# Patient Record
Sex: Female | Born: 1975 | Race: Black or African American | Hispanic: No | Marital: Married | Smoking: Never smoker
Health system: Southern US, Community
[De-identification: ages and names within clinical notes are randomized; demographics above are authoritative.]

## PROBLEM LIST (undated history)

## (undated) DIAGNOSIS — R809 Proteinuria, unspecified: Secondary | ICD-10-CM

## (undated) DIAGNOSIS — I10 Essential (primary) hypertension: Secondary | ICD-10-CM

## (undated) DIAGNOSIS — N939 Abnormal uterine and vaginal bleeding, unspecified: Secondary | ICD-10-CM

## (undated) DIAGNOSIS — D259 Leiomyoma of uterus, unspecified: Secondary | ICD-10-CM

## (undated) DIAGNOSIS — E7800 Pure hypercholesterolemia, unspecified: Secondary | ICD-10-CM

## (undated) DIAGNOSIS — I161 Hypertensive emergency: Secondary | ICD-10-CM

## (undated) DIAGNOSIS — E70319 Ocular albinism, unspecified: Secondary | ICD-10-CM

## (undated) DIAGNOSIS — Z8249 Family history of ischemic heart disease and other diseases of the circulatory system: Secondary | ICD-10-CM

## (undated) DIAGNOSIS — R519 Headache, unspecified: Secondary | ICD-10-CM

## (undated) DIAGNOSIS — J302 Other seasonal allergic rhinitis: Secondary | ICD-10-CM

## (undated) DIAGNOSIS — G4730 Sleep apnea, unspecified: Secondary | ICD-10-CM

## (undated) DIAGNOSIS — R51 Headache: Secondary | ICD-10-CM

## (undated) DIAGNOSIS — J339 Nasal polyp, unspecified: Secondary | ICD-10-CM

## (undated) DIAGNOSIS — Z973 Presence of spectacles and contact lenses: Secondary | ICD-10-CM

## (undated) DIAGNOSIS — E785 Hyperlipidemia, unspecified: Secondary | ICD-10-CM

## (undated) DIAGNOSIS — Z9109 Other allergy status, other than to drugs and biological substances: Secondary | ICD-10-CM

## (undated) DIAGNOSIS — G4733 Obstructive sleep apnea (adult) (pediatric): Secondary | ICD-10-CM

## (undated) DIAGNOSIS — R7303 Prediabetes: Secondary | ICD-10-CM

## (undated) DIAGNOSIS — J320 Chronic maxillary sinusitis: Secondary | ICD-10-CM

## (undated) DIAGNOSIS — J4520 Mild intermittent asthma, uncomplicated: Secondary | ICD-10-CM

## (undated) DIAGNOSIS — J3089 Other allergic rhinitis: Secondary | ICD-10-CM

## (undated) HISTORY — PX: LIVER BIOPSY: SHX301

## (undated) HISTORY — DX: Hypertensive emergency: I16.1

## (undated) HISTORY — DX: Pure hypercholesterolemia, unspecified: E78.00

---

## 1998-05-15 ENCOUNTER — Encounter: Admission: RE | Admit: 1998-05-15 | Discharge: 1998-05-15 | Payer: Self-pay | Admitting: Sports Medicine

## 1998-05-26 ENCOUNTER — Encounter: Admission: RE | Admit: 1998-05-26 | Discharge: 1998-05-26 | Payer: Self-pay | Admitting: Family Medicine

## 1998-06-03 ENCOUNTER — Encounter: Admission: RE | Admit: 1998-06-03 | Discharge: 1998-06-03 | Payer: Self-pay | Admitting: Family Medicine

## 1998-08-07 ENCOUNTER — Inpatient Hospital Stay (HOSPITAL_COMMUNITY): Admission: AD | Admit: 1998-08-07 | Discharge: 1998-08-07 | Payer: Self-pay | Admitting: Obstetrics & Gynecology

## 1998-08-08 ENCOUNTER — Inpatient Hospital Stay (HOSPITAL_COMMUNITY): Admission: AD | Admit: 1998-08-08 | Discharge: 1998-08-08 | Payer: Self-pay | Admitting: Obstetrics

## 1998-08-12 ENCOUNTER — Ambulatory Visit (HOSPITAL_COMMUNITY): Admission: RE | Admit: 1998-08-12 | Discharge: 1998-08-12 | Payer: Self-pay | Admitting: Obstetrics & Gynecology

## 1998-08-12 ENCOUNTER — Encounter: Admission: RE | Admit: 1998-08-12 | Discharge: 1998-08-12 | Payer: Self-pay | Admitting: Obstetrics & Gynecology

## 1998-08-18 ENCOUNTER — Encounter: Admission: RE | Admit: 1998-08-18 | Discharge: 1998-08-18 | Payer: Self-pay | Admitting: Sports Medicine

## 1998-08-19 ENCOUNTER — Encounter: Admission: RE | Admit: 1998-08-19 | Discharge: 1998-08-19 | Payer: Self-pay | Admitting: Obstetrics & Gynecology

## 1998-08-26 ENCOUNTER — Encounter: Admission: RE | Admit: 1998-08-26 | Discharge: 1998-08-26 | Payer: Self-pay | Admitting: Family Medicine

## 1998-09-01 ENCOUNTER — Ambulatory Visit (HOSPITAL_COMMUNITY): Admission: RE | Admit: 1998-09-01 | Discharge: 1998-09-01 | Payer: Self-pay | Admitting: Obstetrics and Gynecology

## 1998-09-02 ENCOUNTER — Encounter: Admission: RE | Admit: 1998-09-02 | Discharge: 1998-09-02 | Payer: Self-pay | Admitting: Family Medicine

## 1998-09-08 ENCOUNTER — Encounter: Admission: RE | Admit: 1998-09-08 | Discharge: 1998-09-08 | Payer: Self-pay | Admitting: Sports Medicine

## 1998-09-11 ENCOUNTER — Observation Stay (HOSPITAL_COMMUNITY): Admission: AD | Admit: 1998-09-11 | Discharge: 1998-09-12 | Payer: Self-pay | Admitting: Obstetrics

## 1998-09-15 ENCOUNTER — Encounter: Admission: RE | Admit: 1998-09-15 | Discharge: 1998-09-15 | Payer: Self-pay | Admitting: Sports Medicine

## 1998-09-28 ENCOUNTER — Ambulatory Visit (HOSPITAL_COMMUNITY): Admission: RE | Admit: 1998-09-28 | Discharge: 1998-09-28 | Payer: Self-pay | Admitting: Obstetrics & Gynecology

## 1998-09-28 ENCOUNTER — Encounter: Admission: RE | Admit: 1998-09-28 | Discharge: 1998-09-28 | Payer: Self-pay | Admitting: Sports Medicine

## 1998-10-01 ENCOUNTER — Encounter: Admission: RE | Admit: 1998-10-01 | Discharge: 1998-10-01 | Payer: Self-pay | Admitting: Family Medicine

## 1998-10-06 ENCOUNTER — Encounter (HOSPITAL_COMMUNITY): Admission: RE | Admit: 1998-10-06 | Discharge: 1998-11-02 | Payer: Self-pay | Admitting: *Deleted

## 1998-10-09 ENCOUNTER — Encounter: Admission: RE | Admit: 1998-10-09 | Discharge: 1998-10-09 | Payer: Self-pay | Admitting: Family Medicine

## 1998-10-20 ENCOUNTER — Encounter: Admission: RE | Admit: 1998-10-20 | Discharge: 1998-10-20 | Payer: Self-pay | Admitting: Family Medicine

## 1998-10-20 ENCOUNTER — Encounter: Payer: Self-pay | Admitting: *Deleted

## 1998-10-20 ENCOUNTER — Inpatient Hospital Stay (HOSPITAL_COMMUNITY): Admission: AD | Admit: 1998-10-20 | Discharge: 1998-10-22 | Payer: Self-pay | Admitting: *Deleted

## 1998-10-24 HISTORY — PX: TUBAL LIGATION: SHX77

## 1998-10-28 ENCOUNTER — Inpatient Hospital Stay (HOSPITAL_COMMUNITY): Admission: AD | Admit: 1998-10-28 | Discharge: 1998-11-02 | Payer: Self-pay | Admitting: Obstetrics

## 1998-12-15 ENCOUNTER — Encounter: Admission: RE | Admit: 1998-12-15 | Discharge: 1998-12-15 | Payer: Self-pay | Admitting: Sports Medicine

## 1999-01-07 ENCOUNTER — Other Ambulatory Visit: Admission: RE | Admit: 1999-01-07 | Discharge: 1999-01-07 | Payer: Self-pay | Admitting: *Deleted

## 1999-01-07 ENCOUNTER — Encounter: Admission: RE | Admit: 1999-01-07 | Discharge: 1999-01-07 | Payer: Self-pay | Admitting: Family Medicine

## 1999-04-13 ENCOUNTER — Encounter: Admission: RE | Admit: 1999-04-13 | Discharge: 1999-04-13 | Payer: Self-pay | Admitting: Family Medicine

## 1999-06-17 ENCOUNTER — Encounter: Admission: RE | Admit: 1999-06-17 | Discharge: 1999-06-17 | Payer: Self-pay | Admitting: Family Medicine

## 1999-08-21 ENCOUNTER — Encounter: Payer: Self-pay | Admitting: Emergency Medicine

## 1999-08-21 ENCOUNTER — Emergency Department (HOSPITAL_COMMUNITY): Admission: EM | Admit: 1999-08-21 | Discharge: 1999-08-21 | Payer: Self-pay | Admitting: Emergency Medicine

## 1999-08-23 ENCOUNTER — Encounter: Admission: RE | Admit: 1999-08-23 | Discharge: 1999-08-23 | Payer: Self-pay | Admitting: Family Medicine

## 1999-08-30 ENCOUNTER — Encounter: Admission: RE | Admit: 1999-08-30 | Discharge: 1999-08-30 | Payer: Self-pay | Admitting: Family Medicine

## 1999-12-02 ENCOUNTER — Emergency Department (HOSPITAL_COMMUNITY): Admission: EM | Admit: 1999-12-02 | Discharge: 1999-12-02 | Payer: Self-pay | Admitting: Emergency Medicine

## 2000-11-14 ENCOUNTER — Emergency Department (HOSPITAL_COMMUNITY): Admission: EM | Admit: 2000-11-14 | Discharge: 2000-11-14 | Payer: Self-pay | Admitting: Emergency Medicine

## 2001-01-09 ENCOUNTER — Encounter: Admission: RE | Admit: 2001-01-09 | Discharge: 2001-01-09 | Payer: Self-pay | Admitting: Family Medicine

## 2001-02-09 ENCOUNTER — Emergency Department (HOSPITAL_COMMUNITY): Admission: EM | Admit: 2001-02-09 | Discharge: 2001-02-09 | Payer: Self-pay | Admitting: Emergency Medicine

## 2001-02-27 ENCOUNTER — Encounter: Admission: RE | Admit: 2001-02-27 | Discharge: 2001-02-27 | Payer: Self-pay | Admitting: Family Medicine

## 2001-02-27 ENCOUNTER — Other Ambulatory Visit: Admission: RE | Admit: 2001-02-27 | Discharge: 2001-02-27 | Payer: Self-pay | Admitting: Obstetrics & Gynecology

## 2001-02-27 ENCOUNTER — Ambulatory Visit (HOSPITAL_COMMUNITY): Admission: RE | Admit: 2001-02-27 | Discharge: 2001-02-27 | Payer: Self-pay | Admitting: Family Medicine

## 2001-05-18 ENCOUNTER — Encounter: Admission: RE | Admit: 2001-05-18 | Discharge: 2001-05-18 | Payer: Self-pay | Admitting: Family Medicine

## 2001-06-15 ENCOUNTER — Encounter: Admission: RE | Admit: 2001-06-15 | Discharge: 2001-06-15 | Payer: Self-pay | Admitting: Family Medicine

## 2001-08-27 ENCOUNTER — Encounter: Admission: RE | Admit: 2001-08-27 | Discharge: 2001-08-27 | Payer: Self-pay | Admitting: Family Medicine

## 2001-08-27 ENCOUNTER — Other Ambulatory Visit: Admission: RE | Admit: 2001-08-27 | Discharge: 2001-08-27 | Payer: Self-pay | Admitting: Family Medicine

## 2001-08-27 ENCOUNTER — Encounter (INDEPENDENT_AMBULATORY_CARE_PROVIDER_SITE_OTHER): Payer: Self-pay

## 2002-02-07 ENCOUNTER — Encounter: Admission: RE | Admit: 2002-02-07 | Discharge: 2002-02-07 | Payer: Self-pay | Admitting: Family Medicine

## 2002-03-04 ENCOUNTER — Encounter: Admission: RE | Admit: 2002-03-04 | Discharge: 2002-03-04 | Payer: Self-pay | Admitting: Family Medicine

## 2002-03-05 ENCOUNTER — Encounter: Admission: RE | Admit: 2002-03-05 | Discharge: 2002-03-05 | Payer: Self-pay | Admitting: Family Medicine

## 2002-03-20 ENCOUNTER — Encounter: Admission: RE | Admit: 2002-03-20 | Discharge: 2002-03-20 | Payer: Self-pay | Admitting: Family Medicine

## 2002-05-09 ENCOUNTER — Encounter: Admission: RE | Admit: 2002-05-09 | Discharge: 2002-05-09 | Payer: Self-pay | Admitting: Family Medicine

## 2002-06-20 ENCOUNTER — Encounter: Admission: RE | Admit: 2002-06-20 | Discharge: 2002-06-20 | Payer: Self-pay | Admitting: Family Medicine

## 2003-01-16 ENCOUNTER — Encounter: Admission: RE | Admit: 2003-01-16 | Discharge: 2003-01-16 | Payer: Self-pay | Admitting: Family Medicine

## 2003-01-27 ENCOUNTER — Emergency Department (HOSPITAL_COMMUNITY): Admission: EM | Admit: 2003-01-27 | Discharge: 2003-01-27 | Payer: Self-pay | Admitting: Emergency Medicine

## 2003-02-14 ENCOUNTER — Encounter: Admission: RE | Admit: 2003-02-14 | Discharge: 2003-02-14 | Payer: Self-pay | Admitting: Family Medicine

## 2003-04-21 ENCOUNTER — Encounter: Admission: RE | Admit: 2003-04-21 | Discharge: 2003-04-21 | Payer: Self-pay | Admitting: Sports Medicine

## 2003-04-21 ENCOUNTER — Other Ambulatory Visit: Admission: RE | Admit: 2003-04-21 | Discharge: 2003-04-21 | Payer: Self-pay | Admitting: Family Medicine

## 2003-04-21 ENCOUNTER — Encounter (INDEPENDENT_AMBULATORY_CARE_PROVIDER_SITE_OTHER): Payer: Self-pay | Admitting: *Deleted

## 2003-05-08 ENCOUNTER — Encounter: Payer: Self-pay | Admitting: Sports Medicine

## 2003-05-08 ENCOUNTER — Encounter: Admission: RE | Admit: 2003-05-08 | Discharge: 2003-05-08 | Payer: Self-pay | Admitting: Sports Medicine

## 2003-05-13 ENCOUNTER — Encounter: Admission: RE | Admit: 2003-05-13 | Discharge: 2003-05-13 | Payer: Self-pay | Admitting: Family Medicine

## 2003-05-19 ENCOUNTER — Ambulatory Visit (HOSPITAL_COMMUNITY): Admission: RE | Admit: 2003-05-19 | Discharge: 2003-05-19 | Payer: Self-pay | Admitting: *Deleted

## 2003-07-29 ENCOUNTER — Encounter (INDEPENDENT_AMBULATORY_CARE_PROVIDER_SITE_OTHER): Payer: Self-pay | Admitting: *Deleted

## 2003-07-29 ENCOUNTER — Other Ambulatory Visit: Admission: RE | Admit: 2003-07-29 | Discharge: 2003-07-29 | Payer: Self-pay | Admitting: Family Medicine

## 2003-07-29 ENCOUNTER — Encounter: Admission: RE | Admit: 2003-07-29 | Discharge: 2003-07-29 | Payer: Self-pay | Admitting: Family Medicine

## 2003-07-29 ENCOUNTER — Encounter (INDEPENDENT_AMBULATORY_CARE_PROVIDER_SITE_OTHER): Payer: Self-pay | Admitting: Specialist

## 2003-10-21 ENCOUNTER — Encounter: Admission: RE | Admit: 2003-10-21 | Discharge: 2003-10-21 | Payer: Self-pay | Admitting: Family Medicine

## 2003-12-17 ENCOUNTER — Encounter: Admission: RE | Admit: 2003-12-17 | Discharge: 2003-12-17 | Payer: Self-pay | Admitting: Family Medicine

## 2004-03-24 ENCOUNTER — Encounter: Admission: RE | Admit: 2004-03-24 | Discharge: 2004-03-24 | Payer: Self-pay | Admitting: Family Medicine

## 2004-03-25 ENCOUNTER — Encounter: Admission: RE | Admit: 2004-03-25 | Discharge: 2004-03-25 | Payer: Self-pay | Admitting: Sports Medicine

## 2004-08-12 ENCOUNTER — Ambulatory Visit: Payer: Self-pay | Admitting: Family Medicine

## 2004-10-13 ENCOUNTER — Ambulatory Visit: Payer: Self-pay | Admitting: Sports Medicine

## 2005-04-11 ENCOUNTER — Emergency Department (HOSPITAL_COMMUNITY): Admission: EM | Admit: 2005-04-11 | Discharge: 2005-04-11 | Payer: Self-pay | Admitting: Emergency Medicine

## 2005-06-02 ENCOUNTER — Emergency Department (HOSPITAL_COMMUNITY): Admission: EM | Admit: 2005-06-02 | Discharge: 2005-06-02 | Payer: Self-pay | Admitting: Family Medicine

## 2005-06-10 ENCOUNTER — Ambulatory Visit: Payer: Self-pay | Admitting: Family Medicine

## 2005-07-01 ENCOUNTER — Ambulatory Visit: Payer: Self-pay | Admitting: Family Medicine

## 2005-07-07 ENCOUNTER — Ambulatory Visit (HOSPITAL_COMMUNITY): Admission: RE | Admit: 2005-07-07 | Discharge: 2005-07-07 | Payer: Self-pay | Admitting: Sports Medicine

## 2005-11-04 ENCOUNTER — Ambulatory Visit: Payer: Self-pay | Admitting: Sports Medicine

## 2005-11-25 ENCOUNTER — Ambulatory Visit: Payer: Self-pay | Admitting: Family Medicine

## 2005-11-30 ENCOUNTER — Encounter (INDEPENDENT_AMBULATORY_CARE_PROVIDER_SITE_OTHER): Payer: Self-pay | Admitting: *Deleted

## 2005-11-30 LAB — CONVERTED CEMR LAB

## 2005-12-09 ENCOUNTER — Ambulatory Visit: Payer: Self-pay | Admitting: Sports Medicine

## 2006-03-12 ENCOUNTER — Emergency Department (HOSPITAL_COMMUNITY): Admission: EM | Admit: 2006-03-12 | Discharge: 2006-03-12 | Payer: Self-pay | Admitting: Family Medicine

## 2006-03-15 ENCOUNTER — Emergency Department (HOSPITAL_COMMUNITY): Admission: EM | Admit: 2006-03-15 | Discharge: 2006-03-15 | Payer: Self-pay | Admitting: Emergency Medicine

## 2006-05-09 ENCOUNTER — Ambulatory Visit: Payer: Self-pay | Admitting: Sports Medicine

## 2006-06-11 ENCOUNTER — Emergency Department (HOSPITAL_COMMUNITY): Admission: AD | Admit: 2006-06-11 | Discharge: 2006-06-11 | Payer: Self-pay | Admitting: Family Medicine

## 2006-07-17 ENCOUNTER — Ambulatory Visit: Payer: Self-pay | Admitting: Family Medicine

## 2006-07-19 ENCOUNTER — Ambulatory Visit: Payer: Self-pay | Admitting: Family Medicine

## 2006-09-28 ENCOUNTER — Ambulatory Visit: Payer: Self-pay | Admitting: Sports Medicine

## 2006-10-20 ENCOUNTER — Ambulatory Visit: Payer: Self-pay | Admitting: Family Medicine

## 2006-12-21 DIAGNOSIS — I10 Essential (primary) hypertension: Secondary | ICD-10-CM | POA: Insufficient documentation

## 2006-12-22 ENCOUNTER — Encounter (INDEPENDENT_AMBULATORY_CARE_PROVIDER_SITE_OTHER): Payer: Self-pay | Admitting: *Deleted

## 2007-03-22 ENCOUNTER — Telehealth: Payer: Self-pay | Admitting: *Deleted

## 2007-03-22 ENCOUNTER — Ambulatory Visit: Payer: Self-pay | Admitting: Family Medicine

## 2007-03-24 ENCOUNTER — Emergency Department (HOSPITAL_COMMUNITY): Admission: EM | Admit: 2007-03-24 | Discharge: 2007-03-24 | Payer: Self-pay | Admitting: Family Medicine

## 2007-03-25 ENCOUNTER — Emergency Department (HOSPITAL_COMMUNITY): Admission: EM | Admit: 2007-03-25 | Discharge: 2007-03-25 | Payer: Self-pay | Admitting: Emergency Medicine

## 2007-03-26 ENCOUNTER — Telehealth (INDEPENDENT_AMBULATORY_CARE_PROVIDER_SITE_OTHER): Payer: Self-pay | Admitting: Family Medicine

## 2007-04-17 ENCOUNTER — Ambulatory Visit: Payer: Self-pay | Admitting: Sports Medicine

## 2007-12-13 ENCOUNTER — Encounter (INDEPENDENT_AMBULATORY_CARE_PROVIDER_SITE_OTHER): Payer: Self-pay | Admitting: Family Medicine

## 2007-12-13 ENCOUNTER — Ambulatory Visit: Payer: Self-pay | Admitting: Family Medicine

## 2007-12-13 LAB — CONVERTED CEMR LAB
Chlamydia, DNA Probe: NEGATIVE
GC Probe Amp, Genital: NEGATIVE
Whiff Test: POSITIVE

## 2007-12-14 ENCOUNTER — Telehealth (INDEPENDENT_AMBULATORY_CARE_PROVIDER_SITE_OTHER): Payer: Self-pay | Admitting: Family Medicine

## 2007-12-19 ENCOUNTER — Encounter (INDEPENDENT_AMBULATORY_CARE_PROVIDER_SITE_OTHER): Payer: Self-pay | Admitting: Family Medicine

## 2008-01-03 ENCOUNTER — Telehealth (INDEPENDENT_AMBULATORY_CARE_PROVIDER_SITE_OTHER): Payer: Self-pay | Admitting: Family Medicine

## 2008-02-04 ENCOUNTER — Telehealth (INDEPENDENT_AMBULATORY_CARE_PROVIDER_SITE_OTHER): Payer: Self-pay | Admitting: Family Medicine

## 2008-05-08 ENCOUNTER — Telehealth (INDEPENDENT_AMBULATORY_CARE_PROVIDER_SITE_OTHER): Payer: Self-pay | Admitting: Family Medicine

## 2008-09-09 ENCOUNTER — Observation Stay (HOSPITAL_COMMUNITY): Admission: EM | Admit: 2008-09-09 | Discharge: 2008-09-10 | Payer: Self-pay | Admitting: Emergency Medicine

## 2008-09-24 ENCOUNTER — Ambulatory Visit: Payer: Self-pay | Admitting: Family Medicine

## 2008-09-24 DIAGNOSIS — Z9189 Other specified personal risk factors, not elsewhere classified: Secondary | ICD-10-CM | POA: Insufficient documentation

## 2008-11-11 ENCOUNTER — Encounter (INDEPENDENT_AMBULATORY_CARE_PROVIDER_SITE_OTHER): Payer: Self-pay | Admitting: Family Medicine

## 2008-11-11 ENCOUNTER — Ambulatory Visit: Payer: Self-pay | Admitting: Family Medicine

## 2008-11-12 LAB — CONVERTED CEMR LAB
AST: 13 units/L (ref 0–37)
Albumin: 4.2 g/dL (ref 3.5–5.2)
CO2: 20 meq/L (ref 19–32)
Calcium: 8.8 mg/dL (ref 8.4–10.5)
Total Bilirubin: 0.2 mg/dL — ABNORMAL LOW (ref 0.3–1.2)
Total Protein: 7 g/dL (ref 6.0–8.3)

## 2008-12-21 ENCOUNTER — Emergency Department (HOSPITAL_COMMUNITY): Admission: EM | Admit: 2008-12-21 | Discharge: 2008-12-21 | Payer: Self-pay | Admitting: Emergency Medicine

## 2009-02-06 ENCOUNTER — Ambulatory Visit: Payer: Self-pay | Admitting: Family Medicine

## 2009-02-06 DIAGNOSIS — M25569 Pain in unspecified knee: Secondary | ICD-10-CM | POA: Insufficient documentation

## 2009-02-06 DIAGNOSIS — S7010XA Contusion of unspecified thigh, initial encounter: Secondary | ICD-10-CM | POA: Insufficient documentation

## 2009-05-04 ENCOUNTER — Telehealth: Payer: Self-pay | Admitting: Family Medicine

## 2009-05-21 ENCOUNTER — Ambulatory Visit: Payer: Self-pay | Admitting: Family Medicine

## 2009-05-21 DIAGNOSIS — E663 Overweight: Secondary | ICD-10-CM | POA: Insufficient documentation

## 2009-05-21 DIAGNOSIS — D4959 Neoplasm of unspecified behavior of other genitourinary organ: Secondary | ICD-10-CM | POA: Insufficient documentation

## 2009-05-22 ENCOUNTER — Encounter: Payer: Self-pay | Admitting: *Deleted

## 2009-06-01 ENCOUNTER — Ambulatory Visit: Payer: Self-pay | Admitting: Family Medicine

## 2009-07-27 ENCOUNTER — Encounter: Payer: Self-pay | Admitting: Family Medicine

## 2009-07-27 ENCOUNTER — Telehealth: Payer: Self-pay | Admitting: Family Medicine

## 2009-07-27 ENCOUNTER — Ambulatory Visit: Payer: Self-pay | Admitting: Family Medicine

## 2009-07-27 DIAGNOSIS — N751 Abscess of Bartholin's gland: Secondary | ICD-10-CM | POA: Insufficient documentation

## 2009-07-28 ENCOUNTER — Ambulatory Visit: Payer: Self-pay | Admitting: Family Medicine

## 2009-07-29 ENCOUNTER — Encounter: Payer: Self-pay | Admitting: *Deleted

## 2009-08-21 ENCOUNTER — Ambulatory Visit: Payer: Self-pay | Admitting: Family Medicine

## 2010-03-12 ENCOUNTER — Telehealth: Payer: Self-pay | Admitting: Family Medicine

## 2010-03-16 ENCOUNTER — Ambulatory Visit: Payer: Self-pay | Admitting: Family Medicine

## 2010-03-16 ENCOUNTER — Telehealth: Payer: Self-pay | Admitting: Family Medicine

## 2010-03-16 ENCOUNTER — Ambulatory Visit (HOSPITAL_COMMUNITY): Admission: RE | Admit: 2010-03-16 | Discharge: 2010-03-16 | Payer: Self-pay | Admitting: Family Medicine

## 2010-03-16 ENCOUNTER — Encounter: Payer: Self-pay | Admitting: Family Medicine

## 2010-03-16 DIAGNOSIS — R51 Headache: Secondary | ICD-10-CM | POA: Insufficient documentation

## 2010-03-16 DIAGNOSIS — R519 Headache, unspecified: Secondary | ICD-10-CM | POA: Insufficient documentation

## 2010-03-16 LAB — CONVERTED CEMR LAB
Chloride: 101 meq/L (ref 96–112)
Potassium: 3.3 meq/L — ABNORMAL LOW (ref 3.5–5.3)

## 2010-03-17 ENCOUNTER — Encounter: Payer: Self-pay | Admitting: Family Medicine

## 2010-04-15 ENCOUNTER — Telehealth: Payer: Self-pay | Admitting: Family Medicine

## 2010-04-16 ENCOUNTER — Emergency Department (HOSPITAL_COMMUNITY): Admission: EM | Admit: 2010-04-16 | Discharge: 2010-04-16 | Payer: Self-pay | Admitting: Family Medicine

## 2010-07-25 ENCOUNTER — Encounter: Payer: Self-pay | Admitting: Family Medicine

## 2010-08-09 ENCOUNTER — Ambulatory Visit: Payer: Self-pay | Admitting: Family Medicine

## 2010-08-26 ENCOUNTER — Encounter: Payer: Self-pay | Admitting: Family Medicine

## 2010-08-26 ENCOUNTER — Ambulatory Visit: Payer: Self-pay | Admitting: Obstetrics & Gynecology

## 2010-11-25 NOTE — Assessment & Plan Note (Signed)
Summary: cyst/[place]   Vital Signs:  Patient profile:   35 year old female Weight:      150.3 pounds BMI:     28.97 Temp:     97.6 degrees F Pulse rate:   74 / minute BP sitting:   148 / 104  (left arm)  Vitals Entered By: Starleen Blue RN (July 27, 2009 10:13 AM) CC: cyst Is Patient Diabetic? Yes  Pain Assessment Patient in pain? yes     Location: vaginal Intensity: 7   Primary Care Provider:  Ardeen Garland  MD  CC:  cyst.  History of Present Illness: Jillian Vasquez is a 35 year old female presenting with c/o of:  1. Vaginal Cyst: on her left inner labia minora, intermittent, seems to occur mid-menstrual cycle and lasts approx 3 days, then usually completely resolves.  this is the 4th episode this year. she states that wearing scented maxi pads and using fragrant bath soap seems to make the area worse.  motrin and using summer's eve bath wash seems to help.  today, the lesion is painful and she is unable to sit normally, it has been tender for 2-3 days.   Habits & Providers  Alcohol-Tobacco-Diet     Tobacco Status: never  Current Medications (verified): 1)  Hydrochlorothiazide 25 Mg Tabs (Hydrochlorothiazide) .... Take 1 Tablet By Mouth Once A Day 2)  Norvasc 10 Mg Tabs (Amlodipine Besylate) .... Take 1 Tablet By Mouth Once A Day 3)  Tramadol Hcl 50 Mg  Tabs (Tramadol Hcl) .... One By Mouth Q 4-6 Hours As Needed Pain  Allergies (verified): No Known Drug Allergies PMH-FH-SH reviewed for relevance  Review of Systems GU:  Denies discharge, dysuria, genital sores, hematuria, incontinence, urinary frequency, and urinary hesitancy.  Physical Exam  General:  Well-developed,well-nourished,in no acute distress; alert,appropriate and cooperative throughout examination. Vitals reviewed. Genitalia:  2-3 cm red, painful to palpation, fluctuant mass on left labia minora c/w bartholin gland cyst/abscess           Impression & Recommendations:  Problem # 1:  BARTHOLIN'S CYST  ABSCESS (ICD-616.3) Assessment New  Word catheter insertion: procedure discussed with patient including benefits (draining pus and providing relief) and risks (bleeding, infection). Consent form obtained. No allergies endorsed. Patient placed in dorsal lithotomy position. Area cleansed with betadine. Local anesthesia was obtained using 2 cc 1% lidocaine without epi.  ~5 mm incision made medial side of cyst. green-yellow pus released. word cath placed in usual manner into cyst cavity, filled with H2O to allow retention of ballon. the patient tolerated the procedure well. aftercare instructions given along with pain medication. f/u 2 days. refer to GYN for more permanent fix.  Orders: I&D Abcess, simple- [facility] (10060) [facility]- Est  Level 4 (04540)  Problem # 2:  HYPERTENSION, BENIGN SYSTEMIC (ICD-401.1) Assessment: Unchanged  No change in medication today. Patient in pain. Will f/u with PCP. Her updated medication list for this problem includes:    Hydrochlorothiazide 25 Mg Tabs (Hydrochlorothiazide) .Marland Kitchen... Take 1 tablet by mouth once a day    Norvasc 10 Mg Tabs (Amlodipine besylate) .Marland Kitchen... Take 1 tablet by mouth once a day  Orders: [facility]- Est  Level 4 (98119)  Complete Medication List: 1)  Hydrochlorothiazide 25 Mg Tabs (Hydrochlorothiazide) .... Take 1 tablet by mouth once a day 2)  Norvasc 10 Mg Tabs (Amlodipine besylate) .... Take 1 tablet by mouth once a day 3)  Tramadol Hcl 50 Mg Tabs (Tramadol hcl) .... One by mouth q 4-6 hours as  needed pain  Patient Instructions: 1)  It was nice to meet you today. 2)  Please follow up in 2 days to see if the catheter may be removed. 3)  You may shower daily as usual.  4)  Use a maxi-pad since there will be discharge from the catheter. Prescriptions: TRAMADOL HCL 50 MG  TABS (TRAMADOL HCL) one by mouth q 4-6 hours as needed pain  #30 x 0   Entered and Authorized by:   Helane Rima MD   Signed by:   Helane Rima MD on 07/27/2009   Method used:   Print  then Give to Patient   RxID:   [id number] PERCOCET 5-325 MG TABS (OXYCODONE-ACETAMINOPHEN) one by mouth q 4-6 hours as needed pain  #30 x 0   Entered and Authorized by:   Helane Rima MD   Signed by:   Helane Rima MD on 07/27/2009   Method used:   Print then Give to Patient   RxID:   [id number]   Prevention & Chronic Care Immunizations   Influenza vaccine: Not documented    Tetanus booster: 11/24/2002: Done.   Tetanus booster due: 11/24/2012    Pneumococcal vaccine: Not documented  Other Screening   Pap smear: normal  (12/13/2007)   Pap smear due: 12/12/2008   Smoking status: never  (07/27/2009)  Hypertension   Last Blood Pressure: 148 / 104  (07/27/2009)   Serum creatinine: 0.71  (11/11/2008)   Serum potassium 4.5  (11/11/2008)    Hypertension flowsheet reviewed?: Yes   Progress toward BP goal: Unchanged  Self-Management Support :   Personal Goals (by the next clinic visit) :      Personal blood pressure goal: 140/90  (07/27/2009)   Patient will work on the following items until the next clinic visit to reach self-care goals:     Medications and monitoring: take my medicines every day  (07/27/2009)    Hypertension self-management support: Written self-care plan  (07/27/2009)   Hypertension self-care plan printed.

## 2010-11-25 NOTE — Miscellaneous (Signed)
Summary: problem list update  Clinical Lists Changes  Problems: Removed problem of EXERCISE INDUCED ASTHMA (ICD-493.81)      Allergies: No Known Drug Allergies

## 2010-11-25 NOTE — Progress Notes (Signed)
Summary: triage  Phone Note Call from Patient Call back at Home Phone [phone]   Caller: Patient Summary of Call: Pt has outbreak of shingles. Initial call taken by: Clydell Hakim,  April 15, 2010 4:39 PM  Follow-up for Phone Call        hx of shingles per pt. states she went to [facility] last yr. located on back of leg. told her no appt now. asked her about appt tomorrow. she wants to go back to UC. told her ok Follow-up by: Golden Circle RN,  April 15, 2010 4:41 PM
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
## 2010-11-25 NOTE — Progress Notes (Signed)
Summary: triage  Phone Note Call from Patient Call back at Home Phone [phone]   Caller: Patient Summary of Call: Pt got generic of Norvas and usually gets name brand.  Not only is this making her feel bad the price was more expensive getting the generic.   Initial call taken by: Clydell Hakim,  Mar 16, 2010 9:40 AM  Follow-up for Phone Call        to pcp  Follow-up by: Golden Circle RN,  Mar 16, 2010 10:03 AM  Additional Follow-up for Phone Call Additional follow up Details #1::        Not sure why that is.  Most likely should have been getting generic all along as I never checked brand medically necessary.  Also, is on discount list at [facility] and [facility] but not [facility].  I can resend it to another pharmacy once but she needs appt anywway before more refills.  Additional Follow-up by: Lamar Laundry, MD May 24th, 2011    Additional Follow-up for Phone Call Additional follow up Details #2::    she is not taking that med because of the way it makes her feel. appt at 1:30  today with Dr. Lelon Perla for HTN & meds Follow-up by: Golden Circle RN,  Mar 16, 2010 11:07 AM
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
## 2010-11-25 NOTE — Progress Notes (Signed)
Summary: refill  Phone Note Refill Request Call back at Home Phone [phone] Message from:  Patient  Refills Requested: Medication #1:  NORVASC 10 MG TABS Take 1 tablet by mouth once a day   Supply Requested: 3 months  Medication #2:  HYDROCHLOROTHIAZIDE 25 MG TABS Take 1 tablet by mouth once a day   Supply Requested: 3 months [facility] [address]  Initial call taken by: De Nurse,  Mar 12, 2010 2:23 PM  Follow-up for Phone Call        Sent but patient needs appointment for BP check before further refills can be sent.  Follow-up by: Lamar Laundry, MD May 20th, 2011  Additional Follow-up for Phone Call Additional follow up Details #1::        spoke w/ pt and she will call Monday to make appt. thank you! Additional Follow-up by: De Nurse,  Mar 12, 2010 2:32 PM    Prescriptions: HYDROCHLOROTHIAZIDE 25 MG TABS (HYDROCHLOROTHIAZIDE) Take 1 tablet by mouth once a day  #31 x 1   Entered and Authorized by:   Ardeen Garland  MD   Signed by:   Ardeen Garland  MD on 03/12/2010   Method used:   Electronically to        [facility] [phone]* (retail)       [address]       [place], [place]  [address]       Ph: [phone]       Fax: [phone]   RxID:   [id number] NORVASC 10 MG TABS (AMLODIPINE BESYLATE) Take 1 tablet by mouth once a day  #31 x 1   Entered and Authorized by:   Ardeen Garland  MD   Signed by:   Ardeen Garland  MD on 03/12/2010   Method used:   Electronically to        [facility] [phone]* (retail)       [address]       [place], [place]  [address]       Ph: [phone]       Fax: [phone]   RxID:   [id number]

## 2010-11-25 NOTE — Consult Note (Signed)
Summary: [facility] - clinic note  [facility] - clinic note   Imported By: De Nurse 11/19/2010 15:39:43  _____________________________________________________________________  External Attachment:    Type:   Image     Comment:   External Document

## 2010-11-30 ENCOUNTER — Encounter: Payer: Self-pay | Admitting: *Deleted

## 2011-02-24 ENCOUNTER — Ambulatory Visit (INDEPENDENT_AMBULATORY_CARE_PROVIDER_SITE_OTHER): Payer: Self-pay | Admitting: Family Medicine

## 2011-02-24 ENCOUNTER — Encounter: Payer: Self-pay | Admitting: Family Medicine

## 2011-02-24 VITALS — BP 190/100 | HR 82 | Temp 98.3°F | Ht 61.0 in | Wt 150.6 lb

## 2011-02-24 DIAGNOSIS — I10 Essential (primary) hypertension: Secondary | ICD-10-CM

## 2011-02-24 DIAGNOSIS — J40 Bronchitis, not specified as acute or chronic: Secondary | ICD-10-CM | POA: Insufficient documentation

## 2011-02-24 MED ORDER — ALBUTEROL 90 MCG/ACT IN AERS: 2.0000 | INHALATION_SPRAY | RESPIRATORY_TRACT | Status: DC | PRN

## 2011-02-24 MED ORDER — DOXYCYCLINE HYCLATE 100 MG PO TABS: 100.0000 mg | ORAL_TABLET | Freq: Two times a day (BID) | ORAL | Status: AC

## 2011-02-24 MED ORDER — CLONIDINE HCL 0.1 MG PO TABS
0.2000 mg | ORAL_TABLET | Freq: Once | ORAL | Status: AC
  Administered 2011-02-24: 0.2 mg via ORAL

## 2011-02-24 MED ORDER — LISINOPRIL-HYDROCHLOROTHIAZIDE 20-25 MG PO TABS: 1.0000 | ORAL_TABLET | Freq: Every day | ORAL | Status: DC

## 2011-02-24 NOTE — Assessment & Plan Note (Addendum)
Will start pt on HCTZ/lisinopril combo, needs two meds  RTC for  recheck on Monday Given Clonidine during visit, pt assymptmatic today, will allow outpatient management, see above

## 2011-02-24 NOTE — Progress Notes (Signed)
  Subjective:    Patient ID: Jillian Vasquez, female    DOB: [birth year], 35 y.o.   MRN: [id number]  HPI  Productive cough x 2 week, green sputum, moving around the house has wheezing and feels SOB, in the past had episodes of wheezing but this resolved, non smoker   No sore throat, occ allergies, subjective fever and chills, denies nausea, vomiting, no abd pain, has pleuritic chest pain at night with coughing spells. Taking HP coricidin   No sick contacts   HTN- BP running high  150-160s/ 110-120 for the past few weeks- Lisinpril only, was told HCTZ was discontinued  Occ HA, non currently, no change in vision  Review of Systems per above     Objective:   Physical Exam GEN- NAD, alert and oriented  HEENT- Fundoscopic exam benign, perrl, EOMI, oropharynx clear  CVS- RRR, no murmur  RESP- decreased left base compared to right, normal WOB, no wheeze, no rhonchi       Assessment & Plan:

## 2011-02-24 NOTE — Patient Instructions (Signed)
Start the antibiotic doxycyline- take 1 tablet in AM and 1 in pm for 7 days Use the inhaler as needed  For SOB  for your blood pressure- start the combination pill of lisinopril and HCTZ Return for a recheck on Monday with the work-in doc or Dr. Edmonia James, or myself

## 2011-02-24 NOTE — Assessment & Plan Note (Addendum)
Will treat prolonged symptoms for bronchitis esp with wheezing associated , likley started as viral illness, works as Oncologist Doxy x 7 days- needs meds on discount list Albuterol inhaler

## 2011-03-01 ENCOUNTER — Ambulatory Visit: Payer: Self-pay | Admitting: *Deleted

## 2011-03-01 VITALS — BP 165/105 | HR 71

## 2011-03-01 DIAGNOSIS — I10 Essential (primary) hypertension: Secondary | ICD-10-CM
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
## 2011-07-16 ENCOUNTER — Telehealth: Payer: Self-pay | Admitting: Family Medicine

## 2011-07-16 NOTE — Telephone Encounter (Signed)
Patient has had runny nose, colored nasal discharge, and sneezing 1-2 weeks.  But no cough, difficulty breathing, fevers, nausea/vomiting.   Wondering what she could take for her symptoms with her history of hypertension.   Has tried decongestant (?Alka-Seltzer plus). Helped some but still persistent symptoms.   Seems to be having seasonal allergies. Does not appear to have infection. Recommended OTC anti-histamine Allegra or Claritin. If symptoms not improved, recommend following-up at clinic.

## 2011-07-26 LAB — POCT I-STAT, CHEM 8
Calcium, Ion: 1.15
Chloride: 102
Creatinine, Ser: 1.2
Glucose, Bld: 119 — ABNORMAL HIGH
HCT: 39

## 2011-07-26 LAB — SAMPLE TO BLOOD BANK

## 2011-07-26 LAB — URINALYSIS, ROUTINE W REFLEX MICROSCOPIC
Glucose, UA: NEGATIVE
Hgb urine dipstick: NEGATIVE
Protein, ur: NEGATIVE
Specific Gravity, Urine: >1.046 — ABNORMAL HIGH
pH: 6.5

## 2011-07-26 LAB — DIFFERENTIAL
Eosinophils Relative: 4
Lymphocytes Relative: 46
Lymphs Abs: 3
Monocytes Absolute: 0.5
Monocytes Relative: 7

## 2011-07-26 LAB — POCT PREGNANCY, URINE: Preg Test, Ur: NEGATIVE

## 2011-07-26 LAB — CBC
HCT: 36
Hemoglobin: 11.7 — ABNORMAL LOW
RBC: 5
WBC: 6.7

## 2011-08-11 LAB — POCT I-STAT CREATININE
Creatinine, Ser: 1.3 — ABNORMAL HIGH
Operator id: 277751

## 2011-08-11 LAB — I-STAT 8, (EC8 V) (CONVERTED LAB)
Acid-Base Excess: 2
Chloride: 103
Glucose, Bld: 99
pCO2, Ven: 44.2 — ABNORMAL LOW
pH, Ven: 7.405 — ABNORMAL HIGH

## 2011-10-05 ENCOUNTER — Ambulatory Visit (INDEPENDENT_AMBULATORY_CARE_PROVIDER_SITE_OTHER): Payer: PRIVATE HEALTH INSURANCE | Admitting: Family Medicine

## 2011-10-05 ENCOUNTER — Encounter: Payer: Self-pay | Admitting: Family Medicine

## 2011-10-05 VITALS — BP 168/102 | HR 76 | Temp 97.8°F | Ht 61.0 in | Wt 148.6 lb

## 2011-10-05 DIAGNOSIS — J019 Acute sinusitis, unspecified: Secondary | ICD-10-CM | POA: Insufficient documentation

## 2011-10-05 MED ORDER — AZITHROMYCIN 500 MG PO TABS: 500.0000 mg | ORAL_TABLET | Freq: Every day | ORAL | Status: AC

## 2011-10-05 NOTE — Progress Notes (Signed)
  Subjective:    Patient ID: Jillian Vasquez, female    DOB: August 07, 1976, 35 y.o.   MRN: [id number]  HPI Nasal congestion congestion and cough: Patient reports nasal congestion and sinus pressure and subjective fevers x3 weeks. Some improvement after her symptoms have been present x1 week and then had a worsening. Off and on subjective fevers. Off and on night sweats chills. X3 days has had cough and wheezing. Using albuterol inhaler every 4 hours. Continues to feel short of breath and wheezing. Has history of exercise-induced asthma. No sore throat. No nausea. No vomiting. No diarrhea. Eating well. Drinking well. Positive sick contacts.  Review of Systems As per above.    Objective:   Physical Exam  Constitutional: She is oriented to person, place, and time. She appears well-developed and well-nourished.  HENT:  Head: Normocephalic and atraumatic.       Erythematous/inflamed nasal mucosa. Clear nasal discharge. Mild tenderness with palpation over frontal and maxillary sinuses.  Eyes: Right eye exhibits no discharge. Left eye exhibits no discharge.  Neck: Normal range of motion. Neck supple.  Cardiovascular: Normal rate, regular rhythm and normal heart sounds.   No murmur heard. Pulmonary/Chest: Effort normal. No respiratory distress. She has wheezes (wheezing bilateral).  Musculoskeletal: She exhibits no edema.  Neurological: She is alert and oriented to person, place, and time.  Skin: No rash noted.  Psychiatric: She has a normal mood and affect. Her behavior is normal.          Assessment & Plan:

## 2011-10-05 NOTE — Assessment & Plan Note (Addendum)
Since patient reports subjective fevers, symptoms x3 weeks, nasal congestion, sinus pressure/pain, history of a second worsening-Will prescribe antibiotics at this time. Wheezing bilateral most likely do to asthma flare from URI. Antibiotics given for sinusitis will also cover for bacterial cause. Patient to use albuterol every 4 hours as needed. Patient to return for new or worsening symptoms.

## 2011-10-22 ENCOUNTER — Emergency Department (HOSPITAL_COMMUNITY)
Admission: EM | Admit: 2011-10-22 | Discharge: 2011-10-22 | Disposition: A | Discharge status: Home / Self Care | Payer: PRIVATE HEALTH INSURANCE | Attending: Emergency Medicine | Admitting: Emergency Medicine

## 2011-10-22 ENCOUNTER — Encounter (HOSPITAL_COMMUNITY): Payer: Self-pay | Admitting: *Deleted

## 2011-10-22 ENCOUNTER — Emergency Department (HOSPITAL_COMMUNITY): Payer: PRIVATE HEALTH INSURANCE

## 2011-10-22 DIAGNOSIS — R059 Cough, unspecified: Secondary | ICD-10-CM | POA: Insufficient documentation

## 2011-10-22 DIAGNOSIS — Z79899 Other long term (current) drug therapy: Secondary | ICD-10-CM | POA: Insufficient documentation

## 2011-10-22 DIAGNOSIS — R05 Cough: Secondary | ICD-10-CM | POA: Insufficient documentation

## 2011-10-22 DIAGNOSIS — J45901 Unspecified asthma with (acute) exacerbation: Secondary | ICD-10-CM

## 2011-10-22 DIAGNOSIS — I10 Essential (primary) hypertension: Secondary | ICD-10-CM | POA: Insufficient documentation

## 2011-10-22 DIAGNOSIS — J3489 Other specified disorders of nose and nasal sinuses: Secondary | ICD-10-CM | POA: Insufficient documentation

## 2011-10-22 HISTORY — DX: Essential (primary) hypertension: I10

## 2011-10-22 MED ORDER — ALBUTEROL SULFATE (5 MG/ML) 0.5% IN NEBU
INHALATION_SOLUTION | RESPIRATORY_TRACT | Status: AC
  Administered 2011-10-22: 5 mg via RESPIRATORY_TRACT
  Filled 2011-10-22: qty 1

## 2011-10-22 MED ORDER — ALBUTEROL SULFATE (5 MG/ML) 0.5% IN NEBU
5.0000 mg | INHALATION_SOLUTION | Freq: Once | RESPIRATORY_TRACT | Status: AC
  Administered 2011-10-22: 5 mg via RESPIRATORY_TRACT

## 2011-10-22 MED ORDER — ALBUTEROL SULFATE (5 MG/ML) 0.5% IN NEBU: 2.5000 mg | INHALATION_SOLUTION | Freq: Once | RESPIRATORY_TRACT | Status: DC

## 2011-10-22 MED ORDER — IPRATROPIUM BROMIDE 0.02 % IN SOLN
RESPIRATORY_TRACT | Status: AC
  Administered 2011-10-22: 0.5 mg via RESPIRATORY_TRACT
  Filled 2011-10-22: qty 2.5

## 2011-10-22 MED ORDER — PREDNISONE 20 MG PO TABS: 60.0000 mg | ORAL_TABLET | Freq: Once | ORAL | Status: DC

## 2011-10-22 MED ORDER — IPRATROPIUM BROMIDE 0.02 % IN SOLN: 0.5000 mg | Freq: Once | RESPIRATORY_TRACT | Status: DC

## 2011-10-22 MED ORDER — PREDNISONE 10 MG PO TABS: 20.0000 mg | ORAL_TABLET | Freq: Every day | ORAL | Status: DC

## 2011-10-22 MED ORDER — PREDNISONE 20 MG PO TABS
60.0000 mg | ORAL_TABLET | Freq: Once | ORAL | Status: AC
  Administered 2011-10-22: 60 mg via ORAL
  Filled 2011-10-22: qty 3

## 2011-10-22 MED ORDER — IPRATROPIUM BROMIDE 0.02 % IN SOLN
0.5000 mg | Freq: Once | RESPIRATORY_TRACT | Status: AC
  Administered 2011-10-22: 0.5 mg via RESPIRATORY_TRACT

## 2011-10-22 NOTE — ED Notes (Signed)
Pt began experiencing shortness of breath and asthma exacerbation approximately 1 hour prior to triage.  Pt noted to have a O2 saturation of 90-91 upon arrival with a HR of 120's.  Pt given albuterol and atrovent in triage after checking allergies.  O2 saturations immediately 98-99% with treatment.

## 2011-10-22 NOTE — ED Provider Notes (Addendum)
History     CSN: [id number]  Arrival date & time 10/22/11  2059   First MD Initiated Contact with Patient 10/22/11 2241      Chief Complaint  Patient presents with  . Asthma    (Consider location/radiation/quality/duration/timing/severity/associated sxs/prior treatment) HPI Comments: [name] has been diagnosed with asthma for the past 8 and has been using albuterol inhaler with relief up until 2 weeks ago, and she's found, that she's had to use it almost every 2 hours.  She has seen her primary care physician during this two-week period.  He just suggested she keep using her albuterol inhaler.  He did not place her on steroids.  She does report that she has URI without fever  Patient is a 35 y.o. female presenting with asthma. The history is provided by the patient.  Asthma This is a recurrent problem. The current episode started in the past 7 days. The problem occurs intermittently. The problem has been gradually worsening. Associated symptoms include congestion and coughing. Pertinent negatives include no chest pain, chills, diaphoresis, fever, headaches, myalgias, nausea, sore throat, vomiting or weakness. The symptoms are aggravated by exertion. The treatment provided no relief.    Past Medical History  Diagnosis Date  . Asthma   . Hypertension     History reviewed. No pertinent past surgical history.  History reviewed. No pertinent family history.  History  Substance Use Topics  . Smoking status: Never Smoker   . Smokeless tobacco: Not on file  . Alcohol Use: Not on file    OB History    Grav Para Term Preterm Abortions TAB SAB Ect Mult Living                  Review of Systems  Constitutional: Negative for fever, chills and diaphoresis.  HENT: Positive for congestion and rhinorrhea. Negative for sore throat.   Respiratory: Positive for cough, chest tightness, shortness of breath and wheezing.   Cardiovascular: Negative for chest pain.  Gastrointestinal:  Negative for nausea and vomiting.  Genitourinary: Negative for dysuria.  Musculoskeletal: Negative for myalgias.  Neurological: Negative for dizziness, weakness and headaches.  Hematological: Negative.   Psychiatric/Behavioral: Negative.     Allergies  Review of patient's allergies indicates no known allergies.  Home Medications   Current Outpatient Rx  Name Route Sig Dispense Refill  . ALBUTEROL 90 MCG/ACT IN AERS Inhalation Inhale 2 puffs into the lungs every 4 (four) hours as needed for wheezing. 17 g 12  . LISINOPRIL-HYDROCHLOROTHIAZIDE 20-25 MG PO TABS Oral Take 1 tablet by mouth daily. 30 tablet 11  . PREDNISONE 10 MG PO TABS Oral Take 2 tablets (20 mg total) by mouth daily. 15 tablet 0    BP 189/117  Pulse 88  Temp(Src) 98.7 F (37.1 C) (Oral)  Resp 20  SpO2 99%  LMP 10/01/2011  Physical Exam  Constitutional: She is oriented to person, place, and time. She appears well-developed and well-nourished.  HENT:  Head: Normocephalic.  Neck: Normal range of motion.  Cardiovascular: Tachycardia present.   Pulmonary/Chest: Tachypnea noted. No respiratory distress. She has no decreased breath sounds. She has wheezes in the right upper field, the right middle field, the left upper field and the left middle field. She has no rales. She exhibits no tenderness.  Abdominal: Soft.  Musculoskeletal: Normal range of motion.  Neurological: She is alert and oriented to person, place, and time.  Skin: Skin is warm and dry.    ED Course  Procedures (including critical  care time)  Labs Reviewed - No data to display No results found.   1. Asthma exacerbation   2. Hypertension, uncontrolled       MDM  Discussed at length use of albuterol inhaler.  Important fact that she should speak with her physician and report the frequency of use over the last several months.  We'll assess for pneumonia ordered steroid by mouth additional albuterol treatment        Arman Filter,  NP 10/22/11 2315  Arman Filter, NP 10/22/11 2343  Arman Filter, NP 10/27/11 1958

## 2011-10-22 NOTE — ED Provider Notes (Signed)
History     CSN: [id number]  Arrival date & time 10/22/11  2059   First MD Initiated Contact with Patient 10/22/11 2241      Chief Complaint  Patient presents with  . Asthma    (Consider location/radiation/quality/duration/timing/severity/associated sxs/prior treatment) HPI Patient with hsitory of cough and rhinnorhea for a month.  Using albuterol 4 times per day.  First episode of wheezing last year with uri.  No fever, weight loss, or vomiting.  Patient denies pregnancy with regular periods s/p btl.  Patient states she has not been taking her bp meds regularly due to being busy.   Past Medical History  Diagnosis Date  . Asthma   . Hypertension     History reviewed. No pertinent past surgical history.  History reviewed. No pertinent family history.  History  Substance Use Topics  . Smoking status: Never Smoker   . Smokeless tobacco: Not on file  . Alcohol Use: Not on file    OB History    Grav Para Term Preterm Abortions TAB SAB Ect Mult Living                  Review of Systems  All other systems reviewed and are negative.    Allergies  Review of patient's allergies indicates no known allergies.  Home Medications   Current Outpatient Rx  Name Route Sig Dispense Refill  . ALBUTEROL 90 MCG/ACT IN AERS Inhalation Inhale 2 puffs into the lungs every 4 (four) hours as needed for wheezing. 17 g 12  . LISINOPRIL-HYDROCHLOROTHIAZIDE 20-25 MG PO TABS Oral Take 1 tablet by mouth daily. 30 tablet 11    BP 195/119  Pulse 120  Temp(Src) 98.7 F (37.1 C) (Oral)  Resp 25  SpO2 100%  LMP 10/01/2011  Physical Exam  Nursing note and vitals reviewed. Constitutional: She is oriented to person, place, and time. She appears well-developed and well-nourished.       Patient hypertensive.  HENT:  Head: Normocephalic.  Eyes: Conjunctivae and EOM are normal. Pupils are equal, round, and reactive to light.  Neck: Normal range of motion. Neck supple.  Cardiovascular:  Normal rate and regular rhythm.   Pulmonary/Chest: Effort normal. No accessory muscle usage. Not tachypneic. No respiratory distress.       Few scattered rhonchi diffusely and some expiratory wheezing.  Abdominal: Soft. Normal appearance and bowel sounds are normal.  Musculoskeletal: Normal range of motion. She exhibits no edema and no tenderness.  Neurological: She is alert and oriented to person, place, and time.  Skin: Skin is warm and dry. She is not diaphoretic.  Psychiatric: She has a normal mood and affect.    ED Course  Procedures (including critical care time)  Labs Reviewed - No data to display No results found.   No diagnosis found.    MDM  Plan albuterol and atrovent here.  Will rx refill for neb and prednisone.  Patient's hr 90 here.  Patient instructed regarding bp management and promises she will take her antihypertensives regularly.  Discussed with patient and family extensively.      Hilario Quarry, MD 10/25/11 [phone]

## 2011-10-25 NOTE — ED Provider Notes (Signed)
History/physical exam/procedure(s) were performed by non-physician practitioner and as supervising physician I was immediately available for consultation/collaboration. I have reviewed all notes and am in agreement with care and plan.   Hilario Quarry, MD 10/25/11 [phone]

## 2011-10-28 NOTE — ED Provider Notes (Signed)
Medical screening examination/treatment/procedure(s) were performed by non-physician practitioner and as supervising physician I was immediately available for consultation/collaboration.  Jasmine Awe, MD 10/28/11 [phone]

## 2011-10-31 ENCOUNTER — Encounter: Payer: Self-pay | Admitting: Family Medicine

## 2011-10-31 NOTE — Telephone Encounter (Signed)
This encounter was created in error - please disregard.

## 2011-10-31 NOTE — Telephone Encounter (Signed)
Error

## 2012-03-06 ENCOUNTER — Ambulatory Visit (INDEPENDENT_AMBULATORY_CARE_PROVIDER_SITE_OTHER): Payer: PRIVATE HEALTH INSURANCE | Admitting: Family Medicine

## 2012-03-06 ENCOUNTER — Encounter (HOSPITAL_COMMUNITY): Payer: Self-pay

## 2012-03-06 ENCOUNTER — Inpatient Hospital Stay (HOSPITAL_COMMUNITY)
Admission: RE | Admit: 2012-03-06 | Discharge: 2012-03-07 | DRG: 305 | Disposition: A | Discharge status: Home / Self Care | Payer: No Typology Code available for payment source | Source: Ambulatory Visit | Attending: Family Medicine | Admitting: Family Medicine

## 2012-03-06 ENCOUNTER — Inpatient Hospital Stay (HOSPITAL_COMMUNITY): Payer: No Typology Code available for payment source

## 2012-03-06 VITALS — BP 196/124 | HR 76 | Temp 98.0°F | Ht 61.0 in | Wt 155.0 lb

## 2012-03-06 VITALS — BP 160/102 | HR 81 | Temp 98.5°F | Resp 20 | Ht 61.0 in | Wt 156.3 lb

## 2012-03-06 DIAGNOSIS — E663 Overweight: Secondary | ICD-10-CM

## 2012-03-06 DIAGNOSIS — I10 Essential (primary) hypertension: Secondary | ICD-10-CM

## 2012-03-06 DIAGNOSIS — J45909 Unspecified asthma, uncomplicated: Secondary | ICD-10-CM | POA: Diagnosis present

## 2012-03-06 DIAGNOSIS — Z8249 Family history of ischemic heart disease and other diseases of the circulatory system: Secondary | ICD-10-CM

## 2012-03-06 DIAGNOSIS — J40 Bronchitis, not specified as acute or chronic: Secondary | ICD-10-CM

## 2012-03-06 DIAGNOSIS — D509 Iron deficiency anemia, unspecified: Secondary | ICD-10-CM | POA: Diagnosis present

## 2012-03-06 DIAGNOSIS — Z6829 Body mass index (BMI) 29.0-29.9, adult: Secondary | ICD-10-CM

## 2012-03-06 DIAGNOSIS — R29898 Other symptoms and signs involving the musculoskeletal system: Secondary | ICD-10-CM | POA: Diagnosis present

## 2012-03-06 DIAGNOSIS — R519 Headache, unspecified: Secondary | ICD-10-CM | POA: Diagnosis present

## 2012-03-06 DIAGNOSIS — E876 Hypokalemia: Secondary | ICD-10-CM | POA: Diagnosis present

## 2012-03-06 DIAGNOSIS — Z23 Encounter for immunization: Secondary | ICD-10-CM

## 2012-03-06 DIAGNOSIS — R51 Headache: Secondary | ICD-10-CM | POA: Diagnosis present

## 2012-03-06 DIAGNOSIS — I160 Hypertensive urgency: Secondary | ICD-10-CM

## 2012-03-06 HISTORY — DX: Headache, unspecified: R51.9

## 2012-03-06 HISTORY — DX: Headache: R51

## 2012-03-06 LAB — URINALYSIS, ROUTINE W REFLEX MICROSCOPIC
Bilirubin Urine: NEGATIVE
Glucose, UA: NEGATIVE mg/dL
Protein, ur: NEGATIVE mg/dL
Specific Gravity, Urine: 1.012 (ref 1.005–1.030)
Urobilinogen, UA: 0.2 mg/dL (ref 0.0–1.0)

## 2012-03-06 LAB — CBC
HCT: 36.1 % (ref 36.0–46.0)
MCH: 21.1 pg — ABNORMAL LOW (ref 26.0–34.0)
MCV: 67.9 fL — ABNORMAL LOW (ref 78.0–100.0)
Platelets: 314 10*3/uL (ref 150–400)
RDW: 17.4 % — ABNORMAL HIGH (ref 11.5–15.5)

## 2012-03-06 LAB — URINE MICROSCOPIC-ADD ON

## 2012-03-06 LAB — COMPREHENSIVE METABOLIC PANEL
ALT: 14 U/L (ref 0–35)
AST: 16 U/L (ref 0–37)
Albumin: 4.3 g/dL (ref 3.5–5.2)
CO2: 29 mEq/L (ref 19–32)
Chloride: 99 mEq/L (ref 96–112)
GFR calc non Af Amer: 68 mL/min — ABNORMAL LOW (ref >90)
Potassium: 3.3 mEq/L — ABNORMAL LOW (ref 3.5–5.1)
Sodium: 138 mEq/L (ref 135–145)
Total Bilirubin: 0.2 mg/dL — ABNORMAL LOW (ref 0.3–1.2)

## 2012-03-06 LAB — CARDIAC PANEL(CRET KIN+CKTOT+MB+TROPI)
CK, MB: 2.8 ng/mL (ref 0.3–4.0)
Relative Index: 1.6 (ref 0.0–2.5)
Troponin I: <0.3 ng/mL (ref <0.30)
Troponin I: <0.3 ng/mL (ref <0.30)

## 2012-03-06 LAB — HEMOGLOBIN A1C: Mean Plasma Glucose: 126 mg/dL — ABNORMAL HIGH (ref <117)

## 2012-03-06 MED ORDER — LISINOPRIL-HYDROCHLOROTHIAZIDE 20-25 MG PO TABS: 1.0000 | ORAL_TABLET | Freq: Every day | ORAL | Status: DC

## 2012-03-06 MED ORDER — SODIUM CHLORIDE 0.9 % IJ SOLN: 3.0000 mL | INTRAMUSCULAR | Status: DC | PRN

## 2012-03-06 MED ORDER — SODIUM CHLORIDE 0.9 % IV SOLN: 250.0000 mL | INTRAVENOUS | Status: DC | PRN

## 2012-03-06 MED ORDER — SODIUM CHLORIDE 0.9 % IJ SOLN
3.0000 mL | Freq: Two times a day (BID) | INTRAMUSCULAR | Status: DC
  Administered 2012-03-06 – 2012-03-07 (×3): 3 mL via INTRAVENOUS

## 2012-03-06 MED ORDER — ONDANSETRON HCL 4 MG/2ML IJ SOLN: 4.0000 mg | Freq: Four times a day (QID) | INTRAMUSCULAR | Status: DC | PRN

## 2012-03-06 MED ORDER — ALBUTEROL SULFATE HFA 108 (90 BASE) MCG/ACT IN AERS
2.0000 | INHALATION_SPRAY | RESPIRATORY_TRACT | Status: DC | PRN
  Filled 2012-03-06: qty 6.7

## 2012-03-06 MED ORDER — METOPROLOL TARTRATE 1 MG/ML IV SOLN
5.0000 mg | Freq: Four times a day (QID) | INTRAVENOUS | Status: DC | PRN
  Filled 2012-03-06: qty 5

## 2012-03-06 MED ORDER — ACETAMINOPHEN 325 MG PO TABS
650.0000 mg | ORAL_TABLET | Freq: Four times a day (QID) | ORAL | Status: DC | PRN
  Administered 2012-03-06: 650 mg via ORAL
  Filled 2012-03-06: qty 2

## 2012-03-06 MED ORDER — SODIUM CHLORIDE 0.9 % IJ SOLN
3.0000 mL | Freq: Two times a day (BID) | INTRAMUSCULAR | Status: DC
  Administered 2012-03-06: 3 mL via INTRAVENOUS

## 2012-03-06 MED ORDER — LISINOPRIL 20 MG PO TABS
20.0000 mg | ORAL_TABLET | Freq: Every day | ORAL | Status: DC
  Filled 2012-03-06: qty 1

## 2012-03-06 MED ORDER — ALBUTEROL 90 MCG/ACT IN AERS: 2.0000 | INHALATION_SPRAY | RESPIRATORY_TRACT | Status: DC | PRN

## 2012-03-06 MED ORDER — POTASSIUM CHLORIDE CRYS ER 20 MEQ PO TBCR
40.0000 meq | EXTENDED_RELEASE_TABLET | Freq: Once | ORAL | Status: AC
  Administered 2012-03-06: 40 meq via ORAL
  Filled 2012-03-06: qty 2

## 2012-03-06 MED ORDER — FLUTICASONE PROPIONATE 50 MCG/ACT NA SUSP
2.0000 | Freq: Every day | NASAL | Status: DC
  Administered 2012-03-06 – 2012-03-07 (×2): 2 via NASAL
  Filled 2012-03-06: qty 16

## 2012-03-06 MED ORDER — ACETAMINOPHEN 650 MG RE SUPP: 650.0000 mg | Freq: Four times a day (QID) | RECTAL | Status: DC | PRN

## 2012-03-06 MED ORDER — HYDROCHLOROTHIAZIDE 25 MG PO TABS
25.0000 mg | ORAL_TABLET | Freq: Every day | ORAL | Status: DC
  Administered 2012-03-07: 25 mg via ORAL
  Filled 2012-03-06: qty 1

## 2012-03-06 MED ORDER — POTASSIUM CHLORIDE CRYS ER 20 MEQ PO TBCR
EXTENDED_RELEASE_TABLET | ORAL | Status: AC
  Filled 2012-03-06: qty 2

## 2012-03-06 MED ORDER — ONDANSETRON HCL 4 MG PO TABS: 4.0000 mg | ORAL_TABLET | Freq: Four times a day (QID) | ORAL | Status: DC | PRN

## 2012-03-06 MED ORDER — PNEUMOCOCCAL VAC POLYVALENT 25 MCG/0.5ML IJ INJ
0.5000 mL | INJECTION | INTRAMUSCULAR | Status: AC
  Administered 2012-03-07: 0.5 mL via INTRAMUSCULAR
  Filled 2012-03-06: qty 0.5

## 2012-03-06 MED ORDER — HYDROCODONE-ACETAMINOPHEN 5-325 MG PO TABS: 1.0000 | ORAL_TABLET | ORAL | Status: DC | PRN

## 2012-03-06 MED ORDER — LORATADINE 10 MG PO TABS
10.0000 mg | ORAL_TABLET | Freq: Every day | ORAL | Status: DC
  Administered 2012-03-06 – 2012-03-07 (×2): 10 mg via ORAL
  Filled 2012-03-06 (×2): qty 1

## 2012-03-06 MED ORDER — METOPROLOL TARTRATE 50 MG PO TABS: 50.0000 mg | ORAL_TABLET | Freq: Two times a day (BID) | ORAL | Status: DC

## 2012-03-06 NOTE — Progress Notes (Signed)
Came to see patient as she was admitted to the hospital. Complains of a 5/10 frontal headache along her sinuses as well as sinus congestion.  She continues to have left hand weakness associated with tingling in the hand that started when she was on her way to the hospital. The tingling is mostly in her small and index fingers. She denies any numbness.  On exam: she has decreased hand grip strength in her left hand, intact deltoid abduction bilaterally, intact strength in lower extremities bilaterally. 2+ patellar reflex bilaterally. 2+ right brachiolradialis reflex, 1+ left brachioradialis reflex. Intact sensation to light touch in upper extremities bilaterally. CN2-12 grossly intact.  Plan: will follow up on head Ct scan and cardiac enzymes.

## 2012-03-06 NOTE — Assessment & Plan Note (Signed)
With left upper extremity weakness and nonspecific EKG changes, will admit for observation, blood pressure control, and further workup. Please see H&P for 03/06/12.
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
## 2012-03-06 NOTE — Progress Notes (Signed)
  Subjective:    Patient ID: Jillian Vasquez, female    DOB: 1976-06-29, 36 y.o.   MRN: [id number]  HPI CC: allergies and elevated BP  1. Elevated BP. Patient notes that she began having severe headache 2 nights ago, this was associated with some funny feeling in left arm and subjective weakness. She has previously had severely elevated blood pressures associated with headaches. States she checked her blood pressure last night and was 200/113. She took her medication lisinopril/HCTZ and blood pressure did improve to 150 systolic. She continued to have headache. Again check her blood pressure and was 130/89 at 11 PM. She went to bed and woke up this morning again with severe headache. She also endorses some spots in her vision, weakness with using steering well with left arm. She took her medication again this morning, however blood pressure remains elevated at 196/124 in the office. Endorses compliance with medication only 3/7 days per week. Previously took amlodipine however, caused intolerable side effects. Notably she has been advised for admission for hypertensive urgency previously, however refused this in the past. She recognizes current symptoms are worrisome and is agreeable to admission today.  2. Allergies. Worsening of runny nose, congestion, watery eyes for past week. Has not taken any medications for these symptoms.   Review of Systems She denies chest pain, palpitations, syncope, dyspnea, cough, low ext edema, vision loss, emesis, rash, abdominal pain, fever.     Objective:   Physical Exam  Vitals reviewed. Constitutional: She is oriented to person, place, and time. She appears well-developed and well-nourished. No distress.  HENT:  Head: Normocephalic and atraumatic.       Nasal congestion and watery eyes. Oropharynx with mild erythema.  Eyes: EOM are normal. Pupils are equal, round, and reactive to light.       Visual fields grossly intact. Fundic exam unremarkable.  Neck:  Normal range of motion. Neck supple. No JVD present. No thyromegaly present.  Cardiovascular: Normal rate, regular rhythm, normal heart sounds and intact distal pulses.  Exam reveals no gallop.   No murmur heard. Pulmonary/Chest: Effort normal and breath sounds normal. No respiratory distress. She has no wheezes.  Abdominal: Soft. Bowel sounds are normal.  Musculoskeletal: She exhibits no edema and no tenderness.  Neurological: She is alert and oriented to person, place, and time. No cranial nerve deficit. Coordination normal.       5/5 right upper extremity strength. 4/5 left grip and wrist extension and flexion. 5/5 left deltoid and triceps/biceps strength. Identify bilateral lower extremity strength. Normal coordination on finger to nose. Normal gait.  Skin: No rash noted.  Psychiatric: She has a normal mood and affect.       Mildly anxious and tearful.   EKG: Normal sinus rhythm, mild prolonged QTC 480. Normal R-wave progression. Some lateral T wave flattening compared to previous.     Assessment & Plan:

## 2012-03-06 NOTE — H&P (Signed)
I have seen and examined [name].  Please see Dr. Ernest Haber note for details.  I agree with her plan and note.  [name] is a 36 yo female with poorly controlled hypertension who notes 2 days of L arm weakness (constant), HA, and rare, fleeting squeezing chest pain.  Exam notable for elevated blood pressure, L arm weakness (grip 4/5, flexion 4/5).  We will admit with plan to obtain head CT, observe on telemetry and cycle enzymes.

## 2012-03-06 NOTE — Progress Notes (Signed)
PCP note: Went by pt room for social visit tonight.  Pt states she is feeling much better and is more motivated than ever to be compliant with her medications.  Pt BP has slowly improved throughout the day, but is still elevated 164/112 currently.  Will continue to titrate up on medications as needed.  I appreciate the inpatient team's care of this patient.  I will look forward to seeing this patient in my office after she is discharged to continue to titrate up on bp medications as needed.   Rondle Lohse S. Edmonia James, MD Family Medicine Residency Program PGY-3

## 2012-03-06 NOTE — H&P (Signed)
I have seen and examined [name] with Dr. Cristal Ford and agree with her plan and note.

## 2012-03-06 NOTE — Progress Notes (Signed)
03/06/2012  Patient was a direct admit from Md office to 6700, she is alert, oriented and ambulatory. Patient arrive on unit she was place on telemetry, which show she was sinus rhythm. Patient c/o of headache   blood  Pressure was 146/103. Patient was complaining of tingling in the left arm and md is aware. Biochemist, clinical.

## 2012-03-07 DIAGNOSIS — E876 Hypokalemia: Secondary | ICD-10-CM | POA: Diagnosis present

## 2012-03-07 LAB — BASIC METABOLIC PANEL
BUN: 16 mg/dL (ref 6–23)
Chloride: 103 mEq/L (ref 96–112)
GFR calc Af Amer: 73 mL/min — ABNORMAL LOW (ref >90)
GFR calc non Af Amer: 63 mL/min — ABNORMAL LOW (ref >90)
Potassium: 3.9 mEq/L (ref 3.5–5.1)
Sodium: 136 mEq/L (ref 135–145)

## 2012-03-07 LAB — CBC
HCT: 33.7 % — ABNORMAL LOW (ref 36.0–46.0)
Hemoglobin: 10.3 g/dL — ABNORMAL LOW (ref 12.0–15.0)
MCHC: 30.6 g/dL (ref 30.0–36.0)
RBC: 4.92 MIL/uL (ref 3.87–5.11)
WBC: 6.5 10*3/uL (ref 4.0–10.5)

## 2012-03-07 LAB — CARDIAC PANEL(CRET KIN+CKTOT+MB+TROPI)
CK, MB: 2.3 ng/mL (ref 0.3–4.0)
Troponin I: <0.3 ng/mL (ref <0.30)

## 2012-03-07 LAB — LIPID PANEL
Cholesterol: 151 mg/dL (ref 0–200)
Total CHOL/HDL Ratio: 3.4 RATIO

## 2012-03-07 MED ORDER — LISINOPRIL 40 MG PO TABS
40.0000 mg | ORAL_TABLET | Freq: Every day | ORAL | Status: DC
  Administered 2012-03-07: 40 mg via ORAL
  Filled 2012-03-07: qty 1

## 2012-03-07 MED ORDER — AMLODIPINE BESYLATE 5 MG PO TABS: 5.0000 mg | ORAL_TABLET | Freq: Every day | ORAL | Status: DC

## 2012-03-07 MED ORDER — FERROUS SULFATE 325 (65 FE) MG PO TABS: 325.0000 mg | ORAL_TABLET | Freq: Every day | ORAL | Status: DC

## 2012-03-07 MED ORDER — FLUTICASONE PROPIONATE 50 MCG/ACT NA SUSP: 2.0000 | Freq: Every day | NASAL | Status: DC

## 2012-03-07 MED ORDER — AMLODIPINE BESYLATE 5 MG PO TABS
5.0000 mg | ORAL_TABLET | Freq: Every day | ORAL | Status: DC
  Administered 2012-03-07: 5 mg via ORAL
  Filled 2012-03-07: qty 1

## 2012-03-07 MED ORDER — FERROUS SULFATE 325 (65 FE) MG PO TABS
325.0000 mg | ORAL_TABLET | Freq: Every day | ORAL | Status: DC
  Filled 2012-03-07: qty 1

## 2012-03-07 MED ORDER — AMLODIPINE BESYLATE 10 MG PO TABS: 10.0000 mg | ORAL_TABLET | Freq: Every day | ORAL | Status: DC

## 2012-03-07 MED ORDER — LISINOPRIL 40 MG PO TABS: 40.0000 mg | ORAL_TABLET | Freq: Every day | ORAL | Status: DC

## 2012-03-07 MED ORDER — HYDROCHLOROTHIAZIDE 25 MG PO TABS: 25.0000 mg | ORAL_TABLET | Freq: Every day | ORAL | Status: DC

## 2012-03-07 MED ORDER — LORATADINE 10 MG PO TABS: 10.0000 mg | ORAL_TABLET | Freq: Every day | ORAL | Status: DC

## 2012-03-07 NOTE — Discharge Instructions (Signed)
You were in the hospital for dangerously high blood pressures. Your head CT was normal. Your cardiac enzymes were normal as well. Your kidney function did become worst overnight and we want to make sure that it does not get any worst. We will therefore be getting repeat lab work at the clinic when you follow up with Dr. Edmonia James next week.   For the headache, it is most likely due to sinuses. Continue with the flonase, claritin and nasal saline.   If you start having chest pain, shortness of breath, nausea vomiting or fevers, return to clinic or to the emergency department.

## 2012-03-07 NOTE — Clinical Social Work Psychosocial (Signed)
     Clinical Social Work Department BRIEF PSYCHOSOCIAL ASSESSMENT 03/07/2012  Patient:  Jillian Vasquez, Jillian Vasquez     Account Number:  [id number]     Admit date:  03/06/2012  Clinical Social Worker:  Lourdes Sledge  Date/Time:  03/07/2012 03:37 PM  Referred by:  Physician  Date Referred:  03/07/2012 Referred for  Advanced Directives   Other Referral:   Interview type:  Patient Other interview type:    PSYCHOSOCIAL DATA Living Status:  WITH MINOR CHILDREN Admitted from facility:   Level of care:   Primary support name:  Arva Chafe Primary support relationship to patient:  PARENT Degree of support available:   Pt did not disclose as she stated she was tired and wanted CSW to just leave advanced directives packet.    CURRENT CONCERNS Current Concerns  Other - See comment   Other Concerns:   CSW received a referral for advanced directives.    SOCIAL WORK ASSESSMENT / PLAN CSW received a referral for advanced directives. CSW visited pt room and was informed by pt that she was not interested in completing advanced directives however CSW could leave the packet and pt would review.   Assessment/plan status:  No Further Intervention Required Other assessment/ plan:   Information/referral to community resources:   CSW provided pt with an advanced directives packet and CSW provided her contact information.    PATIENTS/FAMILYS RESPONSE TO PLAN OF CARE: Pt laying in bed appeared sleepy and was oriented. Pt stated she was not interested in doing advanced directives however would look over the packet. Pt declined CSW needs. CSW signing off.

## 2012-03-07 NOTE — Progress Notes (Signed)
Subjective: Headache improving but still present. Flonase is helping.  Left arm weakness and tingling has resolved.  No chest pain, no shortness of breath. No abdominal pain   Objective: Vital signs in last 24 hours: Temp:  [97.9 F (36.6 C)-98.3 F (36.8 C)] 98 F (36.7 C) (05/15 0900) Pulse Rate:  [76-87] 87  (05/15 0900) Resp:  [20] 20  (05/15 0900) BP: (155-189)/(108-125) 170/112 mmHg (05/15 1222) SpO2:  [99 %-100 %] 100 % (05/15 0900) Weight:  [156 lb 4.9 oz (70.9 kg)] 156 lb 4.9 oz (70.9 kg) (05/14 2007) Weight change:  Last BM Date: 03/05/12  Intake/Output from previous day: 05/14 0701 - 05/15 0700 In: 360 [P.O.:360] Out: 2 [Urine:2] Intake/Output this shift: Total I/O In: 240 [P.O.:240] Out: -   General appearance: alert, cooperative, appears stated age and no distress Resp: clear to auscultation bilaterally Cardio: regular rate and rhythm, S1, S2 normal, no murmur, click, rub or gallop GI: soft, non-tender; bowel sounds normal; no masses,  no organomegaly Right arm: intact sensation to light touch, decreased strength compared to right arm, but improved since yesterday, +1 brachioradialis reflex in left arm   Lab Results:  Basename 03/07/12 0500 03/06/12 1342  WBC 6.5 6.8  HGB 10.3* 11.2*  HCT 33.7* 36.1  PLT 268 314   BMET  Basename 03/07/12 0500 03/06/12 1342  NA 136 138  K 3.9 3.3*  CL 103 99  CO2 26 29  GLUCOSE 106* 102*  BUN 16 15  CREATININE 1.12* 1.05  CALCIUM 8.8 9.6    Studies/Results: Ct Head Wo Contrast  03/06/2012  *RADIOLOGY REPORT*  Clinical Data: Left arm weakness.  Hypertension.  CT HEAD WITHOUT CONTRAST  Technique:  Contiguous axial images were obtained from the base of the skull through the vertex without contrast.  Comparison: CT of the head 09/09/2008.  Findings: No acute cortical infarct, hemorrhage or mass lesion is present.  The ventricles are of normal size.  No significant extra- axial fluid collection is present.  There is  persistent opacification of the ethmoid air cells bilaterally.  Circumferential mucosal thickening in the sphenoid sinuses is slightly less prominent than on the prior study. Diffuse circumferential thickening of the maxillary sinuses is slightly less prominent than previously seen as well.  Maxillary disease is chronic with wall thickening.  The mastoid air cells are clear.  IMPRESSION:  1.  Normal CT appearance of the brain. 2.  Diffuse sinus disease is slightly less prominent than previously seen.  Original Report Authenticated By: Jamesetta Orleans. MATTERN, M.D.    Medications:  Scheduled:   . amLODipine  5 mg Oral Daily  . ferrous sulfate  325 mg Oral Q breakfast  . fluticasone  2 spray Each Nare Daily  . hydrochlorothiazide  25 mg Oral Daily  . lisinopril  40 mg Oral Daily  . loratadine  10 mg Oral Daily  . pneumococcal 23 valent vaccine  0.5 mL Intramuscular Tomorrow-1000  . potassium chloride  40 mEq Oral Once  . sodium chloride  3 mL Intravenous Q12H  . sodium chloride  3 mL Intravenous Q12H  . DISCONTD: lisinopril  20 mg Oral Daily   Continuous:   Assessment/Plan: 36 yo female with h/o hypertension who presented with hypertensive emergency.  # Hypertensive emergency.  Sill elevated blood pressures on HCTZ 25 and lisinopril 20. Troponins have been negative x3 ruling out acute MI. EKG on admission showed longer QT but no evidence of ischemia. Head CT was negative for acute bleed. Renal  function has dropped since yesterday with Creatinine of 1.12 compared to 1.05 suggesting renal damage. Left arm weakness is improving and could have been due to transient ischemia. This could also be due to cervical impingement, in either case it is improving.  - will increase lisinopril and add amlodipine 5mg  - repeat EKG # Headache: Most likely sinus disease - continue flonase and zyrtec # microcytic anemia: unclear etiology. Chronic. Not actively bleeding. Follow up as outpatient.  # FEN/GI: Low  sodium diet. Saline lock IV. F/u BMET.  #  Prophylaxis: SCDs.  # Disposition: Telemetry unit for monitoring. If BP better controled today, can go home.    LOS: 1 day   Smitty Ackerley 03/07/2012, 2:29 PM

## 2012-03-07 NOTE — Progress Notes (Signed)
03/07/2012  Pneumococcal  vaccine given in left deltoid at 1352. Lot #: [id number] and expire May 29, 2013. Gloriajean Dell RN

## 2012-03-07 NOTE — Progress Notes (Signed)
FMTS Attending Note  Patient seen and examined by me today, she reports that her HA is much improved, as is her L upper extremity weakness.  No visual changes noted.  She is right-handed.   Of note, her BP remains elevated at 170/112 at noontime.  She has impaired renal function with a GFR of 60.   I agree with the increased dose of her lisinopril.  Would discharge today to home with close follow up in the coming 1 week, for repeat metabolic panel and BP check.  Would consider CCB as my next agent; she may end up on several BP meds, but given that her adherence to monotherapy has been poor, I believe it is too early to consider exhaustive workup for secondary HTN.  Of note, on her exam today she does not have abdominal bruits.   She does have bilateral maxillary sinus tenderness and describes 2 weeks of mucopurulent nasal discharge, without fevers or blood.  Continue Flonase and nasal steroid spray, mucolytics as outpatient for possible early sinusitis.  Paula Compton, MD

## 2012-03-08 NOTE — Discharge Summary (Signed)
Physician Discharge Summary   Patient ID: Jillian Vasquez [id number] 36 y.o. July 31, 1976  Admit date: 03/06/2012  Discharge date and time: 03/07/2012  7:16 PM   Admitting Physician: Barbaraann Barthel, MD   Discharge Physician: Barbaraann Barthel, MD   Admission Diagnoses: Hypertensive urgerency  Discharge Diagnoses:  1. Hypertensive emergency 2. Sinus congestion 3. Microcytic anemia  Admission Condition: fair  Discharged Condition: good  Indication for Admission: elevated blood pressure with headache and left arm weakness  Hospital Course:  36 yo female with h/o hypertension who presented with hypertensive emergency.  # Hypertensive emergency:   Blood pressure on admission was 196/124. Cardiac enzymes were obtained and were normal, excluding acute cardiac injury. EKG did not show any evidence of ischemia. It did show prolonged QT but improved from previous EKG. Head CT was negative for acute bleed. She was started back on home HCTZ 25mg  and lisinopril 20mg . She continued having elevated blood pressures at which point lisinopril was increased to 40mg .   Creatinine on day of discharge was elevated from prior at 1.12, showing concern for kidney injury from elevated blood pressure.   On discharge, patient's blood pressure ranged from 141-160/88-110. She received one dose of amlodipine on discharge and given BP of 141/88 was discharged on lisinopril 40 and HCTZ 25 with option to add amlodipine as an outpatient.  TSH and lipid panel were normal. A1C was 6.0. No further workup was done to investigate causes of secondary hypertension, given that patient had history of non compliance. She was therefore considered primary hypertension at the time of hospitalization.   # left arm numbness: patient described arm numbness and tingling. Head CT did not show any evidence of acute bleed. Patient felt that weakness was improved on day of discharge. She did continue having some mild decreased strength in the  left hand compared to the right, but improved from prior. Differential included TIA from hypertension vs cervical spine radiculopathy given the presence of tingling. Since symtpoms were improving, MRI to evaluate for ischemia was not deemed necessary and further cervical spine workup was deferred to the outpatient setting in case this persisted.    # Headache: described frontal headache associated with sinus congestion and drainage. Sinus congestion was thought to be a contributing factor to her headaches. Flonase and claritin were started with relief of her headache. Head CT also confirmed the presence of maxillary and sphenoid sinus disease.  # Increase in creatinine:  Creatinine on day of admission was 1.05 which increased to 1.12 on discharge. There was concern that elevated blood pressures might have been a reason for the increase in creatinine. This will need to be followed up as an outpatient.  # microcytic anemia: unclear etiology. Chronic. Not actively bleeding. Follow up as outpatient.   Consults: None  Significant Diagnostic Studies:  CT HEAD WITHOUT CONTRAST  Technique: Contiguous axial images were obtained from the base of  the skull through the vertex without contrast.  Comparison: CT of the head 09/09/2008.  Findings: No acute cortical infarct, hemorrhage or mass lesion is  present. The ventricles are of normal size. No significant extra-  axial fluid collection is present.  There is persistent opacification of the ethmoid air cells  bilaterally. Circumferential mucosal thickening in the sphenoid  sinuses is slightly less prominent than on the prior study.  Diffuse circumferential thickening of the maxillary sinuses is  slightly less prominent than previously seen as well. Maxillary  disease is chronic with wall thickening. The mastoid air  cells are  clear.  IMPRESSION:  1. Normal CT appearance of the brain.  2. Diffuse sinus disease is slightly less prominent than    previously seen.  03/06/12  Ref. Range 03/06/2012 17:00  Color, Urine Latest Range: YELLOW  YELLOW  APPearance Latest Range: CLEAR  CLOUDY (A)  Specific Gravity, Urine Latest Range: 1.005-1.030  1.012  pH Latest Range: 5.0-8.0  7.0  Glucose, UA Latest Range: NEGATIVE mg/dL NEGATIVE  Bilirubin Urine Latest Range: NEGATIVE  NEGATIVE  Ketones, ur Latest Range: NEGATIVE mg/dL NEGATIVE  Protein Latest Range: NEGATIVE mg/dL NEGATIVE  Urobilinogen, UA Latest Range: 0.0-1.0 mg/dL 0.2  Nitrite Latest Range: NEGATIVE  NEGATIVE  Leukocytes, UA Latest Range: NEGATIVE  NEGATIVE  Hgb urine dipstick Latest Range: NEGATIVE  LARGE (A)  WBC, UA Latest Range: <3 WBC/hpf 0-2  RBC / HPF Latest Range: <3 RBC/hpf 0-2  Squamous Epithelial / LPF Latest Range: RARE  MANY (A)  Bacteria, UA Latest Range: RARE  MANY (A)  Casts Latest Range: NEGATIVE  HYALINE CASTS (A)   CBC    Component Value Date/Time   WBC 6.5 03/07/2012 0500   RBC 4.92 03/07/2012 0500   HGB 10.3* 03/07/2012 0500   HCT 33.7* 03/07/2012 0500   PLT 268 03/07/2012 0500   MCV 68.5* 03/07/2012 0500   MCH 20.9* 03/07/2012 0500   MCHC 30.6 03/07/2012 0500   RDW 17.4* 03/07/2012 0500   LYMPHSABS 3.0 09/09/2008 1005   MONOABS 0.5 09/09/2008 1005   EOSABS 0.3 09/09/2008 1005   BASOSABS 0.0 09/09/2008 1005    BMET CMP     Component Value Date/Time   NA 136 03/07/2012 0500   K 3.9 03/07/2012 0500   CL 103 03/07/2012 0500   CO2 26 03/07/2012 0500   GLUCOSE 106* 03/07/2012 0500   BUN 16 03/07/2012 0500   CREATININE 1.12* 03/07/2012 0500   CALCIUM 8.8 03/07/2012 0500   PROT 7.9 03/06/2012 1342   ALBUMIN 4.3 03/06/2012 1342   AST 16 03/06/2012 1342   ALT 14 03/06/2012 1342   ALKPHOS 57 03/06/2012 1342   BILITOT 0.2* 03/06/2012 1342   GFRNONAA 63* 03/07/2012 0500   GFRAA 73* 03/07/2012 0500   A1C: 6.0 TSH: 1.258 Lipid panel: trig: 151, LDL: 97, HDL: 47 Treatments:  Lisinopril 40 Hydrochlorothiazide 25 Did not get any IV anti-hypertensive  therapy  Discharge Exam: Filed Vitals:   03/07/12 1420 03/07/12 1532 03/07/12 1748 03/07/12 1751  BP: 181/120 141/88 154/110 160/102  Pulse: 81     Temp: 98.5 F (36.9 C)     TempSrc: Oral     Resp: 20     Height:      Weight:      SpO2: 99%      General: alert and oriented, in no acute distress, comfortable appearing HEENT: moist mucous membranes, EOMI, PERRLA CV: S1S2, rrr, no murmurs appreciated Pulm: CTA b/l Abdomen: soft, non tender, non distended Extr: no edema appreciated Neuro: left arm: 4+/5 left hand grip, 5/5 strength right hand grip, 1+ brachioradialis reflex bilaterally, CN2-12 grossly intact   Disposition: 01-Home or Self Care  Patient Instructions:  Medication List  As of 03/08/2012  5:54 PM   STOP taking these medications         lisinopril-hydrochlorothiazide 20-25 MG per tablet         TAKE these medications         albuterol 108 (90 BASE) MCG/ACT inhaler   Commonly known as: PROVENTIL HFA;VENTOLIN HFA   Inhale 2 puffs  into the lungs every 6 (six) hours as needed. For wheezing      ferrous sulfate 325 (65 FE) MG tablet   Take 1 tablet (325 mg total) by mouth daily with breakfast.      fluticasone 50 MCG/ACT nasal spray   Commonly known as: FLONASE   Place 2 sprays into the nose daily.      hydrochlorothiazide 25 MG tablet   Commonly known as: HYDRODIURIL   Take 1 tablet (25 mg total) by mouth daily.      lisinopril 40 MG tablet   Commonly known as: PRINIVIL,ZESTRIL   Take 1 tablet (40 mg total) by mouth daily.      loratadine 10 MG tablet   Commonly known as: CLARITIN   Take 1 tablet (10 mg total) by mouth daily.           Activity: activity as tolerated Diet: heart healthy diet Wound Care: none needed  Follow-up Information    Follow up with Ellin Mayhew, MD. Jake Shark 03/13/12 at 9am)    Contact information:   [address] [place] [address] [phone]        Follow up items: - BP: discharged on  full dose lisinopril 40 and HCTZ 25. May benefit from third agent if she is compliant and her BP doesn't improve.  - creatinine: consider repeating on follow up - hematuria: patient reported being on her period which explained hematuria. Consider repeating once of menstrual cycle to rule out true hematuria  Signed: Marena Chancy 03/08/2012 5:54 PM

## 2012-03-13 ENCOUNTER — Encounter: Payer: Self-pay | Admitting: Family Medicine

## 2012-03-13 ENCOUNTER — Ambulatory Visit (INDEPENDENT_AMBULATORY_CARE_PROVIDER_SITE_OTHER): Payer: PRIVATE HEALTH INSURANCE | Admitting: Family Medicine

## 2012-03-13 VITALS — BP 150/107 | HR 76 | Ht 61.0 in | Wt 153.0 lb

## 2012-03-13 DIAGNOSIS — E876 Hypokalemia: Secondary | ICD-10-CM

## 2012-03-13 DIAGNOSIS — R209 Unspecified disturbances of skin sensation: Secondary | ICD-10-CM

## 2012-03-13 DIAGNOSIS — R200 Anesthesia of skin: Secondary | ICD-10-CM

## 2012-03-13 DIAGNOSIS — D649 Anemia, unspecified: Secondary | ICD-10-CM

## 2012-03-13 DIAGNOSIS — I10 Essential (primary) hypertension: Secondary | ICD-10-CM

## 2012-03-13 MED ORDER — AMLODIPINE BESYLATE 5 MG PO TABS: 5.0000 mg | ORAL_TABLET | Freq: Every day | ORAL | Status: DC

## 2012-03-13 NOTE — Patient Instructions (Signed)
Blood pressure: Continue HCTZ, lisinopril- will add norvasc 5mg .  Continue to try to develop exercise plan and reduce sodium in diet  Left arm numbness: Pay attention to symptoms, keep journal of symptoms what makes it better/worse/how often is it occuring  Renal concerns: Will recheck renal function in 2 weeks   Anemia: Continue iron supplement- will recheck hgb in 2 weeks.   Return in 2 weeks for recheck.   Sodium-Controlled Diet Sodium is a mineral. It is found in many foods. Sodium may be found naturally or added during the making of a food. The most common form of sodium is salt, which is made up of sodium and chloride. Reducing your sodium intake involves changing your eating habits. The following guidelines will help you reduce the sodium in your diet:  Stop using the salt shaker.   Use salt sparingly in cooking and baking.   Substitute with sodium-free seasonings and spices.   Do not use a salt substitute (potassium chloride) without your caregiver's permission.   Include a variety of fresh, unprocessed foods in your diet.   Limit the use of processed and convenience foods that are high in sodium.  USE THE FOLLOWING FOODS SPARINGLY: Breads/Starches  Commercial bread stuffing, commercial pancake or waffle mixes, coating mixes. Waffles. Croutons. Prepared (boxed or frozen) potato, rice, or noodle mixes that contain salt or sodium. Salted [place] fries or hash browns. Salted popcorn, breads, crackers, chips, or snack foods.  Vegetables  Vegetables canned with salt or prepared in cream, butter, or cheese sauces. Sauerkraut. Tomato or vegetable juices canned with salt.   Fresh vegetables are allowed if rinsed thoroughly.  Fruit  Fruit is okay to eat.  Meat and Meat Substitutes  Salted or smoked meats, such as bacon or Canadian bacon, chipped or corned beef, hot dogs, salt pork, luncheon meats, pastrami, ham, or sausage. Canned or smoked fish, poultry, or meat. Processed  cheese or cheese spreads, blue or Roquefort cheese. Battered or frozen fish products. Prepared spaghetti sauce. Baked beans. Reuben sandwiches. Salted nuts. Caviar.  Milk  Limit buttermilk to 1 cup per week.  Soups and Combination Foods  Bouillon cubes, canned or dried soups, broth, consomm. Convenience (frozen or packaged) dinners with more than 600 mg sodium. Pot pies, pizza, Asian food, fast food cheeseburgers, and specialty sandwiches.  Desserts and Sweets  Regular (salted) desserts, pie, commercial fruit snack pies, commercial snack cakes, canned puddings.   Eat desserts and sweets in moderation.  Fats and Oils  Gravy mixes or canned gravy. No more than 1 to 2 tbs of salad dressing. Chip dips.   Eat fats and oils in moderation.  Beverages  See those listed under the vegetables and milk groups.  Condiments  Ketchup, mustard, meat sauces, salsa, regular (salted) and lite soy sauce or mustard. Dill pickles, olives, meat tenderizer. Prepared horseradish or pickle relish. Dutch-processed cocoa. Baking powder or baking soda used medicinally. Worcestershire sauce. "Light" salt. Salt substitute, unless approved by your caregiver.  Document Released: 04/01/2002 Document Revised: 09/29/2011 Document Reviewed: 11/02/2009 [facility] Patient Information 2012 [place], [place].

## 2012-03-13 NOTE — Progress Notes (Signed)
  Subjective:    Patient ID: Jillian Vasquez, female    DOB: [birth year], 36 y.o.   MRN: [id number]  HPI Blood pressure followup: Patient states the blood pressure at home has been ranging from 130s to 150s systolic. And from 90s to 100s diastolic. Taking HCTZ and lisinopril daily as directed. Blood pressure 152/100 on manual recheck at today's appointment. Patient states she has no further headaches. Feeling much better. No side effects her medications. No dizziness. No vision changes. No syncope. Headaches improved with Claritin and Flonase.  Left arm numbness: Occasional have numbness and tingling in left arm. Had this in the hospital that had improved slowly as blood pressure was under control. Patient states that she continues to have occasional episodes of this. Unsure frequency. Seems to occur when blood pressure is elevated. Seems to improve or go away when blood pressure is improved. Seems improved compared to during hospitalization. Some minimal weakness in left arm compared to right. No redness to joint. No swelling of joint.  Concern about renal function: Patient states that creatinine was elevated at discharge. On admission was 1.05 on discharge was 1.12. Wants to followup on this to make sure that renal function is improving. Normal urination.  Anemia: Patient diagnosed with microcytic anemia during hospitalization. Hemoglobin 10.3 at time of hospitalization. Patient is taking iron supplement. No changes in energy level.  Smoking status reviewed.   Review of Systems    as per above. Objective:   Physical Exam  Constitutional: She appears well-developed and well-nourished.  HENT:  Head: Normocephalic and atraumatic.  Eyes: Pupils are equal, round, and reactive to light.  Neck: Normal range of motion. Neck supple.  Cardiovascular: Normal rate, regular rhythm and normal heart sounds.   No murmur heard. Pulmonary/Chest: Effort normal and breath sounds normal. No respiratory  distress. She has no wheezes. She has no rales.  Abdominal: Soft. She exhibits no distension. There is no tenderness.  Musculoskeletal: She exhibits no edema.       Left arm exam: Slight decrease in strength of left hand grip compared to right hand.  Normal sensation.  No redness or swelling of joints.  Full rom of wrist, elbow, and shoulder.  No pain on palpation.    Lymphadenopathy:    She has no cervical adenopathy.  Neurological: She is alert.  Skin: Skin is warm.  Psychiatric: She has a normal mood and affect. Her behavior is normal.          Assessment & Plan:

## 2012-03-15 ENCOUNTER — Encounter: Payer: Self-pay | Admitting: Family Medicine

## 2012-03-15 DIAGNOSIS — R200 Anesthesia of skin: Secondary | ICD-10-CM | POA: Insufficient documentation

## 2012-03-15 DIAGNOSIS — D649 Anemia, unspecified: Secondary | ICD-10-CM | POA: Insufficient documentation

## 2012-03-15 NOTE — Assessment & Plan Note (Signed)
Hbg 10.3 in hospital.  Pt started on iron- will f/up in 2 more weeks with Hgb to ensure that this is improving.

## 2012-03-15 NOTE — Assessment & Plan Note (Signed)
Seems to only occur when bp is elevated.  Has improved since d/c from hospital.  Pt to continue to monitor.  Return for recheck if new or worsening of symptoms.  Some slight weakness in left grip compared to right.  Concern when inpatient about possible tia/cva?- but CT negative and pt improving so did not move forward with MRI.  If symptoms do not resolve by 2 weeks- could consider possible MRI of head and neck.

## 2012-03-15 NOTE — Assessment & Plan Note (Addendum)
bp remains elevated.  Pt to continue hctz, and lisinopril.  Will also add norvasc 5mg .  Encouraged pt to reduce salt intake (handout given).  Also encouraged daily exercise.  Some mild elevation in creatinine at last check in hospital.  Will recheck creatinine in 2 weeks to make sure that this is improving.

## 2012-03-15 NOTE — Assessment & Plan Note (Signed)
Will need f/up bmet to recheck K at 2 week f/up visit.

## 2012-03-29 ENCOUNTER — Ambulatory Visit: Payer: PRIVATE HEALTH INSURANCE | Admitting: Family Medicine

## 2012-03-30 ENCOUNTER — Encounter: Payer: Self-pay | Admitting: Family Medicine

## 2012-03-30 ENCOUNTER — Ambulatory Visit (INDEPENDENT_AMBULATORY_CARE_PROVIDER_SITE_OTHER): Payer: PRIVATE HEALTH INSURANCE | Admitting: Family Medicine

## 2012-03-30 VITALS — BP 140/98 | HR 87 | Ht 61.0 in | Wt 154.0 lb

## 2012-03-30 DIAGNOSIS — I10 Essential (primary) hypertension: Secondary | ICD-10-CM

## 2012-03-30 DIAGNOSIS — M6281 Muscle weakness (generalized): Secondary | ICD-10-CM

## 2012-03-30 DIAGNOSIS — R29898 Other symptoms and signs involving the musculoskeletal system: Secondary | ICD-10-CM | POA: Insufficient documentation

## 2012-03-30 NOTE — Patient Instructions (Signed)
Hypertension: Continue current medicine regimen.  Keep up great work with exercise.   Return in 1-2 for complete physical.

## 2012-03-30 NOTE — Assessment & Plan Note (Signed)
This is per pt report.  No abnormality found on exam.  Could be a mild muscular injury/strain in left hand that I could not identify on exam today- that is causing patient symptoms.  Could be small cva event that occurred during hypertensive emergency during hospitalization 2 months ago that caused this.  --pt function not impaired.  Will continue to follow symptoms and maintain tight bp control.

## 2012-03-30 NOTE — Progress Notes (Signed)
  Subjective:    Patient ID: Jillian Vasquez, female    DOB: 01/03/1976, 36 y.o.   MRN: [id number]  HPI Hypertension: Patient reports that at home blood pressure has been in the 130s systolic consistently-and in the 80s diastolic consistently. Patient has been using a pill box to help her keep her medication straight. Also family is very supportive and reminds her daily to take her medications. Left arm numbness now resolved.  Exercising 2-3 times per week-30 minutes walking. Trying to eat healthy diet. Taking all medications as directed. No side effects her medications. No headaches. A chest pain. No short of breath. No dizziness. No vision changes.  Left hand weakness: Left arm tingling now resolved. Patient states she feels like her grip in her left hand is slightly weaker than her right. Still able to do everything that she needs to do. Has not impaired her function. But has noticed the simple difference. This left hand weakness seemed to have been present a couple of weeks prior to hospitalization. Has not been dropping things. No joint redness. No joint swelling. No rashes.  Smoking status reviewed.   Review of Systems As per above    Objective:   Physical Exam  Constitutional: She appears well-developed and well-nourished.  HENT:  Head: Normocephalic.  Eyes: Pupils are equal, round, and reactive to light.  Neck: Normal range of motion. No thyromegaly present.  Cardiovascular: Normal rate, regular rhythm and normal heart sounds.   No murmur heard. Pulmonary/Chest: Effort normal and breath sounds normal. No respiratory distress. She has no wheezes. She has no rales.  Musculoskeletal: She exhibits no edema.       Left hand exam: No redness. No swelling. Normal rom. Normal sensation. Normal strength in upper arms bilateral, grip bilateral, and fingers bilateral.  No asymetry noted.  No pain on palpation.   Neurological: She is alert.  Skin: No rash noted.  Psychiatric: She has  a normal mood and affect.          Assessment & Plan:

## 2012-03-30 NOTE — Assessment & Plan Note (Signed)
Continue on current regimen.  Although mildly elevated today at 140/98- at home has been consistently 130's/80's.  Pt feeling much better.  Encouraged continued exercise.  Pt to monitor at home.  Return in 1-2 months for f/up- pt due for physical exam.

## 2012-04-11 ENCOUNTER — Ambulatory Visit (INDEPENDENT_AMBULATORY_CARE_PROVIDER_SITE_OTHER): Payer: PRIVATE HEALTH INSURANCE | Admitting: Obstetrics and Gynecology

## 2012-04-11 ENCOUNTER — Encounter: Payer: Self-pay | Admitting: Obstetrics and Gynecology

## 2012-04-11 VITALS — BP 130/90 | HR 106 | Temp 98.5°F | Ht 60.0 in | Wt 154.0 lb

## 2012-04-11 DIAGNOSIS — N751 Abscess of Bartholin's gland: Secondary | ICD-10-CM

## 2012-04-11 MED ORDER — DOXYCYCLINE HYCLATE 50 MG PO CAPS: 50.0000 mg | ORAL_CAPSULE | Freq: Two times a day (BID) | ORAL | Status: AC

## 2012-04-11 NOTE — Progress Notes (Signed)
  Subjective:    Patient ID: Jillian Vasquez, female    DOB: [birth year], 36 y.o.   MRN: [id number]  HPI 36 yo with LMP 03/28/2012 and BMI 30 presenting today for evaluation of a recurrent left Bartholin abscess. Patient has them on the left side at least 3-4 times a year. Patient reports this abcess has been present for the past 3 days. Patient is otherwise without complaints   Past Medical History  Diagnosis Date  . Asthma   . Hypertension   . Sinus headache   . Headache disorder     "from my BP"  . Hypertensive emergency without congestive heart failure 03/06/2012   Past Surgical History  Procedure Date  . Tubal ligation 2000  . Cesarean section 2000   Family History  Problem Relation Age of Onset  . Hypertension Mother   . Hypertension Father   . Hypertension Maternal Aunt   . Hypertension Maternal Uncle    History  Substance Use Topics  . Smoking status: Never Smoker   . Smokeless tobacco: Never Used  . Alcohol Use: Yes     03/06/12 "special occasions"      Review of Systems  All other systems reviewed and are negative.       Objective:   Physical Exam  GENERAL: Well-developed, well-nourished female in no acute distress.  ABDOMEN: Soft, nontender, nondistended. No organomegaly. PELVIC: Normal external female genitalia. 3 cm fluctuant Bartholin abscess on left side EXTREMITIES: No cyanosis, clubbing, or edema, 2+ distal pulses.     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

## 2012-04-11 NOTE — Patient Instructions (Signed)
Bartholin's Cyst or Abscess Bartholin's glands are small glands located within the folds of skin (labia) along the sides of the lower opening of the vagina (birth canal). A cyst may develop when the duct of the gland becomes blocked. When this happens, fluid that accumulates within the cyst can become infected. This is known as an abscess. The Bartholin gland produces a mucous fluid to lubricate the outside of the vagina during sexual intercourse. SYMPTOMS   Patients with a small cyst may not have any symptoms.   Mild discomfort to severe pain depending on the size of the cyst and if it is infected (abscess).   Pain, redness, and swelling around the lower opening of the vagina.   Painful intercourse.   Pressure in the perineal area.   Swelling of the lips of the vagina (labia).   The cyst or abscess can be on one side or both sides of the vagina.  DIAGNOSIS   A large swelling is seen in the lower vagina area by your caregiver.   Painful to touch.   Redness and pain, if it is an abscess.  TREATMENT   Sometimes the cyst will go away on its own.   Apply warm wet compresses to the area or take hot sitz baths several times a day.   An incision to drain the cyst or abscess with local anesthesia.   Culture the pus, if it is an abscess.   Antibiotic treatment, if it is an abscess.   Cut open the gland and suture the edges to make the opening of the gland bigger (marsupialization).   Remove the whole gland if the cyst or abscess returns.  PREVENTION   Practice good hygiene.   Clean the vaginal area with a mild soap and soft cloth when bathing.   Do not rub hard in the vaginal area when bathing.   Protect the crotch area with a padded cushion if you take long bike rides or ride horses.   Be sure you are well lubricated when you have sexual intercourse.  HOME CARE INSTRUCTIONS   If your cyst or abscess was opened, a small piece of gauze, or a drain, may have been placed in  the wound to allow drainage. Do not remove this gauze or drain unless directed by your caregiver.   Wear feminine pads, not tampons, as needed for any drainage or bleeding.   If antibiotics were prescribed, take them exactly as directed. Finish the entire course.   Only take over-the-counter or prescription medicines for pain, discomfort, or fever as directed by your caregiver.  SEEK IMMEDIATE MEDICAL CARE IF:   You have an increase in pain, redness, swelling, or drainage.   You have bleeding from the wound which results in the use of more than the number of pads suggested by your caregiver in 24 hours.   You have chills.   You have a fever.   You develop any new problems (symptoms) or aggravation of your existing condition.  MAKE SURE YOU:   Understand these instructions.   Will watch your condition.   Will get help right away if you are not doing well or get worse.  Document Released: 10/10/2005 Document Revised: 09/29/2011 Document Reviewed: 05/28/2008 [employer] Patient Information 2012 [employer]. 

## 2012-06-19 ENCOUNTER — Other Ambulatory Visit: Payer: Self-pay | Admitting: *Deleted

## 2012-06-19 MED ORDER — AMLODIPINE BESYLATE 5 MG PO TABS: 5.0000 mg | ORAL_TABLET | Freq: Every day | ORAL | Status: DC

## 2012-06-26 ENCOUNTER — Ambulatory Visit: Payer: No Typology Code available for payment source | Admitting: Family Medicine

## 2012-08-24 ENCOUNTER — Telehealth: Payer: Self-pay | Admitting: Family Medicine

## 2012-08-24 NOTE — Telephone Encounter (Signed)
All of her meds have no refills and is changing to [facility]

## 2012-08-24 NOTE — Telephone Encounter (Signed)
I have never seen the patient and don't know specifically which medications she is taking. Please call and ask her which medications she is taking. Thank you.

## 2012-08-24 NOTE — Telephone Encounter (Signed)
Message left on voicemail that we need her to call back with medications and dosages. Ask for [employer] . Also she needs to schedule appointment.

## 2012-08-24 NOTE — Telephone Encounter (Signed)
Will forward message to Dr. Clinton Sawyer. Amlodipine does have refills Message left for patient that we will call her when refills have been sent and she can get amlodipine refilled now.

## 2012-08-27 ENCOUNTER — Ambulatory Visit: Payer: No Typology Code available for payment source | Admitting: Family Medicine

## 2012-08-28 MED ORDER — LISINOPRIL 40 MG PO TABS: 40.0000 mg | ORAL_TABLET | Freq: Every day | ORAL | Status: DC

## 2012-08-28 MED ORDER — HYDROCHLOROTHIAZIDE 25 MG PO TABS: 25.0000 mg | ORAL_TABLET | Freq: Every day | ORAL | Status: DC

## 2012-08-28 MED ORDER — FERROUS SULFATE 325 (65 FE) MG PO TABS: 325.0000 mg | ORAL_TABLET | Freq: Every day | ORAL | Status: DC

## 2012-08-28 NOTE — Telephone Encounter (Signed)
Meds refilled for 60 day supply. Patient will need appointment before any more give. Thanks.

## 2012-08-28 NOTE — Telephone Encounter (Signed)
Spoke with patient. She needs refill on ferrous sulfate, lisinopril , and HCTZ . Doses are as stated  on med list. Appointment scheduled to meet PCP 10/03/2012. Will forward to MD. Send to  [facility] [address]

## 2012-09-03 ENCOUNTER — Ambulatory Visit (INDEPENDENT_AMBULATORY_CARE_PROVIDER_SITE_OTHER): Payer: Self-pay | Admitting: Family Medicine

## 2012-09-03 ENCOUNTER — Encounter: Payer: Self-pay | Admitting: Family Medicine

## 2012-09-03 VITALS — BP 190/120 | HR 103 | Temp 98.2°F | Ht 61.0 in | Wt 150.2 lb

## 2012-09-03 DIAGNOSIS — J019 Acute sinusitis, unspecified: Secondary | ICD-10-CM | POA: Insufficient documentation

## 2012-09-03 DIAGNOSIS — I10 Essential (primary) hypertension: Secondary | ICD-10-CM

## 2012-09-03 MED ORDER — AMOXICILLIN-POT CLAVULANATE 875-125 MG PO TABS: 1.0000 | ORAL_TABLET | Freq: Two times a day (BID) | ORAL | Status: DC

## 2012-09-03 MED ORDER — AMLODIPINE BESYLATE 10 MG PO TABS: 10.0000 mg | ORAL_TABLET | Freq: Every day | ORAL | Status: DC

## 2012-09-03 NOTE — Progress Notes (Signed)
Patient ID: Jillian Vasquez, female   DOB: [birth year], 36 y.o.   MRN: [id number] 1. SINUSITIS  Onset: 8 days ago Severity: Moderately severe Currently using OTC analgesics for pain control.  Symptoms Cough: yes, mild Runny nose: yes, purulent Fever: yes   Highest Temp: 101.5 Sinus Pressure: yes, frontal and maxillary.  Frontal >Maxillary  Ears Blocked: yes  Teeth Ache: no  Frontal Headache: yes  Second sickening: yes, initially was improving after a few days then worsened.   PMH Sinusitis or Recurrent OM: no  PMH Prior Sinus or Ear Surgery: no  Recent antibiotic usage (last 30 days): no  PMH of Diabetes or Immunocompromise: no    Red flags Change in mental state: no Change in vision: no Rash: no  2. HTN:  BP elevated today.  She is compliant with her current medications.  She has been avoiding decongestants but she did use alka seltzer sinus once, unsure if there was a decongestant in this.  She does endorse frontal headache but no neurological or vision changes.  Denies chest pain, dizziness, shortness of breath.    Physical Exam  Constitutional: She appears well-developed and well-nourished.       Appears uncomfortable   HENT:  Head: Normocephalic and atraumatic.  Right Ear: Tympanic membrane normal.  Left Ear: Tympanic membrane normal.  Nose: Rhinorrhea (Purulent) present. Right sinus exhibits maxillary sinus tenderness (mild) and frontal sinus tenderness (severe). Left sinus exhibits maxillary sinus tenderness (mild) and frontal sinus tenderness (mild).  Mouth/Throat: Oropharynx is clear and moist.  Neck: Neck supple.  Pulmonary/Chest: Effort normal and breath sounds normal.  Lymphadenopathy:    She has no cervical adenopathy.

## 2012-09-03 NOTE — Assessment & Plan Note (Signed)
BP significantly elevated.  Will increased amlodipine to 10mg  daily, follow up with pcp in one week.  Advised to avoid decongestant cold medications.

## 2012-09-03 NOTE — Assessment & Plan Note (Signed)
Given focal tenderness over R frontal sinus, fever and second sickening with purulent drainage will treat as acute sinusitis and rx augmentin.  Given red flags and instructed to return if worsening or fails to improve.

## 2012-09-03 NOTE — Patient Instructions (Addendum)

## 2012-10-03 ENCOUNTER — Ambulatory Visit: Payer: No Typology Code available for payment source | Admitting: Family Medicine

## 2012-10-22 ENCOUNTER — Encounter: Payer: Self-pay | Admitting: Family Medicine

## 2012-10-22 ENCOUNTER — Ambulatory Visit (INDEPENDENT_AMBULATORY_CARE_PROVIDER_SITE_OTHER): Payer: Self-pay | Admitting: Family Medicine

## 2012-10-22 ENCOUNTER — Telehealth: Payer: Self-pay | Admitting: *Deleted

## 2012-10-22 VITALS — BP 168/100 | HR 82 | Ht 61.0 in | Wt 150.8 lb

## 2012-10-22 DIAGNOSIS — I10 Essential (primary) hypertension: Secondary | ICD-10-CM

## 2012-10-22 DIAGNOSIS — D509 Iron deficiency anemia, unspecified: Secondary | ICD-10-CM

## 2012-10-22 DIAGNOSIS — J309 Allergic rhinitis, unspecified: Secondary | ICD-10-CM

## 2012-10-22 LAB — BASIC METABOLIC PANEL
BUN: 12 mg/dL (ref 6–23)
Chloride: 105 mEq/L (ref 96–112)
Creat: 0.89 mg/dL (ref 0.50–1.10)
Glucose, Bld: 102 mg/dL — ABNORMAL HIGH (ref 70–99)

## 2012-10-22 LAB — CBC
HCT: 35.5 % — ABNORMAL LOW (ref 36.0–46.0)
MCHC: 29.9 g/dL — ABNORMAL LOW (ref 30.0–36.0)
MCV: 69.1 fL — ABNORMAL LOW (ref 78.0–100.0)
RDW: 18 % — ABNORMAL HIGH (ref 11.5–15.5)
WBC: 5.3 10*3/uL (ref 4.0–10.5)

## 2012-10-22 MED ORDER — HYDROCHLOROTHIAZIDE 25 MG PO TABS: 50.0000 mg | ORAL_TABLET | Freq: Every day | ORAL | Status: DC

## 2012-10-22 MED ORDER — LORATADINE 10 MG PO TABS: 10.0000 mg | ORAL_TABLET | Freq: Every day | ORAL | Status: DC

## 2012-10-22 NOTE — Telephone Encounter (Signed)
Please let the patient know that this study is not critical to have right now. It is something that can be done in the future. The most important thing is that she take the new medications. Thank you.

## 2012-10-22 NOTE — Progress Notes (Signed)
  Subjective:    Patient ID: Jillian Vasquez, female    DOB: 01/31/1976, 36 y.o.   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   Hypertension  Home BP monitoring:  BP Readings from Last 3 Encounters:  10/22/12 168/100  09/03/12 190/120  04/11/12 130/90    Prescribed meds: Amlodipine 10 mg, HCTZ 25 mg, Lisinopril 40 mg, but Not taking HCTZ   PMH: noted to have BP since 1998 Fam History: mother and father with HTN; Aunt and Maternal Grandmother with history of breast cancer  Social: runs a day care    Review of Systems Positive for hot flashes at night for 2 months, intermittent numbness of left arm, congestion, intermittent fever Negative for chest pain, shortness of breath, swelling of ankles     Objective:   Physical Exam BP 168/100  Pulse 82  Ht 5\' 1"  (1.549 m)  Wt 150 lb 12.8 oz (68.402 kg)  BMI 28.49 kg/m2  LMP 10/15/2012 Gen: AAF female, well appearing, NAD, pleasant and conversant HEENT: NCAT, PERRLA, EOMI, OP clear and moist, no lymphadenopathy, no thyroid tenderness, enlargement, or nodules CV: RRR, no m/r/g, no JVD or carotid bruits Pulm: normal WOB, CTA-B Abd: soft, NDNT, NABS Extremities: no edema or joint tenderness Skin: warm, dry, no rashes Neuro/Psych: A&Ox4, normal affect, speech, and thought content      Assessment & Plan:  36 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

## 2012-10-22 NOTE — Telephone Encounter (Signed)
Called pt. Informed. She will call [facility] and cancel the appt. She will take her new HTN meds. Lorenda Hatchet, Renato Battles

## 2012-10-22 NOTE — Telephone Encounter (Signed)
Called pt. Re: renal US. appt at [facility], [address] location 10-30-12 at 8:30 am.  Cost $423.20 this is a 60% discount ($1,058) Prep: day before appt ONLY CLEAR LIQUIDS AND DO NOT EAT OR DRINK AFTER MIDNIGHT. We got disconnected before I could tell pt the Prep. Pt said, that she wants to know, if Dr.Williamson wants her to have the [place]. She is in the process of applying for insurance. Fwd. To Dr.Williamson for review. Lorenda Hatchet, Renato Battles

## 2012-10-22 NOTE — Patient Instructions (Addendum)
Dear Mrs. Jillian Vasquez,   Thank you for coming to clinic today. Please read below regarding the issues that we discussed.   1. High Blood Pressure - Start the HCTZ 2 times a day. Also, we will get an ultrasound of your kidneys.   2. Birth Control - We can discuss this at your next visit.   Please follow up in clinic in 2 weeks. Please call earlier if you have any questions or concerns.   Sincerely,   Dr. Clinton Sawyer

## 2012-10-22 NOTE — Telephone Encounter (Signed)
Left message for patient to return call. Please find out patient's insurance, if she doesn't have any does she still want to have her [place] scheduled. She really needs to have this done.Busick, Rodena Medin

## 2012-10-24 NOTE — Assessment & Plan Note (Addendum)
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 Patient not compliant on current regimen. She will start HCTZ 50 mg daily. Cont amlodipine 10 mg and lisinopril 40 mg. Check renal ultrasound. F/u in 2 weeks.

## 2012-10-30 ENCOUNTER — Other Ambulatory Visit: Payer: Self-pay

## 2012-10-31 ENCOUNTER — Encounter: Payer: Self-pay | Admitting: Family Medicine

## 2012-11-08 ENCOUNTER — Ambulatory Visit: Payer: Self-pay | Admitting: Family Medicine

## 2012-12-10 ENCOUNTER — Other Ambulatory Visit: Payer: Self-pay

## 2013-01-02 ENCOUNTER — Ambulatory Visit (INDEPENDENT_AMBULATORY_CARE_PROVIDER_SITE_OTHER): Payer: Self-pay | Admitting: Family Medicine

## 2013-01-02 ENCOUNTER — Encounter: Payer: Self-pay | Admitting: Family Medicine

## 2013-01-02 ENCOUNTER — Telehealth: Payer: Self-pay | Admitting: Family Medicine

## 2013-01-02 VITALS — BP 154/91 | HR 76 | Temp 98.0°F | Ht 61.0 in | Wt 148.1 lb

## 2013-01-02 DIAGNOSIS — J45909 Unspecified asthma, uncomplicated: Secondary | ICD-10-CM | POA: Insufficient documentation

## 2013-01-02 DIAGNOSIS — R0602 Shortness of breath: Secondary | ICD-10-CM

## 2013-01-02 MED ORDER — ALBUTEROL SULFATE (2.5 MG/3ML) 0.083% IN NEBU: 2.5000 mg | INHALATION_SOLUTION | Freq: Four times a day (QID) | RESPIRATORY_TRACT | Status: DC | PRN

## 2013-01-02 MED ORDER — NEBULIZER COMPRESSOR KIT: PACK | Status: DC

## 2013-01-02 NOTE — Telephone Encounter (Signed)
I received a fax this evening that patient's pharmacy did not have albuterol nebulizer kit. Therefore, I called patient to see if it was needed and what pharmacy she would like to use. She did not answer, so I left a message asking her to call the resident on-call tonight if she needs another prescription sent to a different pharmacy.

## 2013-01-02 NOTE — Patient Instructions (Addendum)
Thank you for coming in today, it was good to see you I have sent in a prescription for a nebulizer machine and albuterol Have your chest x ray done and follow up with Dr. Clinton Sawyer if your symptoms do not improve.

## 2013-01-03 DIAGNOSIS — R0602 Shortness of breath: Secondary | ICD-10-CM | POA: Insufficient documentation

## 2013-01-03 NOTE — Progress Notes (Signed)
  Subjective:    Patient ID: Jillian Vasquez, female    DOB: 11-14-1975, 37 y.o.   MRN: [id number]  HPI  1. Wheezing:  Here with c/o of frequent wheezing and shortness of breath.  Reports history of asthma and uses albuterol mdi when she has symptoms.  She does not feel like her inhaler is working as well as it used to.  She reports she is using with a spacer.  She had an episode of shortness of breath at work the other day and couldn't make it back to her desk.  A friend at work had a nebulizer they brought her and this relieved her symptoms much better than her mdi.  She has not had any wheezing since using nebulizer yesterday.  She does endorse fever yesterday with cough and congestion.  Tmax at home was 101.1.  She does feel somewhat better today.    Review of Systems Per HPI    Objective:   Physical Exam  Constitutional: She appears well-nourished. No distress.  HENT:  Head: Atraumatic.  Neck: Neck supple.  Cardiovascular: Normal rate and regular rhythm.   Pulmonary/Chest: Breath sounds normal. No respiratory distress. She has no wheezes.  Musculoskeletal: She exhibits no edema.  Neurological: She is alert.          Assessment & Plan:

## 2013-01-03 NOTE — Assessment & Plan Note (Signed)
Patient with intermittent shortness of breath and wheezing.  Fever with shortness of breath yesterday concerning for possible PNA.  Pulmonary exam normal today without wheezing.  Will order CXR and discussed with her that she could continue to use mdi with mask.  She did not feel comfortable using mdi because she feels that it does not work and is insistent on having a nebulizer.  Will send in rx for nebulizer.

## 2013-02-25 ENCOUNTER — Other Ambulatory Visit: Payer: Self-pay | Admitting: *Deleted

## 2013-02-26 MED ORDER — LISINOPRIL 40 MG PO TABS: 40.0000 mg | ORAL_TABLET | Freq: Every day | ORAL | Status: DC

## 2013-05-20 ENCOUNTER — Other Ambulatory Visit: Payer: Self-pay | Admitting: *Deleted

## 2013-05-20 DIAGNOSIS — I10 Essential (primary) hypertension: Secondary | ICD-10-CM

## 2013-05-27 MED ORDER — HYDROCHLOROTHIAZIDE 25 MG PO TABS: 50.0000 mg | ORAL_TABLET | Freq: Every day | ORAL | Status: DC

## 2013-07-04 ENCOUNTER — Encounter (HOSPITAL_COMMUNITY): Payer: Self-pay | Admitting: Emergency Medicine

## 2013-07-04 ENCOUNTER — Emergency Department (HOSPITAL_COMMUNITY)
Admission: EM | Admit: 2013-07-04 | Discharge: 2013-07-04 | Disposition: A | Discharge status: Home / Self Care | Payer: BC Managed Care – PPO | Attending: Emergency Medicine | Admitting: Emergency Medicine

## 2013-07-04 DIAGNOSIS — Z8669 Personal history of other diseases of the nervous system and sense organs: Secondary | ICD-10-CM | POA: Insufficient documentation

## 2013-07-04 DIAGNOSIS — I10 Essential (primary) hypertension: Secondary | ICD-10-CM

## 2013-07-04 DIAGNOSIS — R209 Unspecified disturbances of skin sensation: Secondary | ICD-10-CM | POA: Insufficient documentation

## 2013-07-04 DIAGNOSIS — M79609 Pain in unspecified limb: Secondary | ICD-10-CM | POA: Insufficient documentation

## 2013-07-04 DIAGNOSIS — Z79899 Other long term (current) drug therapy: Secondary | ICD-10-CM | POA: Insufficient documentation

## 2013-07-04 DIAGNOSIS — J45909 Unspecified asthma, uncomplicated: Secondary | ICD-10-CM | POA: Insufficient documentation

## 2013-07-04 LAB — CBC WITH DIFFERENTIAL/PLATELET
Basophils Absolute: 0 10*3/uL (ref 0.0–0.1)
Eosinophils Absolute: 0.5 10*3/uL (ref 0.0–0.7)
Eosinophils Relative: 11 % — ABNORMAL HIGH (ref 0–5)
MCH: 20.9 pg — ABNORMAL LOW (ref 26.0–34.0)
Monocytes Absolute: 0.4 10*3/uL (ref 0.1–1.0)
Neutrophils Relative %: 39 % — ABNORMAL LOW (ref 43–77)
Platelets: 344 10*3/uL (ref 150–400)
RBC: 4.83 MIL/uL (ref 3.87–5.11)
RDW: 17.6 % — ABNORMAL HIGH (ref 11.5–15.5)

## 2013-07-04 LAB — POCT I-STAT, CHEM 8
BUN: 12 mg/dL (ref 6–23)
Calcium, Ion: 1.16 mmol/L (ref 1.12–1.23)
HCT: 35 % — ABNORMAL LOW (ref 36.0–46.0)
Hemoglobin: 11.9 g/dL — ABNORMAL LOW (ref 12.0–15.0)
Sodium: 137 mEq/L (ref 135–145)
TCO2: 22 mmol/L (ref 0–100)

## 2013-07-04 LAB — POCT I-STAT TROPONIN I

## 2013-07-04 NOTE — ED Notes (Signed)
Pt has hx of hypertension and missed one dose of meds.  Pt reports odd numbness and slight tingling in left are.  Headache 5/10.  Pt alert oriented X4

## 2013-07-04 NOTE — ED Provider Notes (Signed)
CSN: [id number]     Arrival date & time 07/04/13  0911 History   First MD Initiated Contact with Patient 07/04/13 [phone]     Chief Complaint  Patient presents with  . Hypertension  . Arm Pain   (Consider location/radiation/quality/duration/timing/severity/associated sxs/prior Treatment) HPI Jillian Vasquez is a 37 y.o. female who presents to ED with complaint of elevated BP and left arm tingling. Patient states she had a stressful day at work yesterday and forgot to take her medications in the morning. She states that she got home at night time and realized she did not take her blood pressure medicines. Patient states she measured her blood pressure and it was 150/90. Patient states she didn't take all of her medications. Patient states that she took her medications around midnight. States she rechecked her blood pressure shortly after that and it was down to 130/80. Patient states that she woke up this morning feeling well. She states that she was getting ready for work when she started feeling that her left upper arm was heavy and felt tingly. Patient denies ever feeling the same before. States she sat down and measured her blood pressure and it was again 150s over 90s. Patient called her primary care doctor was told to come to emergency department. Patient reports that she felt well other than left arm heaviness. Patient denies any dizziness, chest pain, shortness of breath, headache. Patient has no prior cardiac history. Past Medical History  Diagnosis Date  . Asthma   . Hypertension   . Sinus headache   . Headache disorder     "from my BP"  . Hypertensive emergency without congestive heart failure 03/06/2012   Past Surgical History  Procedure Laterality Date  . Tubal ligation  2000  . Cesarean section  2000   Family History  Problem Relation Age of Onset  . Hypertension Mother   . Hypertension Father   . Hypertension Maternal Aunt   . Hypertension Maternal Uncle    History   Substance Use Topics  . Smoking status: Never Smoker   . Smokeless tobacco: Never Used  . Alcohol Use: Yes     Comment: 03/06/12 "special occasions"   OB History   Grav Para Term Preterm Abortions TAB SAB Ect Mult Living                 Review of Systems  Constitutional: Negative for fever and chills.  HENT: Negative for neck pain and neck stiffness.   Respiratory: Negative for cough, chest tightness and shortness of breath.   Cardiovascular: Negative for chest pain, palpitations and leg swelling.  Gastrointestinal: Negative for nausea, vomiting, abdominal pain and diarrhea.  Genitourinary: Negative for dysuria, flank pain, vaginal bleeding, vaginal discharge, vaginal pain and pelvic pain.  Musculoskeletal: Negative for myalgias and arthralgias.  Skin: Negative for rash.  Neurological: Negative for dizziness, weakness and headaches.  All other systems reviewed and are negative.    Allergies  Review of patient's allergies indicates no known allergies.  Home Medications   Current Outpatient Rx  Name  Route  Sig  Dispense  Refill  . albuterol (PROVENTIL HFA;VENTOLIN HFA) 108 (90 BASE) MCG/ACT inhaler   Inhalation   Inhale 2 puffs into the lungs every 6 (six) hours as needed. For wheezing         . albuterol (PROVENTIL) (2.5 MG/3ML) 0.083% nebulizer solution   Nebulization   Take 3 mLs (2.5 mg total) by nebulization every 6 (six) hours as needed for wheezing.  75 mL   12   . amLODipine (NORVASC) 10 MG tablet   Oral   Take 1 tablet (10 mg total) by mouth daily.   30 tablet   5   . ferrous sulfate 325 (65 FE) MG tablet   Oral   Take 1 tablet (325 mg total) by mouth daily with breakfast.   30 tablet   1   . fluticasone (FLONASE) 50 MCG/ACT nasal spray   Nasal   Place 2 sprays into the nose daily.   16 g   2   . hydrochlorothiazide (HYDRODIURIL) 25 MG tablet   Oral   Take 2 tablets (50 mg total) by mouth daily.   60 tablet   5   . lisinopril  (PRINIVIL,ZESTRIL) 40 MG tablet   Oral   Take 1 tablet (40 mg total) by mouth daily.   30 tablet   11   . loratadine (CLARITIN) 10 MG tablet   Oral   Take 1 tablet (10 mg total) by mouth daily.   30 tablet   3   . Respiratory Therapy Supplies (NEBULIZER COMPRESSOR) KIT      Use with albuterol PRN for wheezing or shortness of breath.   1 each   0    BP 175/107  Pulse 80  Temp(Src) 98.4 F (36.9 C) (Oral)  Resp 18  SpO2 99% Physical Exam  Nursing note and vitals reviewed. Constitutional: She is oriented to person, place, and time. She appears well-developed and well-nourished. No distress.  HENT:  Head: Normocephalic.  Eyes: Conjunctivae are normal.  Neck: Neck supple.  Cardiovascular: Normal rate, regular rhythm and normal heart sounds.   Distal radial and dorsal pedal pulses intact bilaterally  Pulmonary/Chest: Effort normal and breath sounds normal. No respiratory distress. She has no wheezes. She has no rales.  Abdominal: Soft. Bowel sounds are normal. She exhibits no distension. There is no tenderness. There is no rebound.  Musculoskeletal: She exhibits no edema.  Neurological: She is alert and oriented to person, place, and time. No cranial nerve deficit. Coordination normal.  5 out of 5 and equal grip strength bilaterally  Skin: Skin is warm and dry.  Psychiatric: She has a normal mood and affect. Her behavior is normal.    ED Course  Procedures (including critical care time)   Date: 07/04/2013  Rate: 72  Rhythm: normal sinus rhythm  QRS Axis: normal  Intervals: normal  ST/T Wave abnormalities: normal  Conduction Disutrbances:none  Narrative Interpretation:   Old EKG Reviewed: none available  Results for orders placed during the hospital encounter of 07/04/13  CBC WITH DIFFERENTIAL      Result Value Range   WBC 4.4  4.0 - 10.5 K/uL   RBC 4.83  3.87 - 5.11 MIL/uL   Hemoglobin 10.1 (*) 12.0 - 15.0 g/dL   HCT 16.1 (*) 09.6 - 04.5 %   MCV 67.9 (*) 78.0  - 100.0 fL   MCH 20.9 (*) 26.0 - 34.0 pg   MCHC 30.8  30.0 - 36.0 g/dL   RDW 40.9 (*) 81.1 - 91.4 %   Platelets 344  150 - 400 K/uL   Neutrophils Relative % 39 (*) 43 - 77 %   Lymphocytes Relative 39  12 - 46 %   Monocytes Relative 10  3 - 12 %   Eosinophils Relative 11 (*) 0 - 5 %   Basophils Relative 1  0 - 1 %   Neutro Abs 1.8  1.7 - 7.7 K/uL  Lymphs Abs 1.7  0.7 - 4.0 K/uL   Monocytes Absolute 0.4  0.1 - 1.0 K/uL   Eosinophils Absolute 0.5  0.0 - 0.7 K/uL   Basophils Absolute 0.0  0.0 - 0.1 K/uL   RBC Morphology POLYCHROMASIA PRESENT    POCT I-STAT, CHEM 8      Result Value Range   Sodium 137  135 - 145 mEq/L   Potassium 4.1  3.5 - 5.1 mEq/L   Chloride 103  96 - 112 mEq/L   BUN 12  6 - 23 mg/dL   Creatinine, Ser 1.61 (*) 0.50 - 1.10 mg/dL   Glucose, Bld 95  70 - 99 mg/dL   Calcium, Ion 0.96  0.45 - 1.23 mmol/L   TCO2 22  0 - 100 mmol/L   Hemoglobin 11.9 (*) 12.0 - 15.0 g/dL   HCT 40.9 (*) 81.1 - 91.4 %  POCT I-STAT TROPONIN I      Result Value Range   Troponin i, poc 0.01  0.00 - 0.08 ng/mL   Comment 3               MDM   1. Hypertension     Patient with elevated blood pressure and left arm discomfort. Patient denies any chest pain or shortness of breath. She denies any headache. She appears to be in no distress, nontoxic. Her blood pressure initially is elevated 183/118. Patient admits to be under a lot of stress. I have ordered labs, EKG, troponin and the patient. Everything came back at her baseline. Troponin is negative and EKG is normal. Patient rested in her bed for about an hour and her blood pressure came to 157/103. I discussed pt with Dr. Anitra Lauth. At this time low suspicion for CAD. She is non toxic appearing. She is going to take her BP medications when she gets home, follow up with PCP closely.    Filed Vitals:   07/04/13 0921 07/04/13 0935 07/04/13 1000 07/04/13 1100  BP: 183/118 175/107 165/111 157/103  Pulse: 80  61 71  Temp: 98.4 F (36.9 C)      TempSrc: Oral     Resp: 18     SpO2: 100% 99% 100% 100%       Lottie Mussel, PA-C 07/04/13 1602

## 2013-07-04 NOTE — ED Notes (Signed)
Pt c/o left arm numbness and tingling; pt sts noted to be more hypertensive than normal despite taking meds

## 2013-07-06 NOTE — ED Provider Notes (Signed)
Medical screening examination/treatment/procedure(s) were performed by non-physician practitioner and as supervising physician I was immediately available for consultation/collaboration.   Gwyneth Sprout, MD 07/06/13 [phone]

## 2013-12-17 ENCOUNTER — Other Ambulatory Visit: Payer: Self-pay | Admitting: *Deleted

## 2013-12-17 MED ORDER — AMLODIPINE BESYLATE 10 MG PO TABS: 10.0000 mg | ORAL_TABLET | Freq: Every day | ORAL | Status: DC

## 2014-02-20 ENCOUNTER — Encounter (HOSPITAL_COMMUNITY): Payer: Self-pay | Admitting: Emergency Medicine

## 2014-02-20 ENCOUNTER — Emergency Department (HOSPITAL_COMMUNITY)
Admission: EM | Admit: 2014-02-20 | Discharge: 2014-02-20 | Disposition: A | Discharge status: Home / Self Care | Payer: No Typology Code available for payment source | Attending: Emergency Medicine | Admitting: Emergency Medicine

## 2014-02-20 DIAGNOSIS — J45909 Unspecified asthma, uncomplicated: Secondary | ICD-10-CM | POA: Insufficient documentation

## 2014-02-20 DIAGNOSIS — R638 Other symptoms and signs concerning food and fluid intake: Secondary | ICD-10-CM | POA: Insufficient documentation

## 2014-02-20 DIAGNOSIS — J329 Chronic sinusitis, unspecified: Secondary | ICD-10-CM | POA: Insufficient documentation

## 2014-02-20 DIAGNOSIS — Z79899 Other long term (current) drug therapy: Secondary | ICD-10-CM | POA: Insufficient documentation

## 2014-02-20 DIAGNOSIS — G479 Sleep disorder, unspecified: Secondary | ICD-10-CM | POA: Insufficient documentation

## 2014-02-20 DIAGNOSIS — I10 Essential (primary) hypertension: Secondary | ICD-10-CM | POA: Insufficient documentation

## 2014-02-20 DIAGNOSIS — IMO0002 Reserved for concepts with insufficient information to code with codable children: Secondary | ICD-10-CM | POA: Insufficient documentation

## 2014-02-20 DIAGNOSIS — J069 Acute upper respiratory infection, unspecified: Secondary | ICD-10-CM | POA: Insufficient documentation

## 2014-02-20 MED ORDER — GUAIFENESIN ER 600 MG PO TB12: 600.0000 mg | ORAL_TABLET | Freq: Two times a day (BID) | ORAL | Status: DC

## 2014-02-20 MED ORDER — MOMETASONE FUROATE 50 MCG/ACT NA SUSP: 2.0000 | Freq: Two times a day (BID) | NASAL | Status: DC

## 2014-02-20 NOTE — Discharge Instructions (Signed)
Use nasal spray as directed along with nasal saline. Take Mucinex as prescribed. Rest and stay well-hydrated. It is important to eat and sleep, do not overwork yourself.  Sinusitis Sinusitis is redness, soreness, and swelling (inflammation) of the paranasal sinuses. Paranasal sinuses are air pockets within the bones of your face (beneath the eyes, the middle of the forehead, or above the eyes). In healthy paranasal sinuses, mucus is able to drain out, and air is able to circulate through them by way of your nose. However, when your paranasal sinuses are inflamed, mucus and air can become trapped. This can allow bacteria and other germs to grow and cause infection. Sinusitis can develop quickly and last only a short time (acute) or continue over a long period (chronic). Sinusitis that lasts for more than 12 weeks is considered chronic.  CAUSES  Causes of sinusitis include:  Allergies.  Structural abnormalities, such as displacement of the cartilage that separates your nostrils (deviated septum), which can decrease the air flow through your nose and sinuses and affect sinus drainage.  Functional abnormalities, such as when the small hairs (cilia) that line your sinuses and help remove mucus do not work properly or are not present. SYMPTOMS  Symptoms of acute and chronic sinusitis are the same. The primary symptoms are pain and pressure around the affected sinuses. Other symptoms include:  Upper toothache.  Earache.  Headache.  Bad breath.  Decreased sense of smell and taste.  A cough, which worsens when you are lying flat.  Fatigue.  Fever.  Thick drainage from your nose, which often is green and may contain pus (purulent).  Swelling and warmth over the affected sinuses. DIAGNOSIS  Your caregiver will perform a physical exam. During the exam, your caregiver may:  Look in your nose for signs of abnormal growths in your nostrils (nasal polyps).  Tap over the affected sinus to  check for signs of infection.  View the inside of your sinuses (endoscopy) with a special imaging device with a light attached (endoscope), which is inserted into your sinuses. If your caregiver suspects that you have chronic sinusitis, one or more of the following tests may be recommended:  Allergy tests.  Nasal culture A sample of mucus is taken from your nose and sent to a lab and screened for bacteria.  Nasal cytology A sample of mucus is taken from your nose and examined by your caregiver to determine if your sinusitis is related to an allergy. TREATMENT  Most cases of acute sinusitis are related to a viral infection and will resolve on their own within 10 days. Sometimes medicines are prescribed to help relieve symptoms (pain medicine, decongestants, nasal steroid sprays, or saline sprays).  However, for sinusitis related to a bacterial infection, your caregiver will prescribe antibiotic medicines. These are medicines that will help kill the bacteria causing the infection.  Rarely, sinusitis is caused by a fungal infection. In theses cases, your caregiver will prescribe antifungal medicine. For some cases of chronic sinusitis, surgery is needed. Generally, these are cases in which sinusitis recurs more than 3 times per year, despite other treatments. HOME CARE INSTRUCTIONS   Drink plenty of water. Water helps thin the mucus so your sinuses can drain more easily.  Use a humidifier.  Inhale steam 3 to 4 times a day (for example, sit in the bathroom with the shower running).  Apply a warm, moist washcloth to your face 3 to 4 times a day, or as directed by your caregiver.  Use saline  nasal sprays to help moisten and clean your sinuses.  Take over-the-counter or prescription medicines for pain, discomfort, or fever only as directed by your caregiver. SEEK IMMEDIATE MEDICAL CARE IF:  You have increasing pain or severe headaches.  You have nausea, vomiting, or drowsiness.  You have  swelling around your face.  You have vision problems.  You have a stiff neck.  You have difficulty breathing. MAKE SURE YOU:   Understand these instructions.  Will watch your condition.  Will get help right away if you are not doing well or get worse. Document Released: 10/10/2005 Document Revised: 01/02/2012 Document Reviewed: 10/25/2011 [facility] Patient Information 2014 [place], [place].  Upper Respiratory Infection, Adult An upper respiratory infection (URI) is also sometimes known as the common cold. The upper respiratory tract includes the nose, sinuses, throat, trachea, and bronchi. Bronchi are the airways leading to the lungs. Most people improve within 1 week, but symptoms can last up to 2 weeks. A residual cough may last even longer.  CAUSES Many different viruses can infect the tissues lining the upper respiratory tract. The tissues become irritated and inflamed and often become very moist. Mucus production is also common. A cold is contagious. You can easily spread the virus to others by oral contact. This includes kissing, sharing a glass, coughing, or sneezing. Touching your mouth or nose and then touching a surface, which is then touched by another person, can also spread the virus. SYMPTOMS  Symptoms typically develop 1 to 3 days after you come in contact with a cold virus. Symptoms vary from person to person. They may include:  Runny nose.  Sneezing.  Nasal congestion.  Sinus irritation.  Sore throat.  Loss of voice (laryngitis).  Cough.  Fatigue.  Muscle aches.  Loss of appetite.  Headache.  Low-grade fever. DIAGNOSIS  You might diagnose your own cold based on familiar symptoms, since most people get a cold 2 to 3 times a year. Your caregiver can confirm this based on your exam. Most importantly, your caregiver can check that your symptoms are not due to another disease such as strep throat, sinusitis, pneumonia, asthma, or epiglottitis. Blood tests,  throat tests, and X-rays are not necessary to diagnose a common cold, but they may sometimes be helpful in excluding other more serious diseases. Your caregiver will decide if any further tests are required. RISKS AND COMPLICATIONS  You may be at risk for a more severe case of the common cold if you smoke cigarettes, have chronic heart disease (such as heart failure) or lung disease (such as asthma), or if you have a weakened immune system. The very young and very old are also at risk for more serious infections. Bacterial sinusitis, middle ear infections, and bacterial pneumonia can complicate the common cold. The common cold can worsen asthma and chronic obstructive pulmonary disease (COPD). Sometimes, these complications can require emergency medical care and may be life-threatening. PREVENTION  The best way to protect against getting a cold is to practice good hygiene. Avoid oral or hand contact with people with cold symptoms. Wash your hands often if contact occurs. There is no clear evidence that vitamin C, vitamin E, echinacea, or exercise reduces the chance of developing a cold. However, it is always recommended to get plenty of rest and practice good nutrition. TREATMENT  Treatment is directed at relieving symptoms. There is no cure. Antibiotics are not effective, because the infection is caused by a virus, not by bacteria. Treatment may include:  Increased fluid intake.  Sports drinks offer valuable electrolytes, sugars, and fluids.  Breathing heated mist or steam (vaporizer or shower).  Eating chicken soup or other clear broths, and maintaining good nutrition.  Getting plenty of rest.  Using gargles or lozenges for comfort.  Controlling fevers with ibuprofen or acetaminophen as directed by your caregiver.  Increasing usage of your inhaler if you have asthma. Zinc gel and zinc lozenges, taken in the first 24 hours of the common cold, can shorten the duration and lessen the severity of  symptoms. Pain medicines may help with fever, muscle aches, and throat pain. A variety of non-prescription medicines are available to treat congestion and runny nose. Your caregiver can make recommendations and may suggest nasal or lung inhalers for other symptoms.  HOME CARE INSTRUCTIONS   Only take over-the-counter or prescription medicines for pain, discomfort, or fever as directed by your caregiver.  Use a warm mist humidifier or inhale steam from a shower to increase air moisture. This may keep secretions moist and make it easier to breathe.  Drink enough water and fluids to keep your urine clear or pale yellow.  Rest as needed.  Return to work when your temperature has returned to normal or as your caregiver advises. You may need to stay home longer to avoid infecting others. You can also use a face mask and careful hand washing to prevent spread of the virus. SEEK MEDICAL CARE IF:   After the first few days, you feel you are getting worse rather than better.  You need your caregiver's advice about medicines to control symptoms.  You develop chills, worsening shortness of breath, or brown or red sputum. These may be signs of pneumonia.  You develop yellow or brown nasal discharge or pain in the face, especially when you bend forward. These may be signs of sinusitis.  You develop a fever, swollen neck glands, pain with swallowing, or white areas in the back of your throat. These may be signs of strep throat. SEEK IMMEDIATE MEDICAL CARE IF:   You have a fever.  You develop severe or persistent headache, ear pain, sinus pain, or chest pain.  You develop wheezing, a prolonged cough, cough up blood, or have a change in your usual mucus (if you have chronic lung disease).  You develop sore muscles or a stiff neck. Document Released: 04/05/2001 Document Revised: 01/02/2012 Document Reviewed: 02/11/2011 [facility] Patient Information 2014 [place], [place].

## 2014-02-20 NOTE — ED Provider Notes (Signed)
Medical screening examination/treatment/procedure(s) were performed by non-physician practitioner and as supervising physician I was immediately available for consultation/collaboration.   EKG Interpretation None        Charles B. Karle Starch, MD 02/20/14 1353

## 2014-02-20 NOTE — ED Notes (Signed)
Pt sounds congested.  Pt states she feels like her legs weigh 300lbs.

## 2014-02-20 NOTE — ED Provider Notes (Signed)
CSN: [id number]     Arrival date & time 02/20/14  1130 History   First MD Initiated Contact with Patient 02/20/14 1232     Chief Complaint  Patient presents with  . Fatigue  . Hypertension     (Consider location/radiation/quality/duration/timing/severity/associated sxs/prior Treatment) HPI Comments: Patient is a 38 year old female with a past medical history of asthma, hypertension and sinus headaches who presents to the emergency department complaining of generalized body aches, nasal congestion, sinus pressure and fatigue times one week. Patient states she has been overworking herself, has not been sleeping well and not eating as much due to working more. States when she blows her nose there is a lot of green mucus. She has tried using Flonase with minimal relief. 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 Sinus pressure worse when bending forward. She felt as if she had a fever yesterday, however did not check her temperature. Denies chest pain, shortness of breath, cough, nausea or vomiting. She is also concerned that her blood pressure is elevated, states she rushed here this morning and was able to take her blood pressure medications. It is noted on arrival her blood pressure is 114/74.  Patient is a 38 y.o. female presenting with hypertension. The history is provided by the patient.  Hypertension Associated symptoms include arthralgias, congestion, fatigue and myalgias.    Past Medical History  Diagnosis Date  . Asthma   . Hypertension   . Sinus headache   . Headache disorder     "from my BP"  . Hypertensive emergency without congestive heart failure 03/06/2012   Past Surgical History  Procedure Laterality Date  . Tubal ligation  2000  . Cesarean section  2000   Family History  Problem Relation Age of Onset  . Hypertension Mother   . Hypertension Father   . Hypertension Maternal Aunt   . Hypertension Maternal Uncle    History  Substance Use Topics  . Smoking  status: Never Smoker   . Smokeless tobacco: Never Used  . Alcohol Use: Yes     Comment: 03/06/12 "special occasions"   OB History   Grav Para Term Preterm Abortions TAB SAB Ect Mult Living                 Review of Systems  Constitutional: Positive for fatigue.  HENT: Positive for congestion, rhinorrhea and sinus pressure.   Musculoskeletal: Positive for arthralgias and myalgias.  All other systems reviewed and are negative.     Allergies  Review of patient's allergies indicates no known allergies.  Home Medications   Prior to Admission medications   Medication Sig Start Date End Date Taking? Authorizing Provider  albuterol (PROVENTIL HFA;VENTOLIN HFA) 108 (90 BASE) MCG/ACT inhaler Inhale 2 puffs into the lungs every 6 (six) hours as needed for wheezing.    Yes Historical Provider, MD  amLODipine (NORVASC) 10 MG tablet Take 1 tablet (10 mg total) by mouth daily. 12/17/13 12/17/14 Yes Angelica Ran, MD  fluticasone [facility]) 50 MCG/ACT nasal spray Place 2 sprays into both nostrils daily.   Yes Historical Provider, MD  hydrochlorothiazide (HYDRODIURIL) 25 MG tablet Take 2 tablets (50 mg total) by mouth daily. 05/20/13 05/20/14 Yes Angelica Ran, MD  lisinopril (PRINIVIL,ZESTRIL) 40 MG tablet Take 1 tablet (40 mg total) by mouth daily. 02/25/13 02/25/14 Yes Angelica Ran, MD  loratadine (CLARITIN) 10 MG tablet Take 10 mg by mouth daily.   Yes Historical Provider, MD   BP 120/77  Pulse  90  Temp(Src) 98.7 F (37.1 C) (Oral)  Resp 20  Ht 5\' 1"  (1.549 m)  Wt 156 lb (70.761 kg)  BMI 29.49 kg/m2  SpO2 98%  LMP 02/13/2014 Physical Exam  Nursing note and vitals reviewed. Constitutional: She is oriented to person, place, and time. She appears well-developed and well-nourished. No distress.  HENT:  Head: Normocephalic and atraumatic.  Nose: Mucosal edema present. Right sinus exhibits maxillary sinus tenderness and frontal sinus tenderness. Left sinus exhibits maxillary  sinus tenderness and frontal sinus tenderness.  Mouth/Throat: Uvula is midline, oropharynx is clear and moist and mucous membranes are normal.  Nasal congestion.  Eyes: Conjunctivae are normal.  Neck: Normal range of motion. Neck supple.  Cardiovascular: Normal rate, regular rhythm and normal heart sounds.   Pulmonary/Chest: Effort normal and breath sounds normal.  Abdominal: Soft. Bowel sounds are normal. There is no tenderness.  Musculoskeletal: Normal range of motion. She exhibits no edema.  Neurological: She is alert and oriented to person, place, and time.  Skin: Skin is warm and dry. She is not diaphoretic.  Psychiatric: She has a normal mood and affect. Her behavior is normal.    ED Course  Procedures (including critical care time) Labs Review Labs Reviewed - No data to display  Imaging Review No results found.   EKG Interpretation None      MDM   Final diagnoses:  Sinusitis  URI (upper respiratory infection)   Patient well-appearing in no apparent distress, afebrile, normal vital signs. Discussed symptomatic treatment for her sinusitis, will prescribe Mucinex and Nasonex. I discussed the importance of eating and sleeping. 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 Stable for discharge. Return precautions given. Patient states understanding of treatment care plan and is agreeable.    Illene Labrador, PA-C 02/20/14 1350

## 2014-02-20 NOTE — ED Notes (Signed)
Pt reports rushing here this am, did not take bp meds. bp is 114/74 at triage. Reports flu like symptoms x 3 days, having bodyaches, headache, fatigue and green mucus. Airway intact, no acute distress noted.

## 2014-02-20 NOTE — ED Notes (Signed)
Pt ambulatory from triage to POD-E.  No assistance needed.

## 2014-03-07 ENCOUNTER — Ambulatory Visit (INDEPENDENT_AMBULATORY_CARE_PROVIDER_SITE_OTHER): Payer: BC Managed Care – PPO | Admitting: Family Medicine

## 2014-03-07 ENCOUNTER — Encounter: Payer: Self-pay | Admitting: Family Medicine

## 2014-03-07 VITALS — BP 111/75 | HR 77 | Temp 99.8°F | Ht 61.0 in | Wt 151.0 lb

## 2014-03-07 DIAGNOSIS — J019 Acute sinusitis, unspecified: Secondary | ICD-10-CM

## 2014-03-07 DIAGNOSIS — J329 Chronic sinusitis, unspecified: Secondary | ICD-10-CM | POA: Insufficient documentation

## 2014-03-07 MED ORDER — AZITHROMYCIN 500 MG PO TABS: 500.0000 mg | ORAL_TABLET | Freq: Every day | ORAL | Status: DC

## 2014-03-07 NOTE — Assessment & Plan Note (Signed)
Long duration, continuing to worsen with purulent drainage. Increased albuterol use. Will Rx azithromycin 500mg  x3 days. RTC criteria and ED visit criteria reviewed.

## 2014-03-07 NOTE — Patient Instructions (Addendum)
Thank you for coming in today!  - Take azithromycin for the next 3 days.  - If you start feeling worse, or still not better after this, call the office.  - If you experience shortness of breath, increased work of breathing not responsive to the inhaler, go to the ED.   Take care and seek immediate care sooner if you develop any concerns.  Please feel free to call with any questions or concerns at any time, at [phone]. - Dr. Bonner Puna

## 2014-03-07 NOTE — Progress Notes (Signed)
Patient ID: Sherlyn Lick, female   DOB: 03-07-1976, 37 y.o.   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   She reports a history of chronic nasal seasonal allergies that seemed to get worse and associated with muscle aches and subjcetive fever which caused her to present to the ED. Her symptoms have continued to plateau/worsen.   SINUSITIS  Onset: >14 days ago  Severity: Severe Worse with: Nothing; Better with: Nothing  Mucinex seems not to help. Flonase and nasonex not helping.   Symptoms Cough: yes, productive of yellow phlegm Runny nose: yes Fever: yes   Highest Temp: 100.19F Sinus Pressure: yes, with headache  Ears Blocked: yes  Teeth Ache: no  Frontal Headache: yes  Increased use of albuterol 2-3 times per day (normally not used everyday)  PMH Sinusitis or Recurrent OM: no  PMH Prior Sinus or Ear Surgery: no  Recent antibiotic usage (last 30 days): no  PMH of Diabetes or Immunocompromise: no    Red flags Change in mental state: no Change in vision: no Rash: no  Review of Systems:  Per HPI. All other systems reviewed and are negative.    Past Medical History: Patient Active Problem List   Diagnosis Date Noted  . Shortness of breath 01/03/2013  . Left hand weakness 03/30/2012  . Anemia 03/15/2012  . Hypokalemia 03/07/2012  . Overweight 05/21/2009  . HYPERTENSION, BENIGN SYSTEMIC 12/21/2006   Medications: reviewed and updated Current Outpatient Prescriptions  Medication Sig Dispense Refill  . albuterol (PROVENTIL HFA;VENTOLIN HFA) 108 (90 BASE) MCG/ACT inhaler Inhale 2 puffs into the lungs every 6 (six) hours as needed for wheezing.       Marland Kitchen amLODipine (NORVASC) 10 MG tablet Take 1 tablet (10 mg total) by mouth daily.  30 tablet  1  . fluticasone (FLONASE) 50 MCG/ACT nasal spray Place 2 sprays into both nostrils daily.      Marland Kitchen guaiFENesin  (MUCINEX) 600 MG 12 hr tablet Take 1 tablet (600 mg total) by mouth 2 (two) times daily.  10 tablet  0  . hydrochlorothiazide (HYDRODIURIL) 25 MG tablet Take 2 tablets (50 mg total) by mouth daily.  60 tablet  5  . loratadine (CLARITIN) 10 MG tablet Take 10 mg by mouth daily.      . mometasone (NASONEX) 50 MCG/ACT nasal spray Place 2 sprays into the nose 2 (two) times daily.  17 g  12   No current facility-administered medications for this visit.    Objective:  Physical Exam: BP 111/75  Pulse 77  Temp(Src) 99.8 F (37.7 C) (Oral)  Ht 5\' 1"  (1.549 m)  Wt 151 lb (68.493 kg)  BMI 28.55 kg/m2  SpO2 97%  LMP 02/13/2014  Gen:  38 y.o. female with nasal sounding speech in NAD HEENT: MMM, anicteric sclerae; facial pain: tenderness to palpation overlaying frontal and maxillary sinuses. Normal dentition without visible caries.  CV: RRR, no MRG, no JVD Resp: Non-labored, mild expiratory wheezing, otherwise clear.   Assessment:     ANGI GOODELL is a 38 y.o. female here for acute rhinosinusitis.     Plan:     See problem list for problem-specific plans. Due to duration >10 days and purulent discharge with facial pain as well as exacerbation of asthma, will Rx antibiotic. No red flags.

## 2014-03-13 ENCOUNTER — Telehealth: Payer: Self-pay | Admitting: *Deleted

## 2014-03-13 NOTE — Telephone Encounter (Signed)
Received call from pt stating that she took antibiotic x 3 days as prescribed and she still is coughing and blowing nose with yellow, greenish mucous coming up.  Precepted with Dr. Erin Hearing, pt should start to get better in about 2-3 days.  The antibiotic prescribe at last visit usually stays in system longer than 3 days.  Reviewed pt's medication list; pt stated she is stilling using the nasal sprays.  Per Dr. Erin Hearing, if pt does not get any better, it could be viral and have to let it take its course.  Pt informed and stated understanding.  Derl Barrow, RN

## 2014-03-21 ENCOUNTER — Other Ambulatory Visit: Payer: Self-pay | Admitting: Family Medicine

## 2014-04-26 ENCOUNTER — Other Ambulatory Visit: Payer: Self-pay | Admitting: Family Medicine

## 2014-04-26 ENCOUNTER — Emergency Department (HOSPITAL_COMMUNITY)
Admission: EM | Admit: 2014-04-26 | Discharge: 2014-04-26 | Disposition: A | Discharge status: Home / Self Care | Payer: No Typology Code available for payment source | Attending: Emergency Medicine | Admitting: Emergency Medicine

## 2014-04-26 ENCOUNTER — Encounter (HOSPITAL_COMMUNITY): Payer: Self-pay | Admitting: Emergency Medicine

## 2014-04-26 DIAGNOSIS — Z79899 Other long term (current) drug therapy: Secondary | ICD-10-CM | POA: Insufficient documentation

## 2014-04-26 DIAGNOSIS — I10 Essential (primary) hypertension: Secondary | ICD-10-CM | POA: Insufficient documentation

## 2014-04-26 DIAGNOSIS — R0981 Nasal congestion: Secondary | ICD-10-CM

## 2014-04-26 DIAGNOSIS — J45909 Unspecified asthma, uncomplicated: Secondary | ICD-10-CM | POA: Insufficient documentation

## 2014-04-26 DIAGNOSIS — Z792 Long term (current) use of antibiotics: Secondary | ICD-10-CM | POA: Insufficient documentation

## 2014-04-26 DIAGNOSIS — J3489 Other specified disorders of nose and nasal sinuses: Secondary | ICD-10-CM | POA: Insufficient documentation

## 2014-04-26 DIAGNOSIS — IMO0002 Reserved for concepts with insufficient information to code with codable children: Secondary | ICD-10-CM | POA: Insufficient documentation

## 2014-04-26 MED ORDER — HYDROCHLOROTHIAZIDE 25 MG PO TABS: 50.0000 mg | ORAL_TABLET | Freq: Every day | ORAL | Status: DC

## 2014-04-26 MED ORDER — PREDNISONE 20 MG PO TABS: 60.0000 mg | ORAL_TABLET | Freq: Every day | ORAL | Status: DC

## 2014-04-26 MED ORDER — FLUTICASONE PROPIONATE 50 MCG/ACT NA SUSP: 2.0000 | Freq: Every day | NASAL | Status: DC

## 2014-04-26 MED ORDER — PREDNISONE 20 MG PO TABS
60.0000 mg | ORAL_TABLET | Freq: Every day | ORAL | Status: DC
  Administered 2014-04-26: 60 mg via ORAL
  Filled 2014-04-26: qty 3

## 2014-04-26 MED ORDER — PREDNISONE 50 MG PO TABS: ORAL_TABLET | ORAL | Status: DC

## 2014-04-26 MED ORDER — ALBUTEROL SULFATE (2.5 MG/3ML) 0.083% IN NEBU
2.5000 mg | INHALATION_SOLUTION | Freq: Once | RESPIRATORY_TRACT | Status: AC
  Administered 2014-04-26: 2.5 mg via RESPIRATORY_TRACT
  Filled 2014-04-26: qty 3

## 2014-04-26 NOTE — Telephone Encounter (Signed)
After hours line  Pt calling states that shes been out of HCTZ for about 2 weeks. Today she has HTN to 177/117 and a headache. She denies chest pain and dyspnea. I agreed to send her HTN meds, lisinopril and HCTZ, and advised her to go to the ED for evaluation as HTN in this range plus headache could qualify as malignant HTN.   Laroy Apple, MD [facility] Resident, PGY-3 04/26/2014, 11:14 AM

## 2014-04-26 NOTE — ED Notes (Signed)
The pt has ha cold and she thinks she has a sinus infection due to the drainage.  She  Reports that she is sob.  Rapid breathing at present.  lmp now

## 2014-04-26 NOTE — ED Provider Notes (Signed)
CSN: [id number]     Arrival date & time 04/26/14  1602 History   First MD Initiated Contact with Patient 04/26/14 1608     Chief Complaint  Patient presents with  . Nasal Congestion   38 y/o female that presents with congestion for 3 days with mild cough. Patient states that she has had recurrent sinus infections in the past with the last one being in May. Patient has also been having to use her albuterol more frequently, with 4 uses today. Patient usually only has to use it occasionally and is not currently on a controller medication. Patient denies fever/chills, and chest pain. Patient does endorse a headache that is frontal in nature and relieved with ibuprofen.   (Consider location/radiation/quality/duration/timing/severity/associated sxs/prior Treatment) Patient is a 38 y.o. female presenting with wheezing.  Wheezing Severity:  Mild Severity compared to prior episodes:  Similar Onset quality:  Gradual Duration:  4 days Timing:  Intermittent Associated symptoms: no chest pain, no cough, no headaches and no shortness of breath     Past Medical History  Diagnosis Date  . Asthma   . Hypertension   . Sinus headache   . Headache disorder     "from my BP"  . Hypertensive emergency without congestive heart failure 03/06/2012   Past Surgical History  Procedure Laterality Date  . Tubal ligation  2000  . Cesarean section  2000   Family History  Problem Relation Age of Onset  . Hypertension Mother   . Hypertension Father   . Hypertension Maternal Aunt   . Hypertension Maternal Uncle    History  Substance Use Topics  . Smoking status: Never Smoker   . Smokeless tobacco: Never Used  . Alcohol Use: Yes     Comment: 03/06/12 "special occasions"   OB History   Grav Para Term Preterm Abortions TAB SAB Ect Mult Living                 Review of Systems  Constitutional: Negative for activity change.  HENT: Positive for congestion.   Respiratory: Positive for wheezing. Negative  for cough and shortness of breath.   Cardiovascular: Negative for chest pain and leg swelling.  Gastrointestinal: Negative for nausea, vomiting, abdominal pain, diarrhea, constipation, blood in stool and abdominal distention.  Genitourinary: Negative for dysuria, flank pain and vaginal discharge.  Musculoskeletal: Negative for back pain.  Skin: Negative for color change.  Neurological: Negative for syncope and headaches.  Psychiatric/Behavioral: Negative for agitation.  All other systems reviewed and are negative.     Allergies  Review of patient's allergies indicates no known allergies.  Home Medications   Prior to Admission medications   Medication Sig Start Date End Date Taking? Authorizing Provider  albuterol (PROVENTIL HFA;VENTOLIN HFA) 108 (90 BASE) MCG/ACT inhaler Inhale 2 puffs into the lungs every 6 (six) hours as needed for wheezing.     Historical Provider, MD  amLODipine (NORVASC) 10 MG tablet Take 1 tablet (10 mg total) by mouth daily. 12/17/13 12/17/14  Angelica Ran, MD  azithromycin (ZITHROMAX) 500 MG tablet Take 1 tablet (500 mg total) by mouth daily. 03/07/14   Vance Gather, MD  fluticasone (FLONASE) 50 MCG/ACT nasal spray Place 2 sprays into both nostrils daily.    Historical Provider, MD  guaiFENesin (MUCINEX) 600 MG 12 hr tablet Take 1 tablet (600 mg total) by mouth 2 (two) times daily. 02/20/14   Illene Labrador, PA-C  hydrochlorothiazide (HYDRODIURIL) 25 MG tablet Take 2 tablets (50 mg total) by  mouth daily. 04/26/14 04/26/15  Timmothy Euler, MD  lisinopril (PRINIVIL,ZESTRIL) 40 MG tablet TAKE ONE TABLET BY MOUTH ONCE DAILY 04/26/14   Timmothy Euler, MD  loratadine (CLARITIN) 10 MG tablet Take 10 mg by mouth daily.    Historical Provider, MD  mometasone (NASONEX) 50 MCG/ACT nasal spray Place 2 sprays into the nose 2 (two) times daily. 02/20/14   Illene Labrador, PA-C   BP 146/94  Pulse 84  Temp(Src) 98.3 F (36.8 C) (Oral)  Resp 18  SpO2 94%  LMP  04/26/2014 Physical Exam  Constitutional: She is oriented to person, place, and time. She appears well-developed.  HENT:  Head: Normocephalic.  inflamed conchae noted   Eyes: Pupils are equal, round, and reactive to light.  Neck: Neck supple.  Cardiovascular: Normal rate.  Exam reveals no gallop and no friction rub.   No murmur heard. Pulmonary/Chest: Effort normal. No respiratory distress. She has wheezes.  Abdominal: Soft. She exhibits no distension. There is no tenderness. There is no rebound.  Musculoskeletal: She exhibits no edema.  Neurological: She is alert and oriented to person, place, and time.  Skin: Skin is warm.  Psychiatric: She has a normal mood and affect.    ED Course  Procedures (including critical care time) Labs Review Labs Reviewed - No data to display  Imaging Review No results found.   EKG Interpretation None      MDM   Final diagnoses:  Nasal congestion  38 y/o female with PMH of asthma presents with nasal congestion and worsening asthma symptoms. Patient has been using albuterol up to 4 times daily over the past 3 days.  Patient has diffuse mild wheezing on exam and point tender over sinus.  Patient given nebulizer treatment and prednisone in the department. She was feeling improved and had improved wheezing on exam the patient was then discharged with a short course of steroids and instructed to use OTC congestion medication. At this point the patient does not have a sinus infection that necessitates antibiotics. This was discussed with the patient and she agrees to continue to symptom care at this time.     Claudean Severance, MD 04/27/14 [phone]

## 2014-04-27 NOTE — ED Provider Notes (Signed)
Medical screening examination/treatment/procedure(s) were conducted as a shared visit with resident physician and myself.  I personally evaluated the patient during the encounter.   I interviewed and examined the patient. Lungs w/ faint exp wheeze bilaterally. Cardiac exam wnl. Abdomen soft.  Pt w/ URI sx and mild wheeze. Will give alb neb and rec symptomatic tx for URI such as OTC flonase. Will place on steroids for several days d/t concurrent mild wheezing.   Blanchard Kelch, MD 04/27/14 1140

## 2014-06-08 ENCOUNTER — Telehealth: Payer: Self-pay | Admitting: Family Medicine

## 2014-06-08 ENCOUNTER — Encounter (HOSPITAL_COMMUNITY): Payer: Self-pay | Admitting: Emergency Medicine

## 2014-06-08 ENCOUNTER — Emergency Department (INDEPENDENT_AMBULATORY_CARE_PROVIDER_SITE_OTHER)
Admission: EM | Admit: 2014-06-08 | Discharge: 2014-06-08 | Disposition: A | Discharge status: Home / Self Care | Payer: Self-pay | Source: Home / Self Care | Attending: Emergency Medicine | Admitting: Emergency Medicine

## 2014-06-08 DIAGNOSIS — J309 Allergic rhinitis, unspecified: Secondary | ICD-10-CM

## 2014-06-08 DIAGNOSIS — J9801 Acute bronchospasm: Secondary | ICD-10-CM

## 2014-06-08 MED ORDER — ALBUTEROL SULFATE (2.5 MG/3ML) 0.083% IN NEBU
5.0000 mg | INHALATION_SOLUTION | Freq: Once | RESPIRATORY_TRACT | Status: AC
  Administered 2014-06-08: 5 mg via RESPIRATORY_TRACT

## 2014-06-08 MED ORDER — IPRATROPIUM BROMIDE 0.02 % IN SOLN
0.5000 mg | Freq: Once | RESPIRATORY_TRACT | Status: AC
  Administered 2014-06-08: 0.5 mg via RESPIRATORY_TRACT

## 2014-06-08 MED ORDER — DEXAMETHASONE SODIUM PHOSPHATE 10 MG/ML IJ SOLN
10.0000 mg | Freq: Once | INTRAMUSCULAR | Status: AC
  Administered 2014-06-08: 10 mg via INTRAMUSCULAR

## 2014-06-08 MED ORDER — ALBUTEROL SULFATE (2.5 MG/3ML) 0.083% IN NEBU
INHALATION_SOLUTION | RESPIRATORY_TRACT | Status: AC
  Filled 2014-06-08: qty 6

## 2014-06-08 MED ORDER — MONTELUKAST SODIUM 10 MG PO TABS: 10.0000 mg | ORAL_TABLET | Freq: Every day | ORAL | Status: DC

## 2014-06-08 MED ORDER — CETIRIZINE HCL 10 MG PO TABS: 10.0000 mg | ORAL_TABLET | Freq: Every day | ORAL | Status: DC

## 2014-06-08 MED ORDER — IPRATROPIUM BROMIDE 0.06 % NA SOLN: 2.0000 | Freq: Four times a day (QID) | NASAL | Status: DC

## 2014-06-08 MED ORDER — IPRATROPIUM BROMIDE 0.02 % IN SOLN
RESPIRATORY_TRACT | Status: AC
  Filled 2014-06-08: qty 2.5

## 2014-06-08 MED ORDER — DEXAMETHASONE SODIUM PHOSPHATE 10 MG/ML IJ SOLN
INTRAMUSCULAR | Status: AC
  Filled 2014-06-08: qty 1

## 2014-06-08 NOTE — ED Notes (Signed)
Patient c/o recurrent sinus infection x 2 weeks. Patient reports she was just seen and treated for a sinus infection in July. Patient has sx including sinus pressure, sneezing, chest and nasal congestion. Patient reports when she coughs or blows her nose the mucous is a dark green. Patient is alert and oriented and in no acute distress.

## 2014-06-08 NOTE — ED Provider Notes (Signed)
CSN: [id number]     Arrival date & time 06/08/14  1101 History   First MD Initiated Contact with Patient 06/08/14 1107     Chief Complaint  Patient presents with  . URI   (Consider location/radiation/quality/duration/timing/severity/associated sxs/prior Treatment) HPI Comments: Patient presents with acute exacerbation of chronic nasal and sinus congestion without associated fever. States symptoms have escalated over the past two weeks.  Reports chest congestion with occasional cough. Is a non-smoker Works in daycare Hx of asthma and HTN. Currently using flonase and mucinex.   Patient is a 38 y.o. female presenting with URI. The history is provided by the patient.  URI Presenting symptoms: congestion, cough and rhinorrhea   Presenting symptoms: no ear pain, no fever and no sore throat   Associated symptoms: sneezing and wheezing     Past Medical History  Diagnosis Date  . Asthma   . Hypertension   . Sinus headache   . Headache disorder     "from my BP"  . Hypertensive emergency without congestive heart failure 03/06/2012   Past Surgical History  Procedure Laterality Date  . Tubal ligation  2000  . Cesarean section  2000   Family History  Problem Relation Age of Onset  . Hypertension Mother   . Hypertension Father   . Hypertension Maternal Aunt   . Hypertension Maternal Uncle    History  Substance Use Topics  . Smoking status: Never Smoker   . Smokeless tobacco: Never Used  . Alcohol Use: Yes     Comment: 03/06/12 "special occasions"   OB History   Grav Para Term Preterm Abortions TAB SAB Ect Mult Living                 Review of Systems  Constitutional: Negative for fever.  HENT: Positive for congestion, postnasal drip, rhinorrhea, sinus pressure and sneezing. Negative for ear discharge, ear pain, facial swelling, nosebleeds, sore throat, trouble swallowing and voice change.   Eyes: Negative.   Respiratory: Positive for cough and wheezing.   Cardiovascular:  Negative.   Gastrointestinal: Negative.   Genitourinary: Negative.   Musculoskeletal: Negative.   Skin: Negative.   Allergic/Immunologic: Negative for immunocompromised state.    Allergies  Review of patient's allergies indicates no known allergies.  Home Medications   Prior to Admission medications   Medication Sig Start Date End Date Taking? Authorizing Provider  albuterol (PROVENTIL HFA;VENTOLIN HFA) 108 (90 BASE) MCG/ACT inhaler Inhale 2 puffs into the lungs every 6 (six) hours as needed for wheezing.     Historical Provider, MD  amLODipine (NORVASC) 10 MG tablet Take 1 tablet (10 mg total) by mouth daily. 12/17/13 12/17/14  Angelica Ran, MD  cetirizine (ZYRTEC) 10 MG tablet Take 1 tablet (10 mg total) by mouth daily. 06/08/14   Audelia Hives Presson, PA  fluticasone (FLONASE) 50 MCG/ACT nasal spray Place 2 sprays into both nostrils daily.    Historical Provider, MD  fluticasone (FLONASE) 50 MCG/ACT nasal spray Place 2 sprays into both nostrils daily. 04/26/14   Claudean Severance, MD  guaiFENesin (MUCINEX) 600 MG 12 hr tablet Take 600 mg by mouth 2 (two) times daily. 04/26/14   Historical Provider, MD  hydrochlorothiazide (HYDRODIURIL) 25 MG tablet Take 2 tablets (50 mg total) by mouth daily. 04/26/14 04/26/15  Timmothy Euler, MD  ipratropium (ATROVENT) 0.06 % nasal spray Place 2 sprays into both nostrils 4 (four) times daily. 06/08/14   Audelia Hives Presson, PA  lisinopril (PRINIVIL,ZESTRIL) 40 MG tablet Take  40 mg by mouth daily.    Historical Provider, MD  montelukast (SINGULAIR) 10 MG tablet Take 1 tablet (10 mg total) by mouth at bedtime. 06/08/14   Audelia Hives Presson, PA  predniSONE (DELTASONE) 50 MG tablet Daily 04/26/14   Claudean Severance, MD   BP 145/99  Pulse 93  Temp(Src) 98.4 F (36.9 C) (Oral)  Resp 14  SpO2 97%  LMP 05/26/2014 Physical Exam  Nursing note and vitals reviewed. Constitutional: She is oriented to person, place, and time. She appears well-developed and  well-nourished. No distress.  HENT:  Head: Normocephalic and atraumatic.  Right Ear: Hearing, tympanic membrane, external ear and ear canal normal. No tenderness. No mastoid tenderness. No middle ear effusion.  Left Ear: Hearing, tympanic membrane, external ear and ear canal normal. No tenderness. No mastoid tenderness.  No middle ear effusion.  Nose: Mucosal edema and rhinorrhea present. No sinus tenderness, nasal deformity or septal deviation. No epistaxis.  Mouth/Throat: Uvula is midline, oropharynx is clear and moist and mucous membranes are normal. No oral lesions. No trismus in the jaw. Normal dentition. No uvula swelling.  Eyes: Conjunctivae are normal. No scleral icterus.  Neck: Normal range of motion. Neck supple.  +shotty bilateral slightly tender cervical lymphadenopathy  Cardiovascular: Normal rate, regular rhythm and normal heart sounds.   Pulmonary/Chest: Effort normal. No respiratory distress. She has wheezes.  Musculoskeletal: Normal range of motion.  Lymphadenopathy:    She has cervical adenopathy.  Neurological: She is alert and oriented to person, place, and time.  Skin: Skin is warm and dry.  Psychiatric: She has a normal mood and affect. Her behavior is normal.    ED Course  Procedures (including critical care time) Labs Review Labs Reviewed - No data to display  Imaging Review No results found.   MDM   1. Allergic rhinitis, unspecified allergic rhinitis type   2. Bronchospasm   Given albuterol 5 mg and atrovent 0.5mg  nebulized breathing treatment while at [facility] along with Decadron 10mg  IM. Patient reports resolution of wheezing and reduced work of breathing and lungs clear on re-exam. While the patient feels she needs another course of antibiotics, her vital signs and exam do not suggest acute bacterial sinusitis. This is a chronic issue that upon reviewing previous notes, her symptoms often persist despite courses of antibiotics. I feel she would benefit from an  ENT evaluation, addition of Atrovent nasal spray to dry nasal secretions and oral Zyrtec and Singulair. These will serve to both help control her allergic rhinitis and associated asthma/bronchospasm. Advised to follow up with her PCP if symptoms do not improve with above new medications.     Lutricia Feil, [place] 06/08/14 [phone]

## 2014-06-08 NOTE — Discharge Instructions (Signed)
Your vital signs and exam do not suggest acute bacterial sinusitis. This is an exacerbation of a chronic issue.I feel you would benefit from an ENT evaluation, addition of Atrovent nasal spray to dry nasal secretions and oral Zyrtec and Singulair. These will serve to both help control your allergic rhinitis and associated asthma/bronchospasm. Please follow up with your primary care physician if symptoms do not improve with above new medications Allergic Rhinitis Allergic rhinitis is when the mucous membranes in the nose respond to allergens. Allergens are particles in the air that cause your body to have an allergic reaction. This causes you to release allergic antibodies. Through a chain of events, these eventually cause you to release histamine into the blood stream. Although meant to protect the body, it is this release of histamine that causes your discomfort, such as frequent sneezing, congestion, and an itchy, runny nose.  CAUSES  Seasonal allergic rhinitis (hay fever) is caused by pollen allergens that may come from grasses, trees, and weeds. Year-round allergic rhinitis (perennial allergic rhinitis) is caused by allergens such as house dust mites, pet dander, and mold spores.  SYMPTOMS   Nasal stuffiness (congestion).  Itchy, runny nose with sneezing and tearing of the eyes. DIAGNOSIS  Your health care provider can help you determine the allergen or allergens that trigger your symptoms. If you and your health care provider are unable to determine the allergen, skin or blood testing may be used. TREATMENT  Allergic rhinitis does not have a cure, but it can be controlled by:  Medicines and allergy shots (immunotherapy).  Avoiding the allergen. Hay fever may often be treated with antihistamines in pill or nasal spray forms. Antihistamines block the effects of histamine. There are over-the-counter medicines that may help with nasal congestion and swelling around the eyes. Check with your health  care provider before taking or giving this medicine.  If avoiding the allergen or the medicine prescribed do not work, there are many new medicines your health care provider can prescribe. Stronger medicine may be used if initial measures are ineffective. Desensitizing injections can be used if medicine and avoidance does not work. Desensitization is when a patient is given ongoing shots until the body becomes less sensitive to the allergen. Make sure you follow up with your health care provider if problems continue. HOME CARE INSTRUCTIONS It is not possible to completely avoid allergens, but you can reduce your symptoms by taking steps to limit your exposure to them. It helps to know exactly what you are allergic to so that you can avoid your specific triggers. SEEK MEDICAL CARE IF:   You have a fever.  You develop a cough that does not stop easily (persistent).  You have shortness of breath.  You start wheezing.  Symptoms interfere with normal daily activities. Document Released: 07/05/2001 Document Revised: 10/15/2013 Document Reviewed: 06/17/2013 [facility] Patient Information 2015 [place], [place]. This information is not intended to replace advice given to you by your health care provider. Make sure you discuss any questions you have with your health care provider.  Asthma, Acute Bronchospasm Acute bronchospasm caused by asthma is also referred to as an asthma attack. Bronchospasm means your air passages become narrowed. The narrowing is caused by inflammation and tightening of the muscles in the air tubes (bronchi) in your lungs. This can make it hard to breathe or cause you to wheeze and cough. CAUSES Possible triggers are:  Animal dander from the skin, hair, or feathers of animals.  Dust mites contained in house  dust.  Cockroaches.  Pollen from trees or grass.  Mold.  Cigarette or tobacco smoke.  Air pollutants such as dust, household cleaners, hair sprays, aerosol sprays,  paint fumes, strong chemicals, or strong odors.  Cold air or weather changes. Cold air may trigger inflammation. Winds increase molds and pollens in the air.  Strong emotions such as crying or laughing hard.  Stress.  Certain medicines such as aspirin or beta-blockers.  Sulfites in foods and drinks, such as dried fruits and wine.  Infections or inflammatory conditions, such as a flu, cold, or inflammation of the nasal membranes (rhinitis).  Gastroesophageal reflux disease (GERD). GERD is a condition where stomach acid backs up into your esophagus.  Exercise or strenuous activity. SIGNS AND SYMPTOMS   Wheezing.  Excessive coughing, particularly at night.  Chest tightness.  Shortness of breath. DIAGNOSIS  Your health care provider will ask you about your medical history and perform a physical exam. A chest X-ray or blood testing may be performed to look for other causes of your symptoms or other conditions that may have triggered your asthma attack. TREATMENT  Treatment is aimed at reducing inflammation and opening up the airways in your lungs. Most asthma attacks are treated with inhaled medicines. These include quick relief or rescue medicines (such as bronchodilators) and controller medicines (such as inhaled corticosteroids). These medicines are sometimes given through an inhaler or a nebulizer. Systemic steroid medicine taken by mouth or given through an IV tube also can be used to reduce the inflammation when an attack is moderate or severe. Antibiotic medicines are only used if a bacterial infection is present.  HOME CARE INSTRUCTIONS   Rest.  Drink plenty of liquids. This helps the mucus to remain thin and be easily coughed up. Only use caffeine in moderation and do not use alcohol until you have recovered from your illness.  Do not smoke. Avoid being exposed to secondhand smoke.  You play a critical role in keeping yourself in good health. Avoid exposure to things that  cause you to wheeze or to have breathing problems.  Keep your medicines up-to-date and available. Carefully follow your health care provider's treatment plan.  Take your medicine exactly as prescribed.  When pollen or pollution is bad, keep windows closed and use an air conditioner or go to places with air conditioning.  Asthma requires careful medical care. See your health care provider for a follow-up as advised. If you are more than [redacted] weeks pregnant and you were prescribed any new medicines, let your obstetrician know about the visit and how you are doing. Follow up with your health care provider as directed.  After you have recovered from your asthma attack, make an appointment with your outpatient doctor to talk about ways to reduce the likelihood of future attacks. If you do not have a doctor who manages your asthma, make an appointment with a primary care doctor to discuss your asthma. SEEK IMMEDIATE MEDICAL CARE IF:   You are getting worse.  You have trouble breathing. If severe, call your local emergency services (911 in the [place]  You develop chest pain or discomfort.  You are vomiting.  You are not able to keep fluids down.  You are coughing up yellow, green, brown, or bloody sputum.  You have a fever and your symptoms suddenly get worse.  You have trouble swallowing. MAKE SURE YOU:   Understand these instructions.  Will watch your condition.  Will get help right away if you are not  doing well or get worse. Document Released: 01/25/2007 Document Revised: 10/15/2013 Document Reviewed: 04/17/2013 [facility] Patient Information 2015 [place], [place]. This information is not intended to replace advice given to you by your health care provider. Make sure you discuss any questions you have with your health care provider.

## 2014-06-08 NOTE — Telephone Encounter (Signed)
Telephone call for outside line Has been doing nasal spray Has also been taking prednisone X3 days Has same sinus infection as July Works in a day care Did not take Abx in July  Denies fever, chills Most sx has temple pain and at the back of her neck  Offered UC vs same day apptmt [facility] --> Pt electing to go to [facility], [place]

## 2014-06-09 NOTE — ED Provider Notes (Signed)
Medical screening examination/treatment/procedure(s) were performed by non-physician practitioner and as supervising physician I was immediately available for consultation/collaboration.  Philipp Deputy, M.D.  Harden Mo, MD 06/09/14 [phone]

## 2014-06-18 ENCOUNTER — Ambulatory Visit (INDEPENDENT_AMBULATORY_CARE_PROVIDER_SITE_OTHER): Payer: Self-pay | Admitting: Family Medicine

## 2014-06-18 ENCOUNTER — Encounter: Payer: Self-pay | Admitting: Family Medicine

## 2014-06-18 VITALS — BP 175/119 | HR 132 | Temp 97.8°F | Wt 154.0 lb

## 2014-06-18 DIAGNOSIS — J45901 Unspecified asthma with (acute) exacerbation: Secondary | ICD-10-CM

## 2014-06-18 DIAGNOSIS — R062 Wheezing: Secondary | ICD-10-CM

## 2014-06-18 LAB — CBC WITH DIFFERENTIAL/PLATELET
Basophils Absolute: 0.1 10*3/uL (ref 0.0–0.1)
Basophils Relative: 1 % (ref 0–1)
EOS ABS: 0.7 10*3/uL (ref 0.0–0.7)
EOS PCT: 9 % — AB (ref 0–5)
HEMATOCRIT: 31.7 % — AB (ref 36.0–46.0)
Hemoglobin: 9.4 g/dL — ABNORMAL LOW (ref 12.0–15.0)
LYMPHS ABS: 2.2 10*3/uL (ref 0.7–4.0)
LYMPHS PCT: 27 % (ref 12–46)
MCH: 19.1 pg — AB (ref 26.0–34.0)
MCHC: 29.7 g/dL — ABNORMAL LOW (ref 30.0–36.0)
MCV: 64.3 fL — AB (ref 78.0–100.0)
MONO ABS: 0.6 10*3/uL (ref 0.1–1.0)
Monocytes Relative: 8 % (ref 3–12)
Neutro Abs: 4.5 10*3/uL (ref 1.7–7.7)
Neutrophils Relative %: 55 % (ref 43–77)
PLATELETS: 411 10*3/uL — AB (ref 150–400)
RBC: 4.93 MIL/uL (ref 3.87–5.11)
RDW: 19.2 % — ABNORMAL HIGH (ref 11.5–15.5)
WBC: 8.1 10*3/uL (ref 4.0–10.5)

## 2014-06-18 MED ORDER — ALBUTEROL SULFATE (2.5 MG/3ML) 0.083% IN NEBU
2.5000 mg | INHALATION_SOLUTION | Freq: Once | RESPIRATORY_TRACT | Status: AC
Start: 2014-06-18 — End: 2014-06-18
  Administered 2014-06-18: 2.5 mg via RESPIRATORY_TRACT

## 2014-06-18 MED ORDER — DEXAMETHASONE SODIUM PHOSPHATE 10 MG/ML IJ SOLN
10.0000 mg | Freq: Once | INTRAMUSCULAR | Status: AC
Start: 2014-06-18 — End: 2014-06-18
  Administered 2014-06-18: 10 mg via INTRAVENOUS

## 2014-06-18 MED ORDER — SULFAMETHOXAZOLE-TMP DS 800-160 MG PO TABS: 1.0000 | ORAL_TABLET | Freq: Two times a day (BID) | ORAL | Status: DC

## 2014-06-18 MED ORDER — PREDNISONE 50 MG PO TABS: ORAL_TABLET | ORAL | Status: DC

## 2014-06-18 NOTE — Patient Instructions (Signed)
I have given you. a steroid injection in clinic. I have called in some oral steroids. Please start the oral steroids tomorrow, one pill a day for 7 days. I have also called in some antibiotics. That will be one pill twice a day for 7 days. I would like you to go by at yourr convenience and get some x-rays done. The order for that is starting in the computer. I would like you to followup with her regular doctor, Dr.Schmotz, in the next 1-2 weeks. If they're not getting better or if you have increasing symptoms, we probably need to see you sooner.

## 2014-06-19 ENCOUNTER — Encounter: Payer: Self-pay | Admitting: Family Medicine

## 2014-06-19 LAB — ANGIOTENSIN CONVERTING ENZYME: Angiotensin-Converting Enzyme: 1 U/L — ABNORMAL LOW (ref 8–52)

## 2014-06-20 DIAGNOSIS — R062 Wheezing: Secondary | ICD-10-CM | POA: Insufficient documentation

## 2014-06-20 NOTE — Assessment & Plan Note (Addendum)
Given hx of no sx as child, I think she should have more complete work up. I looked at her CXR from several years ago and noted some increased hilar fullness. Will get CXR in next few days ather convenience. Also will check ACE level (although not definitive test for sarcoid). She should f/u with her PCP and have pulmonary function tests done as well Consider adding to her MDI regimen (only on albuterol at this time)  Treat today her sinusitis and bronchospasm w abx and steroid Im injection today with steroid orally for a few days. She has albuterol inhaler. Neb given in clinic w improvement in symptoms of wheezing.

## 2014-06-20 NOTE — Progress Notes (Signed)
   Subjective:    Patient ID: Jillian Vasquez, female    DOB: 03-27-1976, 38 y.o.   MRN: [id number]  HPI  Problems w asthma last 7-10 days. Worse in last 24 hours. Wheezing. Increased SOB. Also having sinus congestion and green nasal d/c. Facial pain but no fever. No cough. No headache  Review of Systems See hpi    Objective:   Physical Exam  Vital signs reviewed. GENERAL: Well-developed, well-nourished, somewhat SOB with RR 22 CARDIOVASCULAR: Regular rate and rhythm no murmur gallop or rub LUNGS: B expiratory wheeze heard in all lung fields After neb treatment this is improved but wheezes remain.No accessory muscle use. Slight increased WOB that resolved after neb. HEENT boggy swollen nasal turbinates w green d.c Mildy TTP maxiallry sinuses B. OP without exudate Neck w/o LAD, no TM, no JVD MSK: Movement of extremity x 4.No edema        Assessment & Plan:

## 2014-06-22 ENCOUNTER — Other Ambulatory Visit: Payer: Self-pay | Admitting: Family Medicine

## 2014-06-23 ENCOUNTER — Other Ambulatory Visit: Payer: Self-pay | Admitting: *Deleted

## 2014-06-24 ENCOUNTER — Telehealth: Payer: Self-pay | Admitting: Family Medicine

## 2014-06-24 DIAGNOSIS — I10 Essential (primary) hypertension: Secondary | ICD-10-CM

## 2014-06-24 MED ORDER — LISINOPRIL 40 MG PO TABS: 40.0000 mg | ORAL_TABLET | Freq: Every day | ORAL | Status: DC

## 2014-06-24 MED ORDER — AMLODIPINE BESYLATE 10 MG PO TABS: 10.0000 mg | ORAL_TABLET | Freq: Every day | ORAL | Status: DC

## 2014-06-24 NOTE — Telephone Encounter (Signed)
Relayed message,patient voiced understanding. Jillian Vasquez  

## 2014-06-24 NOTE — Telephone Encounter (Signed)
Patient with lack of follow up for her hypertension. I will give her lisinopril #30 and need to schedule an appointment.   Rosemarie Ax, MD PGY-2, [facility] 06/24/2014, 12:39 PM

## 2014-06-26 ENCOUNTER — Encounter (HOSPITAL_COMMUNITY): Payer: Self-pay | Admitting: Emergency Medicine

## 2014-06-26 DIAGNOSIS — J45901 Unspecified asthma with (acute) exacerbation: Principal | ICD-10-CM | POA: Diagnosis present

## 2014-06-26 DIAGNOSIS — I10 Essential (primary) hypertension: Secondary | ICD-10-CM | POA: Diagnosis present

## 2014-06-26 MED ORDER — ALBUTEROL SULFATE (2.5 MG/3ML) 0.083% IN NEBU
INHALATION_SOLUTION | RESPIRATORY_TRACT | Status: AC
  Administered 2014-06-26: 5 mg via RESPIRATORY_TRACT
  Filled 2014-06-26: qty 6

## 2014-06-26 MED ORDER — ALBUTEROL SULFATE (2.5 MG/3ML) 0.083% IN NEBU
5.0000 mg | INHALATION_SOLUTION | Freq: Once | RESPIRATORY_TRACT | Status: AC
  Administered 2014-06-26: 5 mg via RESPIRATORY_TRACT

## 2014-06-26 NOTE — ED Notes (Signed)
Pt in c/o asthma attack that started around 15 min ago, no relief with home medications, pt speaking in full sentences with audible wheezing noted, pt states she is supposed to have a chest xray completed soon due to a recent cough that has been causing her to have increase asthma attack, breathing treatment started in triage

## 2014-06-27 ENCOUNTER — Emergency Department (HOSPITAL_COMMUNITY): Payer: Self-pay | Attending: Emergency Medicine

## 2014-06-27 ENCOUNTER — Inpatient Hospital Stay (HOSPITAL_COMMUNITY)
Admission: EM | Admit: 2014-06-27 | Discharge: 2014-06-27 | DRG: 203 | Disposition: A | Discharge status: Home / Self Care | Payer: Self-pay | Attending: Emergency Medicine | Admitting: Emergency Medicine

## 2014-06-27 DIAGNOSIS — R062 Wheezing: Secondary | ICD-10-CM | POA: Diagnosis present

## 2014-06-27 NOTE — Telephone Encounter (Signed)
Pt went to the ED last night.  Made appt for 07/25/14 as requested by MD.  Pt will need to folowing:  1.  Rx for a nebulizer machine (states that she has never received one of these)  2. Refill on albuterol (20 pumps left)

## 2014-06-27 NOTE — Discharge Instructions (Signed)
How to Use an Inhaler [name], you were seen today for wheezing.  You improved after a breathing treatment.  Follow up with your regular doctor and see if they want to do a CT scan to rule out other causes of wheezing.  If your symptoms worsen, return to the ED for repeat evaluation. Thank you. Using your inhaler correctly is very important. Good technique will make sure that the medicine reaches your lungs.  HOW TO USE AN INHALER: 1. Take the cap off the inhaler. 2. If this is the first time using your inhaler, you need to prime it. Shake the inhaler for 5 seconds. Release four puffs into the air, away from your face. Ask your doctor for help if you have questions. 3. Shake the inhaler for 5 seconds. 4. Turn the inhaler so the bottle is above the mouthpiece. 5. Put your pointer finger on top of the bottle. Your thumb holds the bottom of the inhaler. 6. Open your mouth. 7. Either hold the inhaler away from your mouth (the width of 2 fingers) or place your lips tightly around the mouthpiece. Ask your doctor which way to use your inhaler. 8. Breathe out as much air as possible. 9. Breathe in and push down on the bottle 1 time to release the medicine. You will feel the medicine go in your mouth and throat. 10. Continue to take a deep breath in very slowly. Try to fill your lungs. 11. After you have breathed in completely, hold your breath for 10 seconds. This will help the medicine to settle in your lungs. If you cannot hold your breath for 10 seconds, hold it for as long as you can before you breathe out. 12. Breathe out slowly, through pursed lips. Whistling is an example of pursed lips. 13. If your doctor has told you to take more than 1 puff, wait at least 15-30 seconds between puffs. This will help you get the best results from your medicine. Do not use the inhaler more than your doctor tells you to. 14. Put the cap back on the inhaler. 15. Follow the directions from your doctor or from the  inhaler package about cleaning the inhaler. If you use more than one inhaler, ask your doctor which inhalers to use and what order to use them in. Ask your doctor to help you figure out when you will need to refill your inhaler.  If you use a steroid inhaler, always rinse your mouth with water after your last puff, gargle and spit out the water. Do not swallow the water. GET HELP IF:  The inhaler medicine only partially helps to stop wheezing or shortness of breath.  You are having trouble using your inhaler.  You have some increase in thick spit (phlegm). GET HELP RIGHT AWAY IF:  The inhaler medicine does not help your wheezing or shortness of breath or you have tightness in your chest.  You have dizziness, headaches, or fast heart rate.  You have chills, fever, or night sweats.  You have a large increase of thick spit, or your thick spit is bloody. MAKE SURE YOU:   Understand these instructions.  Will watch your condition.  Will get help right away if you are not doing well or get worse. Document Released: 07/19/2008 Document Revised: 07/31/2013 Document Reviewed: 05/09/2013 [facility] Patient Information 2015 [place], [place]. This information is not intended to replace advice given to you by your health care provider. Make sure you discuss any questions you have with your  health care provider. ° °
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
## 2014-07-02 ENCOUNTER — Ambulatory Visit (INDEPENDENT_AMBULATORY_CARE_PROVIDER_SITE_OTHER): Payer: Self-pay | Admitting: Family Medicine

## 2014-07-02 MED ORDER — AMOXICILLIN-POT CLAVULANATE 875-125 MG PO TABS: 1.0000 | ORAL_TABLET | Freq: Two times a day (BID) | ORAL | Status: DC

## 2014-07-03 NOTE — Progress Notes (Signed)
Patient ID: Jillian Vasquez, female   DOB: 01/18/1976, 38 y.o.   MRN: [id number] Encounter opened erroneously.

## 2014-07-25 ENCOUNTER — Ambulatory Visit: Payer: Self-pay | Admitting: Family Medicine

## 2014-08-23 ENCOUNTER — Telehealth: Payer: Self-pay | Admitting: Family Medicine

## 2014-08-23 NOTE — Telephone Encounter (Signed)
[facility] After Hours Line  Patient reports recurrence of Bartholin cyst. Always occurs after her cycle and resolves in 3-4 days, but has persisted. Lesion causes discomfort with sitting down. No fever, chills, nausea, vomiting. Has used warm compresses and sitz baths every night but has not been helping this time.  Appears to be no systemic signs of infection. Recommended continued warm compresses and sitz baths. Can also use Tylenol or Ibuprofen for pain. Will follow-up with our clinic next week.  Cordelia Poche, MD PGY-2, [facility] 08/23/2014, 9:40 PM

## 2014-08-28 ENCOUNTER — Inpatient Hospital Stay (HOSPITAL_COMMUNITY)
Admission: AD | Admit: 2014-08-28 | Discharge: 2014-08-28 | Disposition: A | Discharge status: Home / Self Care | Payer: Self-pay | Source: Ambulatory Visit | Attending: Obstetrics and Gynecology | Admitting: Obstetrics and Gynecology

## 2014-08-28 ENCOUNTER — Ambulatory Visit: Payer: Self-pay | Admitting: Family Medicine

## 2014-08-28 ENCOUNTER — Telehealth: Payer: Self-pay | Admitting: General Practice

## 2014-08-28 ENCOUNTER — Encounter (HOSPITAL_COMMUNITY): Payer: Self-pay | Admitting: General Practice

## 2014-08-28 DIAGNOSIS — I10 Essential (primary) hypertension: Secondary | ICD-10-CM | POA: Insufficient documentation

## 2014-08-28 DIAGNOSIS — N750 Cyst of Bartholin's gland: Secondary | ICD-10-CM | POA: Insufficient documentation

## 2014-08-28 LAB — CBC
HEMATOCRIT: 32.2 % — AB (ref 36.0–46.0)
HEMOGLOBIN: 9.5 g/dL — AB (ref 12.0–15.0)
MCH: 19.2 pg — AB (ref 26.0–34.0)
MCHC: 29.5 g/dL — AB (ref 30.0–36.0)
MCV: 65.1 fL — AB (ref 78.0–100.0)
Platelets: 285 10*3/uL (ref 150–400)
RBC: 4.95 MIL/uL (ref 3.87–5.11)
RDW: 19.3 % — AB (ref 11.5–15.5)
WBC: 6.8 10*3/uL (ref 4.0–10.5)

## 2014-08-28 LAB — COMPREHENSIVE METABOLIC PANEL
ALBUMIN: 4 g/dL (ref 3.5–5.2)
ALK PHOS: 50 U/L (ref 39–117)
ALT: 13 U/L (ref 0–35)
ANION GAP: 14 (ref 5–15)
AST: 15 U/L (ref 0–37)
BILIRUBIN TOTAL: 0.3 mg/dL (ref 0.3–1.2)
BUN: 14 mg/dL (ref 6–23)
CO2: 21 mEq/L (ref 19–32)
CREATININE: 0.96 mg/dL (ref 0.50–1.10)
Calcium: 9.1 mg/dL (ref 8.4–10.5)
Chloride: 104 mEq/L (ref 96–112)
GFR calc non Af Amer: 75 mL/min — ABNORMAL LOW (ref >90)
GFR, EST AFRICAN AMERICAN: 87 mL/min — AB (ref >90)
Glucose, Bld: 87 mg/dL (ref 70–99)
Potassium: 4.1 mEq/L (ref 3.7–5.3)
Sodium: 139 mEq/L (ref 137–147)
TOTAL PROTEIN: 7.5 g/dL (ref 6.0–8.3)

## 2014-08-28 LAB — POCT PREGNANCY, URINE: Preg Test, Ur: NEGATIVE

## 2014-08-28 MED ORDER — HYDROCHLOROTHIAZIDE 25 MG PO TABS: 50.0000 mg | ORAL_TABLET | Freq: Every day | ORAL | Status: DC

## 2014-08-28 NOTE — Telephone Encounter (Signed)
Patient called and left message stating she is currently experiencing another bartholin's cyst and that she gets them every month after her cycle. She usually tries hot packs, baths and sitz baths and sees improvement but she has had this one for over three weeks now and it isn't getting better and feels like a hard knot. Called patient stating I am returning your phone call and that I will speak with one of our doctors and call her back. Patient verbalized understanding and had no other questions at this time.

## 2014-08-28 NOTE — Telephone Encounter (Signed)
Spoke to Dr Harolyn Rutherford who advised patient go to MAU for further evaluation due to possibility of surgery. Called patient and informed her of needing to go to MAU for further evaluation due to a chance of surgery since these are recurring for her. Patient verbalized understanding and stated she would go now. Patient had no other questions

## 2014-08-28 NOTE — MAU Provider Note (Signed)
Chief Complaint: Vaginal Pain and Bartholin's Cyst  First Provider Initiated Contact with Patient 08/28/14 1250     SUBJECTIVE HPI: Jillian Vasquez is a 38 y.o. G61P2003 female who presents with recurrent Bartholin's cyst. States she gets then after after menstrual cycle. The usually drain w/ application of warm compresses. Has had them I&D'd in the past, but strongly prefers definitive Tx. Rates pain 6/10 w/ mvmt, less w/ rest. Denies fever , chills.   Has chronic HTN on Lisinopril, Norvasc and HCTZ, although she ran out of HCTZ. Under care of [facility]. Normal ECG 06/2013.  Past Medical History  Diagnosis Date  . Asthma   . Hypertension   . Sinus headache   . Headache disorder     "from my BP"  . Hypertensive emergency without congestive heart failure 03/06/2012   OB History  Gravida Para Term Preterm AB SAB TAB Ectopic Multiple Living  2    0 0 0  1 3    # Outcome Date GA Lbr Len/2nd Weight Sex Delivery Anes PTL Lv  2 Gravida           1 Gravida              Past Surgical History  Procedure Laterality Date  . Tubal ligation  2000  . Cesarean section  2000   History   Social History  . Marital Status: Single    Spouse Name: N/A    Number of Children: N/A  . Years of Education: N/A   Occupational History  . Not on file.   Social History Main Topics  . Smoking status: Never Smoker   . Smokeless tobacco: Never Used  . Alcohol Use: Yes     Comment: 03/06/12 "special occasions"  . Drug Use: No  . Sexual Activity: Yes    Birth Control/ Protection: None   Other Topics Concern  . Not on file   Social History Narrative   No current facility-administered medications on file prior to encounter.   Current Outpatient Prescriptions on File Prior to Encounter  Medication Sig Dispense Refill  . amLODipine (NORVASC) 10 MG tablet Take 1 tablet (10 mg total) by mouth daily. 30 tablet 1  . lisinopril (PRINIVIL,ZESTRIL) 40 MG tablet Take 1 tablet (40 mg total) by  mouth daily. 30 tablet 0  . albuterol (PROVENTIL HFA;VENTOLIN HFA) 108 (90 BASE) MCG/ACT inhaler Inhale 2 puffs into the lungs every 6 (six) hours as needed for wheezing.     Marland Kitchen amoxicillin-clavulanate (AUGMENTIN) 875-125 MG per tablet Take 1 tablet by mouth 2 (two) times daily. (Patient not taking: Reported on 08/28/2014) 20 tablet 0  . cetirizine (ZYRTEC) 10 MG tablet Take 10 mg by mouth daily as needed for allergies.    . montelukast (SINGULAIR) 10 MG tablet Take 1 tablet (10 mg total) by mouth at bedtime. (Patient not taking: Reported on 08/28/2014) 30 tablet 1   No Known Allergies  ROS: Pertinent items in HPI. Neg for chest pain, SOB, fever, chills, vaginal discharge, abd pain.   OBJECTIVE Blood pressure 155/93, pulse 70, temperature 98.4 F (36.9 C), temperature source Oral, resp. rate 18, height 5' 1.5" (1.562 m), weight 70.398 kg (155 lb 3.2 oz), last menstrual period 07/30/2014, SpO2 100 %.  Patient Vitals for the past 24 hrs:  BP Temp Temp src Pulse Resp SpO2 Height Weight  08/28/14 1501 155/93 mmHg - - 70 18 - - -  08/28/14 1500 165/99 mmHg - - 70 - - - -  08/28/14 1428 (!) 161/107 mmHg - - - - - - -  08/28/14 1425 (!) 176/119 mmHg - - 73 18 - - -  08/28/14 1145 (!) 164/109 mmHg 98.4 F (36.9 C) Oral 74 16 100 % 5' 1.5" (1.562 m) 70.398 kg (155 lb 3.2 oz)    GENERAL: Well-developed, well-nourished female in mild distress.  HEENT: Normocephalic HEART: normal rate RESP: normal effort ABDOMEN: Soft, non-tender EXTREMITIES: Nontender, no edema NEURO: Alert and oriented PELVIC EXAM: NEFG except for 4x4 cm fluctuant mass in left lower introitus. No warmth, erythema or drainage. physiologic discharge, no blood noted.   LAB RESULTS Results for orders placed or performed during the hospital encounter of 08/28/14 (from the past 24 hour(s))  Pregnancy, urine POC     Status: None   Collection Time: 08/28/14 11:54 AM  Result Value Ref Range   Preg Test, Ur NEGATIVE NEGATIVE  CBC      Status: Abnormal   Collection Time: 08/28/14  2:23 PM  Result Value Ref Range   WBC 6.8 4.0 - 10.5 K/uL   RBC 4.95 3.87 - 5.11 MIL/uL   Hemoglobin 9.5 (L) 12.0 - 15.0 g/dL   HCT 32.2 (L) 36.0 - 46.0 %   MCV 65.1 (L) 78.0 - 100.0 fL   MCH 19.2 (L) 26.0 - 34.0 pg   MCHC 29.5 (L) 30.0 - 36.0 g/dL   RDW 19.3 (H) 11.5 - 15.5 %   Platelets 285 150 - 400 K/uL    IMAGING No results found.  MAU COURSE Discussed pt desire for definitive Tx w/ Dr. Elly Modena. Surgery scheduled tomorrow at 8:45 am.  Also discussed BP, CHTN, meds, ran out of HCTZ. Dr. Elly Modena OK w/ D/C now and planning to proceed w/ surgery. Refill pt's HCTZ.    ASSESSMENT 1. Bartholin's cyst   2. Essential hypertension     PLAN Discharge home in stable condition per consult w. Dr. Elly Modena.  Pt to arrive at [facility] at 7:00 for 8:45 procedure. NPO after MN. Take HCTZ ASAP. Take BP meds in am as scheduled w/ sip of water.      Follow-up Information    Follow up with [facility] On 08/29/2014.   Why:  at 7:00 am. Do not eat or drink anything after midnight tonight!   Contact information:   [address] [id number] [place] [phone]      Follow up with [facility].   Why:  As needed in emergencies   Contact information:   [address] [id number] [place] [phone]       Medication List    STOP taking these medications        amoxicillin-clavulanate 875-125 MG per tablet  Commonly known as:  AUGMENTIN     cetirizine 10 MG tablet  Commonly known as:  ZYRTEC     montelukast 10 MG tablet  Commonly known as:  SINGULAIR      TAKE these medications        albuterol 108 (90 BASE) MCG/ACT inhaler  Commonly known as:  PROVENTIL HFA;VENTOLIN HFA  Inhale 2 puffs into the lungs every 6 (six) hours as needed for wheezing.     amLODipine 10 MG tablet  Commonly known as:  NORVASC   Take 1 tablet (10 mg total) by mouth daily.     hydrochlorothiazide 25 MG tablet  Commonly known as:  HYDRODIURIL  Take 2 tablets (50 mg total) by mouth  daily.     ibuprofen 400 MG tablet  Commonly known as:  ADVIL,MOTRIN  Take 400 mg by mouth every 6 (six) hours as needed for moderate pain.     lisinopril 40 MG tablet  Commonly known as:  PRINIVIL,ZESTRIL  Take 1 tablet (40 mg total) by mouth daily.     multivitamin with minerals Tabs tablet  Take 1 tablet by mouth daily.       [place], [place] 08/28/2014  3:06 PM

## 2014-08-28 NOTE — Discharge Instructions (Signed)
Do not eat or drink anything after midnight tonight. You may take blood pressure medicines with a sip of water tomorrow morning before your procedure.   Bartholin's Cyst or Abscess Bartholin's glands are small glands located within the folds of skin (labia) along the sides of the lower opening of the vagina (birth canal). A cyst may develop when the duct of the gland becomes blocked. When this happens, fluid that accumulates within the cyst can become infected. This is known as an abscess. The Bartholin gland produces a mucous fluid to lubricate the outside of the vagina during sexual intercourse. SYMPTOMS   Patients with a small cyst may not have any symptoms.  Mild discomfort to severe pain depending on the size of the cyst and if it is infected (abscess).  Pain, redness, and swelling around the lower opening of the vagina.  Painful intercourse.  Pressure in the perineal area.  Swelling of the lips of the vagina (labia).  The cyst or abscess can be on one side or both sides of the vagina. DIAGNOSIS   A large swelling is seen in the lower vagina area by your caregiver.  Painful to touch.  Redness and pain, if it is an abscess. TREATMENT   Sometimes the cyst will go away on its own.  Apply warm wet compresses to the area or take hot sitz baths several times a day.  An incision to drain the cyst or abscess with local anesthesia.  Culture the pus, if it is an abscess.  Antibiotic treatment, if it is an abscess.  Cut open the gland and suture the edges to make the opening of the gland bigger (marsupialization).  Remove the whole gland if the cyst or abscess returns. PREVENTION   Practice good hygiene.  Clean the vaginal area with a mild soap and soft cloth when bathing.  Do not rub hard in the vaginal area when bathing.  Protect the crotch area with a padded cushion if you take long bike rides or ride horses.  Be sure you are well lubricated when you have sexual  intercourse. HOME CARE INSTRUCTIONS   If your cyst or abscess was opened, a small piece of gauze, or a drain, may have been placed in the wound to allow drainage. Do not remove this gauze or drain unless directed by your caregiver.  Wear feminine pads, not tampons, as needed for any drainage or bleeding.  If antibiotics were prescribed, take them exactly as directed. Finish the entire course.  Only take over-the-counter or prescription medicines for pain, discomfort, or fever as directed by your caregiver. SEEK IMMEDIATE MEDICAL CARE IF:   You have an increase in pain, redness, swelling, or drainage.  You have bleeding from the wound which results in the use of more than the number of pads suggested by your caregiver in 24 hours.  You have chills.  You have a fever.  You develop any new problems (symptoms) or aggravation of your existing condition. MAKE SURE YOU:   Understand these instructions.  Will watch your condition.  Will get help right away if you are not doing well or get worse. Document Released: 10/10/2005 Document Revised: 01/02/2012 Document Reviewed: 05/28/2008 [facility] Patient Information 2015 [place], [place]. This information is not intended to replace advice given to you by your health care provider. Make sure you discuss any questions you have with your health care provider.

## 2014-08-28 NOTE — MAU Note (Signed)
Patient states she has had a bartholin cyst for the past 2 weeks, getting bigger and more painful. Has a history of recurrent Bartholin cyst, monthly with period and usually drain.

## 2014-08-29 ENCOUNTER — Ambulatory Visit (HOSPITAL_COMMUNITY)
Admission: AD | Admit: 2014-08-29 | Discharge: 2014-08-29 | Disposition: A | Discharge status: Home / Self Care | Payer: Self-pay | Source: Ambulatory Visit | Attending: Obstetrics & Gynecology | Admitting: Obstetrics & Gynecology

## 2014-08-29 ENCOUNTER — Ambulatory Visit (HOSPITAL_COMMUNITY): Payer: MEDICAID | Admitting: Anesthesiology

## 2014-08-29 ENCOUNTER — Encounter (HOSPITAL_COMMUNITY)
Admission: AD | Disposition: A | Discharge status: Home / Self Care | Payer: Self-pay | Source: Ambulatory Visit | Attending: Obstetrics and Gynecology

## 2014-08-29 ENCOUNTER — Encounter (HOSPITAL_COMMUNITY)
Admission: AD | Disposition: A | Discharge status: Home / Self Care | Payer: Self-pay | Source: Ambulatory Visit | Attending: Obstetrics & Gynecology

## 2014-08-29 DIAGNOSIS — J45909 Unspecified asthma, uncomplicated: Secondary | ICD-10-CM | POA: Insufficient documentation

## 2014-08-29 DIAGNOSIS — N750 Cyst of Bartholin's gland: Secondary | ICD-10-CM

## 2014-08-29 DIAGNOSIS — D649 Anemia, unspecified: Secondary | ICD-10-CM | POA: Insufficient documentation

## 2014-08-29 DIAGNOSIS — N751 Abscess of Bartholin's gland: Secondary | ICD-10-CM | POA: Diagnosis present

## 2014-08-29 DIAGNOSIS — I10 Essential (primary) hypertension: Secondary | ICD-10-CM | POA: Insufficient documentation

## 2014-08-29 HISTORY — PX: BARTHOLIN CYST MARSUPIALIZATION: SHX5383

## 2014-08-29 SURGERY — MARSUPIALIZATION, CYST, BARTHOLIN'S GLAND
Anesthesia: Choice

## 2014-08-29 SURGERY — MARSUPIALIZATION, CYST, BARTHOLIN'S GLAND
Anesthesia: Monitor Anesthesia Care | Site: Vagina

## 2014-08-29 MED ORDER — LACTATED RINGERS IV SOLN
INTRAVENOUS | Status: DC
  Administered 2014-08-29: 08:00:00 via INTRAVENOUS

## 2014-08-29 MED ORDER — LIDOCAINE-EPINEPHRINE 1 %-1:100000 IJ SOLN
INTRAMUSCULAR | Status: DC | PRN
  Administered 2014-08-29: 8 mL

## 2014-08-29 MED ORDER — FENTANYL CITRATE 0.05 MG/ML IJ SOLN
INTRAMUSCULAR | Status: AC
  Filled 2014-08-29: qty 5

## 2014-08-29 MED ORDER — MIDAZOLAM HCL 2 MG/2ML IJ SOLN
INTRAMUSCULAR | Status: DC | PRN
  Administered 2014-08-29: 2 mg via INTRAVENOUS

## 2014-08-29 MED ORDER — DEXAMETHASONE SODIUM PHOSPHATE 4 MG/ML IJ SOLN
INTRAMUSCULAR | Status: DC | PRN
  Administered 2014-08-29: 4 mg via INTRAVENOUS

## 2014-08-29 MED ORDER — PROPOFOL 10 MG/ML IV EMUL
INTRAVENOUS | Status: AC
Start: 2014-08-29 — End: 2014-08-29
  Filled 2014-08-29: qty 20

## 2014-08-29 MED ORDER — DEXAMETHASONE SODIUM PHOSPHATE 10 MG/ML IJ SOLN
INTRAMUSCULAR | Status: AC
  Filled 2014-08-29: qty 1

## 2014-08-29 MED ORDER — GLYCOPYRROLATE 0.2 MG/ML IJ SOLN
INTRAMUSCULAR | Status: DC | PRN
  Administered 2014-08-29: 0.1 mg via INTRAVENOUS

## 2014-08-29 MED ORDER — FENTANYL CITRATE 0.05 MG/ML IJ SOLN
INTRAMUSCULAR | Status: DC | PRN
  Administered 2014-08-29: 100 ug via INTRAVENOUS
  Administered 2014-08-29: 50 ug via INTRAVENOUS

## 2014-08-29 MED ORDER — LIDOCAINE HCL (CARDIAC) 20 MG/ML IV SOLN
INTRAVENOUS | Status: DC | PRN
  Administered 2014-08-29: 80 mg via INTRAVENOUS

## 2014-08-29 MED ORDER — SULFAMETHOXAZOLE-TRIMETHOPRIM 800-160 MG PO TABS
1.0000 | ORAL_TABLET | Freq: Once | ORAL | Status: AC
  Administered 2014-08-29: 1 via ORAL
  Filled 2014-08-29: qty 1

## 2014-08-29 MED ORDER — ONDANSETRON HCL 4 MG/2ML IJ SOLN
INTRAMUSCULAR | Status: AC
  Filled 2014-08-29: qty 2

## 2014-08-29 MED ORDER — PROMETHAZINE HCL 25 MG/ML IJ SOLN: 6.2500 mg | INTRAMUSCULAR | Status: DC | PRN

## 2014-08-29 MED ORDER — PROPOFOL INFUSION 10 MG/ML OPTIME
INTRAVENOUS | Status: DC | PRN
  Administered 2014-08-29: 100 ug/kg/min via INTRAVENOUS

## 2014-08-29 MED ORDER — OXYCODONE-ACETAMINOPHEN 5-325 MG PO TABS
ORAL_TABLET | ORAL | Status: AC
  Filled 2014-08-29: qty 1

## 2014-08-29 MED ORDER — FENTANYL CITRATE 0.05 MG/ML IJ SOLN
25.0000 ug | INTRAMUSCULAR | Status: DC | PRN
  Administered 2014-08-29: 50 ug via INTRAVENOUS

## 2014-08-29 MED ORDER — SCOPOLAMINE 1 MG/3DAYS TD PT72
1.0000 | MEDICATED_PATCH | Freq: Once | TRANSDERMAL | Status: DC
  Administered 2014-08-29: 1.5 mg via TRANSDERMAL

## 2014-08-29 MED ORDER — LIDOCAINE HCL (CARDIAC) 20 MG/ML IV SOLN
INTRAVENOUS | Status: AC
  Filled 2014-08-29: qty 5

## 2014-08-29 MED ORDER — SULFAMETHOXAZOLE-TRIMETHOPRIM 800-160 MG PO TABS: 1.0000 | ORAL_TABLET | Freq: Two times a day (BID) | ORAL | Status: DC

## 2014-08-29 MED ORDER — ALBUTEROL SULFATE HFA 108 (90 BASE) MCG/ACT IN AERS
INHALATION_SPRAY | RESPIRATORY_TRACT | Status: AC
  Filled 2014-08-29: qty 6.7

## 2014-08-29 MED ORDER — ESTRADIOL 0.1 MG/GM VA CREA
TOPICAL_CREAM | VAGINAL | Status: AC
  Filled 2014-08-29: qty 42.5

## 2014-08-29 MED ORDER — OXYCODONE-ACETAMINOPHEN 5-325 MG PO TABS
1.0000 | ORAL_TABLET | ORAL | Status: DC | PRN
  Administered 2014-08-29: 1 via ORAL

## 2014-08-29 MED ORDER — SCOPOLAMINE 1 MG/3DAYS TD PT72
MEDICATED_PATCH | TRANSDERMAL | Status: AC
  Administered 2014-08-29: 1.5 mg via TRANSDERMAL
  Filled 2014-08-29: qty 1

## 2014-08-29 MED ORDER — DOCUSATE SODIUM 100 MG PO CAPS: 100.0000 mg | ORAL_CAPSULE | Freq: Two times a day (BID) | ORAL | Status: DC | PRN

## 2014-08-29 MED ORDER — ONDANSETRON HCL 4 MG/2ML IJ SOLN
INTRAMUSCULAR | Status: DC | PRN
  Administered 2014-08-29: 4 mg via INTRAVENOUS

## 2014-08-29 MED ORDER — OXYCODONE-ACETAMINOPHEN 5-325 MG PO TABS: 1.0000 | ORAL_TABLET | Freq: Four times a day (QID) | ORAL | Status: DC | PRN

## 2014-08-29 MED ORDER — IBUPROFEN 600 MG PO TABS: 600.0000 mg | ORAL_TABLET | Freq: Four times a day (QID) | ORAL | Status: DC | PRN

## 2014-08-29 MED ORDER — MIDAZOLAM HCL 2 MG/2ML IJ SOLN
INTRAMUSCULAR | Status: AC
Start: 2014-08-29 — End: 2014-08-29
  Filled 2014-08-29: qty 2

## 2014-08-29 MED ORDER — LACTATED RINGERS IV SOLN: INTRAVENOUS | Status: DC

## 2014-08-29 MED ORDER — MEPERIDINE HCL 25 MG/ML IJ SOLN: 6.2500 mg | INTRAMUSCULAR | Status: DC | PRN

## 2014-08-29 MED ORDER — PROPOFOL 10 MG/ML IV EMUL
INTRAVENOUS | Status: AC
  Filled 2014-08-29: qty 20

## 2014-08-29 MED ORDER — FENTANYL CITRATE 0.05 MG/ML IJ SOLN
INTRAMUSCULAR | Status: AC
  Administered 2014-08-29: 50 ug via INTRAVENOUS
  Filled 2014-08-29: qty 2

## 2014-08-29 MED ORDER — KETOROLAC TROMETHAMINE 30 MG/ML IJ SOLN: 15.0000 mg | Freq: Once | INTRAMUSCULAR | Status: DC | PRN

## 2014-08-29 MED ORDER — LIDOCAINE-EPINEPHRINE 1 %-1:100000 IJ SOLN
INTRAMUSCULAR | Status: AC
Start: 2014-08-29 — End: 2014-08-29
  Filled 2014-08-29: qty 1

## 2014-08-29 SURGICAL SUPPLY — 24 items
BLADE 11 SAFETY STRL DISP (BLADE) ×2 IMPLANT
CLOTH BEACON ORANGE TIMEOUT ST (SAFETY) ×2 IMPLANT
CONTAINER PREFILL 10% NBF 15ML (MISCELLANEOUS) ×2 IMPLANT
ELECT REM PT RETURN 9FT ADLT (ELECTROSURGICAL) ×2
ELECTRODE REM PT RTRN 9FT ADLT (ELECTROSURGICAL) IMPLANT
GLOVE BIOGEL PI IND STRL 7.0 (GLOVE) ×1 IMPLANT
GLOVE BIOGEL PI INDICATOR 7.0 (GLOVE) ×1
GLOVE ECLIPSE 7.0 STRL STRAW (GLOVE) ×2 IMPLANT
GOWN STRL REUS W/TWL LRG LVL3 (GOWN DISPOSABLE) ×4 IMPLANT
NDL HYPO 25X1 1.5 SAFETY (NEEDLE) ×1 IMPLANT
NEEDLE HYPO 25X1 1.5 SAFETY (NEEDLE) ×2 IMPLANT
PACK VAGINAL MINOR WOMEN LF (CUSTOM PROCEDURE TRAY) ×2 IMPLANT
PAD OB MATERNITY 4.3X12.25 (PERSONAL CARE ITEMS) ×2 IMPLANT
PAD PREP 24X48 CUFFED NSTRL (MISCELLANEOUS) ×2 IMPLANT
PENCIL BUTTON HOLSTER BLD 10FT (ELECTRODE) IMPLANT
SUT VIC AB 3-0 CT1 27 (SUTURE) ×4
SUT VIC AB 3-0 CT1 TAPERPNT 27 (SUTURE) IMPLANT
SWAB COLLECTION DEVICE MRSA (MISCELLANEOUS) IMPLANT
SYR 20CC LL (SYRINGE) ×2 IMPLANT
TOWEL OR 17X24 6PK STRL BLUE (TOWEL DISPOSABLE) ×4 IMPLANT
TUBE ANAEROBIC SPECIMEN COL (MISCELLANEOUS) ×1 IMPLANT
TUBING NON-CON 1/4 X 20 CONN (TUBING) IMPLANT
WATER STERILE IRR 1000ML POUR (IV SOLUTION) ×2 IMPLANT
YANKAUER SUCT BULB TIP NO VENT (SUCTIONS) IMPLANT

## 2014-08-29 NOTE — Transfer of Care (Signed)
Immediate Anesthesia Transfer of Care Note  Patient: Jillian Vasquez  Procedure(s) Performed: Procedure(s): BARTHOLIN CYST MARSUPIALIZATION (N/A)  Patient Location: PACU  Anesthesia Type:MAC  Level of Consciousness: awake, alert  and oriented  Airway & Oxygen Therapy: Patient Spontanous Breathing and Patient connected to nasal cannula oxygen  Post-op Assessment: Report given to PACU RN and Post -op Vital signs reviewed and stable  Post vital signs: Reviewed and stable  Complications: No apparent anesthesia complications

## 2014-08-29 NOTE — Anesthesia Postprocedure Evaluation (Signed)
  Anesthesia Post-op Note  Anesthesia Post Note  Patient: Jillian Vasquez  Procedure(s) Performed: Procedure(s) (LRB): BARTHOLIN CYST MARSUPIALIZATION (N/A)  Anesthesia type: MAC  Patient location: PACU  Post pain: Pain level controlled  Post assessment: Post-op Vital signs reviewed  Last Vitals:  Filed Vitals:   08/29/14 1030  BP: 124/80  Pulse: 79  Temp: 36.8 C  Resp: 12    Post vital signs: Reviewed  Level of consciousness: sedated  Complications: No apparent anesthesia complications

## 2014-08-29 NOTE — Discharge Instructions (Signed)
Bartholin's Cyst or Abscess °Bartholin's glands are small glands located within the folds of skin (labia) along the sides of the lower opening of the vagina (birth canal). A cyst may develop when the duct of the gland becomes blocked. When this happens, fluid that accumulates within the cyst can become infected. This is known as an abscess. The Bartholin gland produces a mucous fluid to lubricate the outside of the vagina during sexual intercourse. °SYMPTOMS  °· Patients with a small cyst may not have any symptoms. °· Mild discomfort to severe pain depending on the size of the cyst and if it is infected (abscess). °· Pain, redness, and swelling around the lower opening of the vagina. °· Painful intercourse. °· Pressure in the perineal area. °· Swelling of the lips of the vagina (labia). °· The cyst or abscess can be on one side or both sides of the vagina. °DIAGNOSIS  °· A large swelling is seen in the lower vagina area by your caregiver. °· Painful to touch. °· Redness and pain, if it is an abscess. °TREATMENT  °· Sometimes the cyst will go away on its own. °· Apply warm wet compresses to the area or take hot sitz baths several times a day. °· An incision to drain the cyst or abscess with local anesthesia. °· Culture the pus, if it is an abscess. °· Antibiotic treatment, if it is an abscess. °· Cut open the gland and suture the edges to make the opening of the gland bigger (marsupialization). °· Remove the whole gland if the cyst or abscess returns. °PREVENTION  °· Practice good hygiene. °· Clean the vaginal area with a mild soap and soft cloth when bathing. °· Do not rub hard in the vaginal area when bathing. °· Protect the crotch area with a padded cushion if you take long bike rides or ride horses. °· Be sure you are well lubricated when you have sexual intercourse. °HOME CARE INSTRUCTIONS  °· If your cyst or abscess was opened, a small piece of gauze, or a drain, may have been placed in the wound to allow  drainage. Do not remove this gauze or drain unless directed by your caregiver. °· Wear feminine pads, not tampons, as needed for any drainage or bleeding. °· If antibiotics were prescribed, take them exactly as directed. Finish the entire course. °· Only take over-the-counter or prescription medicines for pain, discomfort, or fever as directed by your caregiver. °SEEK IMMEDIATE MEDICAL CARE IF:  °· You have an increase in pain, redness, swelling, or drainage. °· You have bleeding from the wound which results in the use of more than the number of pads suggested by your caregiver in 24 hours. °· You have chills. °· You have a fever. °· You develop any new problems (symptoms) or aggravation of your existing condition. °MAKE SURE YOU:  °· Understand these instructions. °· Will watch your condition. °· Will get help right away if you are not doing well or get worse. °Document Released: 10/10/2005 Document Revised: 01/02/2012 Document Reviewed: 05/28/2008 °[employer]® Patient Information ©2015 [employer]. This information is not intended to replace advice given to you by your health care provider. Make sure you discuss any questions you have with your health care provider. ° °

## 2014-08-29 NOTE — Anesthesia Preprocedure Evaluation (Addendum)
Anesthesia Evaluation  Patient identified by MRN, date of birth, ID band Patient awake    Reviewed: Allergy & Precautions, H&P , NPO status , Patient's Chart, lab work & pertinent test results, reviewed documented beta blocker date and time   History of Anesthesia Complications Negative for: history of anesthetic complications  Airway Mallampati: II  TM Distance: >3 FB Neck ROM: full    Dental  (+) Teeth Intact   Pulmonary asthma (seen in ER three times for nebulizer treatments in Aug/Sept 2015, no steroids) ,  breath sounds clear to auscultation  Pulmonary exam normal - wheezing      Cardiovascular hypertension (on 3 meds, BP today 148/98), On Medications Rhythm:regular Rate:Normal     Neuro/Psych negative neurological ROS  negative psych ROS   GI/Hepatic negative GI ROS, Neg liver ROS,   Endo/Other  negative endocrine ROS  Renal/GU negative Renal ROS  Female GU complaint     Musculoskeletal   Abdominal   Peds  Hematology  (+) anemia ,   Anesthesia Other Findings   Reproductive/Obstetrics negative OB ROS                            Anesthesia Physical Anesthesia Plan  ASA: II  Anesthesia Plan: MAC   Post-op Pain Management:    Induction:   Airway Management Planned:   Additional Equipment:   Intra-op Plan:   Post-operative Plan:   Informed Consent: I have reviewed the patients History and Physical, chart, labs and discussed the procedure including the risks, benefits and alternatives for the proposed anesthesia with the patient or authorized representative who has indicated his/her understanding and acceptance.   Dental Advisory Given  Plan Discussed with: CRNA and Surgeon  Anesthesia Plan Comments:        Anesthesia Quick Evaluation

## 2014-08-29 NOTE — H&P (Signed)
Preoperative History and Physical  Jillian Vasquez is a 38 y.o. [phone] here for surgical management of recurrent Bartholin's gland cyst/abscess.   Was seen in [facility]U yesterday, declined I&D and Ward catheter placement.  No significant preoperative concerns.  Proposed surgery: Marsupialization of Bartholin's gland cyst  Past Medical History  Diagnosis Date  . Asthma   . Hypertension   . Sinus headache   . Headache disorder     "from my BP"  . Hypertensive emergency without congestive heart failure 03/06/2012   Past Surgical History  Procedure Laterality Date  . Tubal ligation  2000  . Cesarean section  2000   OB History    Gravida Para Term Preterm AB TAB SAB Ectopic Multiple Living   2 2 2   0 0 0  1 3    Patient denies any cervical dysplasia or STIs.  No current facility-administered medications on file prior to encounter.   Current Outpatient Prescriptions on File Prior to Encounter  Medication Sig Dispense Refill  . amLODipine (NORVASC) 10 MG tablet Take 1 tablet (10 mg total) by mouth daily. 30 tablet 1  . hydrochlorothiazide (HYDRODIURIL) 25 MG tablet Take 2 tablets (50 mg total) by mouth daily. 60 tablet 1  . ibuprofen (ADVIL,MOTRIN) 400 MG tablet Take 400 mg by mouth every 6 (six) hours as needed for moderate pain.    Marland Kitchen lisinopril (PRINIVIL,ZESTRIL) 40 MG tablet Take 1 tablet (40 mg total) by mouth daily. 30 tablet 0  . Multiple Vitamin (MULTIVITAMIN WITH MINERALS) TABS tablet Take 1 tablet by mouth daily.    Marland Kitchen albuterol (PROVENTIL HFA;VENTOLIN HFA) 108 (90 BASE) MCG/ACT inhaler Inhale 2 puffs into the lungs every 6 (six) hours as needed for wheezing.      No Known Allergies   Social History:   reports that she has never smoked. She has never used smokeless tobacco. She reports that she drinks alcohol. She reports that she does not use illicit drugs.  Family History  Problem Relation Age of Onset  . Hypertension Mother   . Hypertension Father   . Hypertension  Maternal Aunt   . Hypertension Maternal Uncle    Review of Systems: Noncontributory  PHYSICAL EXAM: Blood pressure 148/98, pulse 90, temperature 98.4 F (36.9 C), temperature source Oral, resp. rate 18, last menstrual period 07/30/2014, SpO2 100 %. General appearance - alert, well appearing, and in no distress Chest - clear to auscultation, no wheezes, rales or rhonchi, symmetric air entry Heart - normal rate and regular rhythm Abdomen - soft, nontender, nondistended, no masses or organomegaly Pelvic - examination not indicated Extremities - peripheral pulses normal, no pedal edema, no clubbing or cyanosis  Labs: Results for orders placed or performed during the hospital encounter of 08/28/14 (from the past 336 hour(s))  Pregnancy, urine POC   Collection Time: 08/28/14 11:54 AM  Result Value Ref Range   Preg Test, Ur NEGATIVE NEGATIVE  CBC   Collection Time: 08/28/14  2:23 PM  Result Value Ref Range   WBC 6.8 4.0 - 10.5 K/uL   RBC 4.95 3.87 - 5.11 MIL/uL   Hemoglobin 9.5 (L) 12.0 - 15.0 g/dL   HCT 32.2 (L) 36.0 - 46.0 %   MCV 65.1 (L) 78.0 - 100.0 fL   MCH 19.2 (L) 26.0 - 34.0 pg   MCHC 29.5 (L) 30.0 - 36.0 g/dL   RDW 19.3 (H) 11.5 - 15.5 %   Platelets 285 150 - 400 K/uL  Comprehensive metabolic panel   Collection Time: 08/28/14  2:23 PM  Result Value Ref Range   Sodium 139 137 - 147 mEq/L   Potassium 4.1 3.7 - 5.3 mEq/L   Chloride 104 96 - 112 mEq/L   CO2 21 19 - 32 mEq/L   Glucose, Bld 87 70 - 99 mg/dL   BUN 14 6 - 23 mg/dL   Creatinine, Ser 0.96 0.50 - 1.10 mg/dL   Calcium 9.1 8.4 - 10.5 mg/dL   Total Protein 7.5 6.0 - 8.3 g/dL   Albumin 4.0 3.5 - 5.2 g/dL   AST 15 0 - 37 U/L   ALT 13 0 - 35 U/L   Alkaline Phosphatase 50 39 - 117 U/L   Total Bilirubin 0.3 0.3 - 1.2 mg/dL   GFR calc non Af Amer 75 (L) >90 mL/min   GFR calc Af Amer 87 (L) >90 mL/min   Anion gap 14 5 - 15    Assessment: Patient Active Problem List   Diagnosis Date Noted  . Bartholin's gland  abscess s/p marsupialization on 08/29/14 08/29/2014  . Wheezing 06/27/2014  . Wheezing on expiration 06/20/2014  . Acute sinusitis 03/07/2014  . Shortness of breath 01/03/2013  . Left hand weakness 03/30/2012  . Anemia 03/15/2012  . Hypokalemia 03/07/2012  . Overweight 05/21/2009  . HYPERTENSION, BENIGN SYSTEMIC 12/21/2006    Plan: Patient will undergo surgical management with marsupialization of recurrent Bartholin's gland cyst.   The risks of surgery were discussed in detail with the patient including but not limited to: bleeding, infection which may require antibiotics, injury to surrounding organs, need for additional procedures, surgical site problems and other postoperative/anesthesia complications. Likelihood of success in alleviating the patient's condition was discussed. Routine postoperative instructions will be reviewed with the patient and her family in detail after surgery.  The patient concurred with the proposed plan, giving informed written consent for the surgery.  Patient has been NPO since last night she will remain NPO for procedure.  Anesthesia and OR aware.  Preoperative prophylactic antibiotics and SCDs ordered on call to the OR.  To OR when ready.   Verita Schneiders, M.D. 08/29/2014 7:53 AM

## 2014-08-29 NOTE — Op Note (Signed)
  Jillian Vasquez PROCEDURE DATE: 08/29/2014  PREOPERATIVE DIAGNOSIS:  Recurrent Left Bartholin's Abscess POSTOPERATIVE DIAGNOSIS: The same PROCEDURE: Marsupialization of Left Bartholin's Abscess SURGEON:  Dr. Verita Schneiders  INDICATIONS: 38 y.o. [id number] here for management of recurrent left Bartholin's abscess after previous incision and drainage and Word catheter placement.  Risks of surgery were discussed with the patient including but not limited to: bleeding, infection which may require antibiotics, injury to surrounding organs, need for additional procedures, and other postoperative/anesthesia complications. Written informed consent was obtained.    FINDINGS:  4 cm Bartholin's abscess with significant purulent drainage. Culture obtained.    ANESTHESIA:    MACl INTRAVENOUS FLUIDS: 1000  ml ESTIMATED BLOOD LOSS: 5 ml SPECIMENS: None COMPLICATIONS: None immediate  PROCEDURE IN DETAIL:  The patient received oral Bactrim DS while in the preoperative area.  She was then taken to the operating room where MAC was administered and was found to be adequate.  She was placed in the dorsal lithotomy position, and was prepped and draped in a sterile manner.  Her bladder was catheterized for clear, yellow urine. After an adequate timeout was performed, attention was turned to the left side of her vulva where the abscess was palpated. 1% lidocaine with epinephrine was injected and an incision was made over the Bartholin's abscess around the introitus, and into the cyst wall.  A significant amount of purulent, foul-smelling material was expressed, culture sample was obtained.   A hemostat was used to break up loculations within the abscess and there was significant bloody drainage.  The cyst wall was everted and sutured into the edges of the vestibular mucosa using interrupted 3-0 Vicryl stitches.  The cyst was irrigated with normal saline, good hemostasis was noted.  The patient will be discharged to  home as per PACU criteria.  Routine postoperative instructions given.  She was prescribed Percocet, Ibuprofen and Colace; she was also prescribed a ten day course of Bactrim DS.  She will follow up in the clinic on 09/10/2013 for postoperative evaluation.   Verita Schneiders, MD, [place] Attending Jillian Vasquez for [employer], [place]

## 2014-09-02 ENCOUNTER — Encounter (HOSPITAL_COMMUNITY): Payer: Self-pay | Admitting: Obstetrics & Gynecology

## 2014-09-10 ENCOUNTER — Encounter: Payer: Self-pay | Admitting: Obstetrics & Gynecology

## 2014-09-10 ENCOUNTER — Ambulatory Visit (INDEPENDENT_AMBULATORY_CARE_PROVIDER_SITE_OTHER): Payer: Self-pay | Admitting: Obstetrics & Gynecology

## 2014-09-10 VITALS — BP 141/94 | HR 79 | Temp 98.3°F | Resp 20 | Ht 61.0 in | Wt 153.7 lb

## 2014-09-10 DIAGNOSIS — Z09 Encounter for follow-up examination after completed treatment for conditions other than malignant neoplasm: Secondary | ICD-10-CM

## 2014-09-10 DIAGNOSIS — Z23 Encounter for immunization: Secondary | ICD-10-CM

## 2014-09-10 NOTE — Patient Instructions (Signed)
Preventive Care for Adults A healthy lifestyle and preventive care can promote health and wellness. Preventive health guidelines for women include the following key practices.  A routine yearly physical is a good way to check with your health care provider about your health and preventive screening. It is a chance to share any concerns and updates on your health and to receive a thorough exam.  Visit your dentist for a routine exam and preventive care every 6 months. Brush your teeth twice a day and floss once a day. Good oral hygiene prevents tooth decay and gum disease.  The frequency of eye exams is based on your age, health, family medical history, use of contact lenses, and other factors. Follow your health care provider's recommendations for frequency of eye exams.  Eat a healthy diet. Foods like vegetables, fruits, whole grains, low-fat dairy products, and lean protein foods contain the nutrients you need without too many calories. Decrease your intake of foods high in solid fats, added sugars, and salt. Eat the right amount of calories for you.Get information about a proper diet from your health care provider, if necessary.  Regular physical exercise is one of the most important things you can do for your health. Most adults should get at least 150 minutes of moderate-intensity exercise (any activity that increases your heart rate and causes you to sweat) each week. In addition, most adults need muscle-strengthening exercises on 2 or more days a week.  Maintain a healthy weight. The body mass index (BMI) is a screening tool to identify possible weight problems. It provides an estimate of body fat based on height and weight. Your health care provider can find your BMI and can help you achieve or maintain a healthy weight.For adults 20 years and older:  A BMI below 18.5 is considered underweight.  A BMI of 18.5 to 24.9 is normal.  A BMI of 25 to 29.9 is considered overweight.  A BMI of  30 and above is considered obese.  Maintain normal blood lipids and cholesterol levels by exercising and minimizing your intake of saturated fat. Eat a balanced diet with plenty of fruit and vegetables. Blood tests for lipids and cholesterol should begin at age 76 and be repeated every 5 years. If your lipid or cholesterol levels are high, you are over 50, or you are at high risk for heart disease, you may need your cholesterol levels checked more frequently.Ongoing high lipid and cholesterol levels should be treated with medicines if diet and exercise are not working.  If you smoke, find out from your health care provider how to quit. If you do not use tobacco, do not start.  Lung cancer screening is recommended for adults aged 22-80 years who are at high risk for developing lung cancer because of a history of smoking. A yearly low-dose CT scan of the lungs is recommended for people who have at least a 30-pack-year history of smoking and are a current smoker or have quit within the past 15 years. A pack year of smoking is smoking an average of 1 pack of cigarettes a day for 1 year (for example: 1 pack a day for 30 years or 2 packs a day for 15 years). Yearly screening should continue until the smoker has stopped smoking for at least 15 years. Yearly screening should be stopped for people who develop a health problem that would prevent them from having lung cancer treatment.  If you are pregnant, do not drink alcohol. If you are breastfeeding,  be very cautious about drinking alcohol. If you are not pregnant and choose to drink alcohol, do not have more than 1 drink per day. One drink is considered to be 12 ounces (355 mL) of beer, 5 ounces (148 mL) of wine, or 1.5 ounces (44 mL) of liquor.  Avoid use of street drugs. Do not share needles with anyone. Ask for help if you need support or instructions about stopping the use of drugs.  High blood pressure causes heart disease and increases the risk of  stroke. Your blood pressure should be checked at least every 1 to 2 years. Ongoing high blood pressure should be treated with medicines if weight loss and exercise do not work.  If you are 75-52 years old, ask your health care provider if you should take aspirin to prevent strokes.  Diabetes screening involves taking a blood sample to check your fasting blood sugar level. This should be done once every 3 years, after age 15, if you are within normal weight and without risk factors for diabetes. Testing should be considered at a younger age or be carried out more frequently if you are overweight and have at least 1 risk factor for diabetes.  Breast cancer screening is essential preventive care for women. You should practice "breast self-awareness." This means understanding the normal appearance and feel of your breasts and may include breast self-examination. Any changes detected, no matter how small, should be reported to a health care provider. Women in their 58s and 30s should have a clinical breast exam (CBE) by a health care provider as part of a regular health exam every 1 to 3 years. After age 16, women should have a CBE every year. Starting at age 53, women should consider having a mammogram (breast X-ray test) every year. Women who have a family history of breast cancer should talk to their health care provider about genetic screening. Women at a high risk of breast cancer should talk to their health care providers about having an MRI and a mammogram every year.  Breast cancer gene (BRCA)-related cancer risk assessment is recommended for women who have family members with BRCA-related cancers. BRCA-related cancers include breast, ovarian, tubal, and peritoneal cancers. Having family members with these cancers may be associated with an increased risk for harmful changes (mutations) in the breast cancer genes BRCA1 and BRCA2. Results of the assessment will determine the need for genetic counseling and  BRCA1 and BRCA2 testing.  Routine pelvic exams to screen for cancer are no longer recommended for nonpregnant women who are considered low risk for cancer of the pelvic organs (ovaries, uterus, and vagina) and who do not have symptoms. Ask your health care provider if a screening pelvic exam is right for you.  If you have had past treatment for cervical cancer or a condition that could lead to cancer, you need Pap tests and screening for cancer for at least 20 years after your treatment. If Pap tests have been discontinued, your risk factors (such as having a new sexual partner) need to be reassessed to determine if screening should be resumed. Some women have medical problems that increase the chance of getting cervical cancer. In these cases, your health care provider may recommend more frequent screening and Pap tests.  The HPV test is an additional test that may be used for cervical cancer screening. The HPV test looks for the virus that can cause the cell changes on the cervix. The cells collected during the Pap test can be  tested for HPV. The HPV test could be used to screen women aged 30 years and older, and should be used in women of any age who have unclear Pap test results. After the age of 30, women should have HPV testing at the same frequency as a Pap test.  Colorectal cancer can be detected and often prevented. Most routine colorectal cancer screening begins at the age of 50 years and continues through age 75 years. However, your health care provider may recommend screening at an earlier age if you have risk factors for colon cancer. On a yearly basis, your health care provider may provide home test kits to check for hidden blood in the stool. Use of a small camera at the end of a tube, to directly examine the colon (sigmoidoscopy or colonoscopy), can detect the earliest forms of colorectal cancer. Talk to your health care provider about this at age 50, when routine screening begins. Direct  exam of the colon should be repeated every 5-10 years through age 75 years, unless early forms of pre-cancerous polyps or small growths are found.  People who are at an increased risk for hepatitis B should be screened for this virus. You are considered at high risk for hepatitis B if:  You were born in a country where hepatitis B occurs often. Talk with your health care provider about which countries are considered high risk.  Your parents were born in a high-risk country and you have not received a shot to protect against hepatitis B (hepatitis B vaccine).  You have HIV or AIDS.  You use needles to inject street drugs.  You live with, or have sex with, someone who has hepatitis B.  You get hemodialysis treatment.  You take certain medicines for conditions like cancer, organ transplantation, and autoimmune conditions.  Hepatitis C blood testing is recommended for all people born from 1945 through 1965 and any individual with known risks for hepatitis C.  Practice safe sex. Use condoms and avoid high-risk sexual practices to reduce the spread of sexually transmitted infections (STIs). STIs include gonorrhea, chlamydia, syphilis, trichomonas, herpes, HPV, and human immunodeficiency virus (HIV). Herpes, HIV, and HPV are viral illnesses that have no cure. They can result in disability, cancer, and death.  You should be screened for sexually transmitted illnesses (STIs) including gonorrhea and chlamydia if:  You are sexually active and are younger than 24 years.  You are older than 24 years and your health care provider tells you that you are at risk for this type of infection.  Your sexual activity has changed since you were last screened and you are at an increased risk for chlamydia or gonorrhea. Ask your health care provider if you are at risk.  If you are at risk of being infected with HIV, it is recommended that you take a prescription medicine daily to prevent HIV infection. This is  called preexposure prophylaxis (PrEP). You are considered at risk if:  You are a heterosexual woman, are sexually active, and are at increased risk for HIV infection.  You take drugs by injection.  You are sexually active with a partner who has HIV.  Talk with your health care provider about whether you are at high risk of being infected with HIV. If you choose to begin PrEP, you should first be tested for HIV. You should then be tested every 3 months for as long as you are taking PrEP.  Osteoporosis is a disease in which the bones lose minerals and strength   with aging. This can result in serious bone fractures or breaks. The risk of osteoporosis can be identified using a bone density scan. Women ages 65 years and over and women at risk for fractures or osteoporosis should discuss screening with their health care providers. Ask your health care provider whether you should take a calcium supplement or vitamin D to reduce the rate of osteoporosis.  Menopause can be associated with physical symptoms and risks. Hormone replacement therapy is available to decrease symptoms and risks. You should talk to your health care provider about whether hormone replacement therapy is right for you.  Use sunscreen. Apply sunscreen liberally and repeatedly throughout the day. You should seek shade when your shadow is shorter than you. Protect yourself by wearing long sleeves, pants, a wide-brimmed hat, and sunglasses year round, whenever you are outdoors.  Once a month, do a whole body skin exam, using a mirror to look at the skin on your back. Tell your health care provider of new moles, moles that have irregular borders, moles that are larger than a pencil eraser, or moles that have changed in shape or color.  Stay current with required vaccines (immunizations).  Influenza vaccine. All adults should be immunized every year.  Tetanus, diphtheria, and acellular pertussis (Td, Tdap) vaccine. Pregnant women should  receive 1 dose of Tdap vaccine during each pregnancy. The dose should be obtained regardless of the length of time since the last dose. Immunization is preferred during the 27th-36th week of gestation. An adult who has not previously received Tdap or who does not know her vaccine status should receive 1 dose of Tdap. This initial dose should be followed by tetanus and diphtheria toxoids (Td) booster doses every 10 years. Adults with an unknown or incomplete history of completing a 3-dose immunization series with Td-containing vaccines should begin or complete a primary immunization series including a Tdap dose. Adults should receive a Td booster every 10 years.  Varicella vaccine. An adult without evidence of immunity to varicella should receive 2 doses or a second dose if she has previously received 1 dose. Pregnant females who do not have evidence of immunity should receive the first dose after pregnancy. This first dose should be obtained before leaving the health care facility. The second dose should be obtained 4-8 weeks after the first dose.  Human papillomavirus (HPV) vaccine. Females aged 13-26 years who have not received the vaccine previously should obtain the 3-dose series. The vaccine is not recommended for use in pregnant females. However, pregnancy testing is not needed before receiving a dose. If a female is found to be pregnant after receiving a dose, no treatment is needed. In that case, the remaining doses should be delayed until after the pregnancy. Immunization is recommended for any person with an immunocompromised condition through the age of 26 years if she did not get any or all doses earlier. During the 3-dose series, the second dose should be obtained 4-8 weeks after the first dose. The third dose should be obtained 24 weeks after the first dose and 16 weeks after the second dose.  Zoster vaccine. One dose is recommended for adults aged 60 years or older unless certain conditions are  present.  Measles, mumps, and rubella (MMR) vaccine. Adults born before 1957 generally are considered immune to measles and mumps. Adults born in 1957 or later should have 1 or more doses of MMR vaccine unless there is a contraindication to the vaccine or there is laboratory evidence of immunity to   each of the three diseases. A routine second dose of MMR vaccine should be obtained at least 28 days after the first dose for students attending postsecondary schools, health care workers, or international travelers. People who received inactivated measles vaccine or an unknown type of measles vaccine during 1963-1967 should receive 2 doses of MMR vaccine. People who received inactivated mumps vaccine or an unknown type of mumps vaccine before 1979 and are at high risk for mumps infection should consider immunization with 2 doses of MMR vaccine. For females of childbearing age, rubella immunity should be determined. If there is no evidence of immunity, females who are not pregnant should be vaccinated. If there is no evidence of immunity, females who are pregnant should delay immunization until after pregnancy. Unvaccinated health care workers born before 1957 who lack laboratory evidence of measles, mumps, or rubella immunity or laboratory confirmation of disease should consider measles and mumps immunization with 2 doses of MMR vaccine or rubella immunization with 1 dose of MMR vaccine.  Pneumococcal 13-valent conjugate (PCV13) vaccine. When indicated, a person who is uncertain of her immunization history and has no record of immunization should receive the PCV13 vaccine. An adult aged 19 years or older who has certain medical conditions and has not been previously immunized should receive 1 dose of PCV13 vaccine. This PCV13 should be followed with a dose of pneumococcal polysaccharide (PPSV23) vaccine. The PPSV23 vaccine dose should be obtained at least 8 weeks after the dose of PCV13 vaccine. An adult aged 19  years or older who has certain medical conditions and previously received 1 or more doses of PPSV23 vaccine should receive 1 dose of PCV13. The PCV13 vaccine dose should be obtained 1 or more years after the last PPSV23 vaccine dose.  Pneumococcal polysaccharide (PPSV23) vaccine. When PCV13 is also indicated, PCV13 should be obtained first. All adults aged 65 years and older should be immunized. An adult younger than age 65 years who has certain medical conditions should be immunized. Any person who resides in a nursing home or long-term care facility should be immunized. An adult smoker should be immunized. People with an immunocompromised condition and certain other conditions should receive both PCV13 and PPSV23 vaccines. People with human immunodeficiency virus (HIV) infection should be immunized as soon as possible after diagnosis. Immunization during chemotherapy or radiation therapy should be avoided. Routine use of PPSV23 vaccine is not recommended for American Indians, [place] Natives, or people younger than 65 years unless there are medical conditions that require PPSV23 vaccine. When indicated, people who have unknown immunization and have no record of immunization should receive PPSV23 vaccine. One-time revaccination 5 years after the first dose of PPSV23 is recommended for people aged 19-64 years who have chronic kidney failure, nephrotic syndrome, asplenia, or immunocompromised conditions. People who received 1-2 doses of PPSV23 before age 65 years should receive another dose of PPSV23 vaccine at age 65 years or later if at least 5 years have passed since the previous dose. Doses of PPSV23 are not needed for people immunized with PPSV23 at or after age 65 years.  Meningococcal vaccine. Adults with asplenia or persistent complement component deficiencies should receive 2 doses of quadrivalent meningococcal conjugate (MenACWY-D) vaccine. The doses should be obtained at least 2 months apart.  Microbiologists working with certain meningococcal bacteria, military recruits, people at risk during an outbreak, and people who travel to or live in countries with a high rate of meningitis should be immunized. A first-year college student up through age   21 years who is living in a residence hall should receive a dose if she did not receive a dose on or after her 16th birthday. Adults who have certain high-risk conditions should receive one or more doses of vaccine.  Hepatitis A vaccine. Adults who wish to be protected from this disease, have certain high-risk conditions, work with hepatitis A-infected animals, work in hepatitis A research labs, or travel to or work in countries with a high rate of hepatitis A should be immunized. Adults who were previously unvaccinated and who anticipate close contact with an international adoptee during the first 60 days after arrival in the [place] from a country with a high rate of hepatitis A should be immunized.  Hepatitis B vaccine. Adults who wish to be protected from this disease, have certain high-risk conditions, may be exposed to blood or other infectious body fluids, are household contacts or sex partners of hepatitis B positive people, are clients or workers in certain care facilities, or travel to or work in countries with a high rate of hepatitis B should be immunized.  Haemophilus influenzae type b (Hib) vaccine. A previously unvaccinated person with asplenia or sickle cell disease or having a scheduled splenectomy should receive 1 dose of Hib vaccine. Regardless of previous immunization, a recipient of a hematopoietic stem cell transplant should receive a 3-dose series 6-12 months after her successful transplant. Hib vaccine is not recommended for adults with HIV infection. Preventive Services / Frequency Ages 64 to 68 years  Blood pressure check.** / Every 1 to 2 years.  Lipid and cholesterol check.** / Every 5 years beginning at age  22.  Clinical breast exam.** / Every 3 years for women in their 88s and 53s.  BRCA-related cancer risk assessment.** / For women who have family members with a BRCA-related cancer (breast, ovarian, tubal, or peritoneal cancers).  Pap test.** / Every 2 years from ages 90 through 51. Every 3 years starting at age 21 through age 56 or 3 with a history of 3 consecutive normal Pap tests.  HPV screening.** / Every 3 years from ages 24 through ages 1 to 46 with a history of 3 consecutive normal Pap tests.  Hepatitis C blood test.** / For any individual with known risks for hepatitis C.  Skin self-exam. / Monthly.  Influenza vaccine. / Every year.  Tetanus, diphtheria, and acellular pertussis (Tdap, Td) vaccine.** / Consult your health care provider. Pregnant women should receive 1 dose of Tdap vaccine during each pregnancy. 1 dose of Td every 10 years.  Varicella vaccine.** / Consult your health care provider. Pregnant females who do not have evidence of immunity should receive the first dose after pregnancy.  HPV vaccine. / 3 doses over 6 months, if 72 and younger. The vaccine is not recommended for use in pregnant females. However, pregnancy testing is not needed before receiving a dose.  Measles, mumps, rubella (MMR) vaccine.** / You need at least 1 dose of MMR if you were born in 1957 or later. You may also need a 2nd dose. For females of childbearing age, rubella immunity should be determined. If there is no evidence of immunity, females who are not pregnant should be vaccinated. If there is no evidence of immunity, females who are pregnant should delay immunization until after pregnancy.  Pneumococcal 13-valent conjugate (PCV13) vaccine.** / Consult your health care provider.  Pneumococcal polysaccharide (PPSV23) vaccine.** / 1 to 2 doses if you smoke cigarettes or if you have certain conditions.  Meningococcal vaccine.** /  1 dose if you are age 19 to 21 years and a first-year college  student living in a residence hall, or have one of several medical conditions, you need to get vaccinated against meningococcal disease. You may also need additional booster doses.  Hepatitis A vaccine.** / Consult your health care provider.  Hepatitis B vaccine.** / Consult your health care provider.  Haemophilus influenzae type b (Hib) vaccine.** / Consult your health care provider. Ages 40 to 64 years  Blood pressure check.** / Every 1 to 2 years.  Lipid and cholesterol check.** / Every 5 years beginning at age 20 years.  Lung cancer screening. / Every year if you are aged 55-80 years and have a 30-pack-year history of smoking and currently smoke or have quit within the past 15 years. Yearly screening is stopped once you have quit smoking for at least 15 years or develop a health problem that would prevent you from having lung cancer treatment.  Clinical breast exam.** / Every year after age 40 years.  BRCA-related cancer risk assessment.** / For women who have family members with a BRCA-related cancer (breast, ovarian, tubal, or peritoneal cancers).  Mammogram.** / Every year beginning at age 40 years and continuing for as long as you are in good health. Consult with your health care provider.  Pap test.** / Every 3 years starting at age 30 years through age 65 or 70 years with a history of 3 consecutive normal Pap tests.  HPV screening.** / Every 3 years from ages 30 years through ages 65 to 70 years with a history of 3 consecutive normal Pap tests.  Fecal occult blood test (FOBT) of stool. / Every year beginning at age 50 years and continuing until age 75 years. You may not need to do this test if you get a colonoscopy every 10 years.  Flexible sigmoidoscopy or colonoscopy.** / Every 5 years for a flexible sigmoidoscopy or every 10 years for a colonoscopy beginning at age 50 years and continuing until age 75 years.  Hepatitis C blood test.** / For all people born from 1945 through  1965 and any individual with known risks for hepatitis C.  Skin self-exam. / Monthly.  Influenza vaccine. / Every year.  Tetanus, diphtheria, and acellular pertussis (Tdap/Td) vaccine.** / Consult your health care provider. Pregnant women should receive 1 dose of Tdap vaccine during each pregnancy. 1 dose of Td every 10 years.  Varicella vaccine.** / Consult your health care provider. Pregnant females who do not have evidence of immunity should receive the first dose after pregnancy.  Zoster vaccine.** / 1 dose for adults aged 60 years or older.  Measles, mumps, rubella (MMR) vaccine.** / You need at least 1 dose of MMR if you were born in 1957 or later. You may also need a 2nd dose. For females of childbearing age, rubella immunity should be determined. If there is no evidence of immunity, females who are not pregnant should be vaccinated. If there is no evidence of immunity, females who are pregnant should delay immunization until after pregnancy.  Pneumococcal 13-valent conjugate (PCV13) vaccine.** / Consult your health care provider.  Pneumococcal polysaccharide (PPSV23) vaccine.** / 1 to 2 doses if you smoke cigarettes or if you have certain conditions.  Meningococcal vaccine.** / Consult your health care provider.  Hepatitis A vaccine.** / Consult your health care provider.  Hepatitis B vaccine.** / Consult your health care provider.  Haemophilus influenzae type b (Hib) vaccine.** / Consult your health care provider. Ages 65   years and over  Blood pressure check.** / Every 1 to 2 years.  Lipid and cholesterol check.** / Every 5 years beginning at age 22 years.  Lung cancer screening. / Every year if you are aged 73-80 years and have a 30-pack-year history of smoking and currently smoke or have quit within the past 15 years. Yearly screening is stopped once you have quit smoking for at least 15 years or develop a health problem that would prevent you from having lung cancer  treatment.  Clinical breast exam.** / Every year after age 4 years.  BRCA-related cancer risk assessment.** / For women who have family members with a BRCA-related cancer (breast, ovarian, tubal, or peritoneal cancers).  Mammogram.** / Every year beginning at age 40 years and continuing for as long as you are in good health. Consult with your health care provider.  Pap test.** / Every 3 years starting at age 9 years through age 34 or 91 years with 3 consecutive normal Pap tests. Testing can be stopped between 65 and 70 years with 3 consecutive normal Pap tests and no abnormal Pap or HPV tests in the past 10 years.  HPV screening.** / Every 3 years from ages 57 years through ages 64 or 45 years with a history of 3 consecutive normal Pap tests. Testing can be stopped between 65 and 70 years with 3 consecutive normal Pap tests and no abnormal Pap or HPV tests in the past 10 years.  Fecal occult blood test (FOBT) of stool. / Every year beginning at age 15 years and continuing until age 17 years. You may not need to do this test if you get a colonoscopy every 10 years.  Flexible sigmoidoscopy or colonoscopy.** / Every 5 years for a flexible sigmoidoscopy or every 10 years for a colonoscopy beginning at age 86 years and continuing until age 71 years.  Hepatitis C blood test.** / For all people born from [birth year] through 1964 and any individual with known risks for hepatitis C.  Osteoporosis screening.** / A one-time screening for women ages 83 years and over and women at risk for fractures or osteoporosis.  Skin self-exam. / Monthly.  Influenza vaccine. / Every year.  Tetanus, diphtheria, and acellular pertussis (Tdap/Td) vaccine.** / 1 dose of Td every 10 years.  Varicella vaccine.** / Consult your health care provider.  Zoster vaccine.** / 1 dose for adults aged 61 years or older.  Pneumococcal 13-valent conjugate (PCV13) vaccine.** / Consult your health care provider.  Pneumococcal  polysaccharide (PPSV23) vaccine.** / 1 dose for all adults aged 28 years and older.  Meningococcal vaccine.** / Consult your health care provider.  Hepatitis A vaccine.** / Consult your health care provider.  Hepatitis B vaccine.** / Consult your health care provider.  Haemophilus influenzae type b (Hib) vaccine.** / Consult your health care provider. ** Family history and personal history of risk and conditions may change your health care provider's recommendations. Document Released: 12/06/2001 Document Revised: 02/24/2014 Document Reviewed: 03/07/2011 [facility] Patient Information 2015 [place], [place]. This information is not intended to replace advice given to you by your health care provider. Make sure you discuss any questions you have with your health care provider.

## 2014-09-10 NOTE — Progress Notes (Signed)
   CLINIC ENCOUNTER NOTE  History:  38 y.o. [id number] here today for postop check after marsupialization of left Bartholin's abscess on 08/29/14. She reports no complaints since surgery. No other GYN concerns.  The following portions of the patient's history were reviewed and updated as appropriate: allergies, current medications, past family history, past medical history, past social history, past surgical history and problem list.  Normal pap in 2009.   Review of Systems:  Pertinent items are noted in HPI.  Objective:  Physical Exam BP 141/94 mmHg  Pulse 79  Temp(Src) 98.3 F (36.8 C) (Oral)  Resp 20  Ht 5\' 1"  (1.549 m)  Wt 153 lb 11.2 oz (69.718 kg)  BMI 29.06 kg/m2  LMP 08/31/2014 Gen: NAD Abd: Soft, nontender and nondistended Pelvic: Normal appearing external genitalia; marsupialization site still patient and healing well. Sutures still visible.  Assessment & Plan:  Normal postop check. Patient will schedule appointment for pap smear soon; also given information about free cervical cancer screening clinics. Flu shot given today Routine preventative health maintenance measures emphasized.   Verita Schneiders, MD, [place] Attending [place] for [employer], [place]

## 2014-09-10 NOTE — Progress Notes (Signed)
Pt desires flu vaccine today

## 2014-10-23 ENCOUNTER — Telehealth: Payer: Self-pay | Admitting: Family Medicine

## 2014-10-23 DIAGNOSIS — I10 Essential (primary) hypertension: Secondary | ICD-10-CM

## 2014-10-23 MED ORDER — HYDROCHLOROTHIAZIDE 25 MG PO TABS: 50.0000 mg | ORAL_TABLET | Freq: Every day | ORAL | Status: DC

## 2014-10-23 MED ORDER — AMLODIPINE BESYLATE 10 MG PO TABS: 10.0000 mg | ORAL_TABLET | Freq: Every day | ORAL | Status: DC

## 2014-10-23 MED ORDER — LISINOPRIL 40 MG PO TABS: 40.0000 mg | ORAL_TABLET | Freq: Every day | ORAL | Status: DC

## 2014-10-23 NOTE — Telephone Encounter (Signed)
Patient paged [facility] Emergency Line around [phone], called back and spoke with patient, Jillian Vasquez, she stated that she has been out of all BP meds (Amodipine, HCTZ, Lisinopril) x 1 week and has been getting high blood pressure readings, with occasional headaches. BP readings at home ranging from SBP 130-140s and DBP 90s. Last [facility] apt 06/2014, previously advised to f/u for HTN but stated that she has been unable to schedule an appointment recently due to work. Denies other symptoms at this time of CP, SOB, loss of vision, weakness.  I advised her that we do not normally prescribe chronic meds through Emergency Line, but given New Years Eve Holiday and weekend I would make an exception, sent 1 month supply rx Amlodipine, HCTZ, and Lisinopril to [place], strongly advised patient to schedule apt to see PCP to f/u HTN, call [facility] Monday 10/27/14. Patient agreed, understood, red flags given, advised page back if symptoms worsen.  Nobie Putnam, [place], PGY-2

## 2015-01-02 ENCOUNTER — Other Ambulatory Visit: Payer: Self-pay | Admitting: Family Medicine

## 2015-01-02 ENCOUNTER — Other Ambulatory Visit: Payer: Self-pay | Admitting: *Deleted

## 2015-01-02 DIAGNOSIS — I10 Essential (primary) hypertension: Secondary | ICD-10-CM

## 2015-01-02 MED ORDER — LISINOPRIL 40 MG PO TABS: 40.0000 mg | ORAL_TABLET | Freq: Every day | ORAL | Status: DC

## 2015-01-02 MED ORDER — AMLODIPINE BESYLATE 10 MG PO TABS: 10.0000 mg | ORAL_TABLET | Freq: Every day | ORAL | Status: DC

## 2015-01-02 MED ORDER — HYDROCHLOROTHIAZIDE 25 MG PO TABS: 50.0000 mg | ORAL_TABLET | Freq: Every day | ORAL | Status: DC

## 2015-01-02 NOTE — Telephone Encounter (Signed)
Pt stated she is out of medication.  Medication were refilled per standing order. Pt has an appt 01/15/2015 at 8:45.  Derl Barrow, RN Resent to [employer] [facility]; not [facility].  Derl Barrow, RN'

## 2015-01-02 NOTE — Telephone Encounter (Signed)
Out of her amlodipine,lisinopril and hctz.  Patient experiencing headaches.  Need to have refills until visit.  Appt scheduled with Dr. Awanda Mink on 3/24 @ 8:45.  Dr. Raeford Razor have no availabilities.

## 2015-01-03 ENCOUNTER — Other Ambulatory Visit: Payer: Self-pay | Admitting: Family Medicine

## 2015-01-05 ENCOUNTER — Other Ambulatory Visit: Payer: Self-pay | Admitting: Family Medicine

## 2015-01-15 ENCOUNTER — Ambulatory Visit (INDEPENDENT_AMBULATORY_CARE_PROVIDER_SITE_OTHER): Payer: 59 | Admitting: Family Medicine

## 2015-01-15 ENCOUNTER — Encounter: Payer: Self-pay | Admitting: Family Medicine

## 2015-01-15 VITALS — BP 141/95 | HR 83 | Temp 97.9°F | Ht 61.0 in | Wt 161.5 lb

## 2015-01-15 DIAGNOSIS — I10 Essential (primary) hypertension: Secondary | ICD-10-CM | POA: Diagnosis not present

## 2015-01-15 LAB — BASIC METABOLIC PANEL
BUN: 16 mg/dL (ref 6–23)
CHLORIDE: 102 meq/L (ref 96–112)
CO2: 24 mEq/L (ref 19–32)
Calcium: 9.1 mg/dL (ref 8.4–10.5)
Creat: 0.98 mg/dL (ref 0.50–1.10)
Glucose, Bld: 91 mg/dL (ref 70–99)
Potassium: 4.4 mEq/L (ref 3.5–5.3)
SODIUM: 136 meq/L (ref 135–145)

## 2015-01-15 MED ORDER — AZELASTINE-FLUTICASONE 137-50 MCG/ACT NA SUSP: 1.0000 | Freq: Two times a day (BID) | NASAL | Status: DC

## 2015-01-15 MED ORDER — LISINOPRIL-HYDROCHLOROTHIAZIDE 20-25 MG PO TABS: 2.0000 | ORAL_TABLET | Freq: Every day | ORAL | Status: DC

## 2015-01-15 NOTE — Progress Notes (Signed)
Jillian Vasquez is a 39 y.o. female who presents today for blood pressure assessment.  Blood pressure - Elevated since pregnancy several years ago.  Recently increased to lisinopril 40 mg qd, HCTZ 50 mg daily, Norvasc 10 mg daily.  Denies palpitations, cough, dizziness, hypokalemia Sx.    Past Medical History  Diagnosis Date  . Asthma   . Hypertension   . Sinus headache   . Headache disorder     "from my BP"  . Hypertensive emergency without congestive heart failure 03/06/2012    History  Smoking status  . Never Smoker   Smokeless tobacco  . Never Used    Family History  Problem Relation Age of Onset  . Hypertension Mother   . Hypertension Father   . Hypertension Maternal Aunt   . Hypertension Maternal Uncle     Current Outpatient Prescriptions on File Prior to Visit  Medication Sig Dispense Refill  . albuterol (PROVENTIL HFA;VENTOLIN HFA) 108 (90 BASE) MCG/ACT inhaler Inhale 2 puffs into the lungs every 6 (six) hours as needed for wheezing.     Marland Kitchen amLODipine (NORVASC) 10 MG tablet Take 1 tablet (10 mg total) by mouth daily. 30 tablet 0  . docusate sodium (COLACE) 100 MG capsule Take 1 capsule (100 mg total) by mouth 2 (two) times daily as needed. 30 capsule 2  . ibuprofen (ADVIL,MOTRIN) 600 MG tablet Take 1 tablet (600 mg total) by mouth every 6 (six) hours as needed for headache, mild pain or moderate pain. 30 tablet 3  . Multiple Vitamin (MULTIVITAMIN WITH MINERALS) TABS tablet Take 1 tablet by mouth daily.     No current facility-administered medications on file prior to visit.    ROS: Per HPI.  All other systems reviewed and are negative.   Physical Exam Filed Vitals:   01/15/15 0855  BP: 141/95  Pulse: 83  Temp: 97.9 F (36.6 C)    Physical Examination: General appearance - alert, well appearing, and in no distress Chest - clear to auscultation, no wheezes, rales or rhonchi, symmetric air entry Cardio: RRR    Chemistry      Component Value Date/Time    NA 139 08/28/2014 1423   K 4.1 08/28/2014 1423   CL 104 08/28/2014 1423   CO2 21 08/28/2014 1423   BUN 14 08/28/2014 1423   CREATININE 0.96 08/28/2014 1423   CREATININE 0.89 10/22/2012 0916      Component Value Date/Time   CALCIUM 9.1 08/28/2014 1423   ALKPHOS 50 08/28/2014 1423   AST 15 08/28/2014 1423   ALT 13 08/28/2014 1423   BILITOT 0.3 08/28/2014 1423      Lab Results  Component Value Date   HGBA1C 6.0* 03/06/2012

## 2015-01-15 NOTE — Patient Instructions (Signed)
Hydrochlorothiazide, HCTZ; Lisinopril tablets What is this medicine? HYDROCHLOROTHIAZIDE; LISINOPRIL (hye droe klor oh THYE a zide; lyse IN oh pril) is a combination of a diuretic and an ACE inhibitor. It is used to treat high blood pressure. This medicine may be used for other purposes; ask your health care provider or pharmacist if you have questions. COMMON BRAND NAME(S): Prinzide, Zestoretic What should I tell my health care provider before I take this medicine? They need to know if you have any of these conditions: -bone marrow disease -decreased urine -heart or blood vessel disease -if you are on a special diet like a low salt diet -immune system problems, like lupus -kidney disease -liver disease -previous swelling of the tongue, face, or lips with difficulty breathing, difficulty swallowing, hoarseness, or tightening of the throat -recent heart attack or stroke -an unusual or allergic reaction to lisinopril, hydrochlorothiazide, sulfa drugs, other medicines, insect venom, foods, dyes, or preservatives -pregnant or trying to get pregnant -breast-feeding How should I use this medicine? Take this medicine by mouth with a glass of water. Follow the directions on the prescription label. You can take it with or without food. If it upsets your stomach, take it with food. Take your medicine at regular intervals. Do not take it more often than directed. Do not stop taking except on your doctor's advice. Talk to your pediatrician regarding the use of this medicine in children. Special care may be needed. Overdosage: If you think you have taken too much of this medicine contact a poison control center or emergency room at once. NOTE: This medicine is only for you. Do not share this medicine with others. What if I miss a dose? If you miss a dose, take it as soon as you can. If it is almost time for your next dose, take only that dose. Do not take double or extra doses. What may interact with  this medicine? -barbiturates like phenobarbital -blood pressure medicines -corticosteroids like prednisone -diabetic medications -diuretics, especially triamterene, spironolactone or amiloride -lithium -NSAIDs like ibuprofen -potassium salts or potassium supplements -prescription pain medicines -skeletal muscle relaxants like tubocurarine -some cholesterol lowering medications like cholestyramine or colestipol This list may not describe all possible interactions. Give your health care provider a list of all the medicines, herbs, non-prescription drugs, or dietary supplements you use. Also tell them if you smoke, drink alcohol, or use illegal drugs. Some items may interact with your medicine. What should I watch for while using this medicine? Visit your doctor or health care professional for regular checks on your progress. Check your blood pressure as directed. Ask your doctor or health care professional what your blood pressure should be and when you should contact him or her. Call your doctor or health care professional if you notice an irregular or fast heart beat. You must not get dehydrated. Ask your doctor or health care professional how much fluid you need to drink a day. Check with him or her if you get an attack of severe diarrhea, nausea and vomiting, or if you sweat a lot. The loss of too much body fluid can make it dangerous for you to take this medicine. Women should inform their doctor if they wish to become pregnant or think they might be pregnant. There is a potential for serious side effects to an unborn child. Talk to your health care professional or pharmacist for more information. You may get drowsy or dizzy. Do not drive, use machinery, or do anything that needs mental alertness until  you know how this drug affects you. Do not stand or sit up quickly, especially if you are an older patient. This reduces the risk of dizzy or fainting spells. Alcohol can make you more drowsy and  dizzy. Avoid alcoholic drinks. This medicine may affect your blood sugar level. If you have diabetes, check with your doctor or health care professional before changing the dose of your diabetic medicine. Avoid salt substitutes unless you are told otherwise by your doctor or health care professional. This medicine can make you more sensitive to the sun. Keep out of the sun. If you cannot avoid being in the sun, wear protective clothing and use sunscreen. Do not use sun lamps or tanning beds/booths. Do not treat yourself for coughs, colds, or pain while you are taking this medicine without asking your doctor or health care professional for advice. Some ingredients may increase your blood pressure. What side effects may I notice from receiving this medicine? Side effects that you should report to your doctor or health care professional as soon as possible: -changes in vision -confusion, dizziness, light headedness or fainting spells -decreased amount of urine passed -difficulty breathing or swallowing, hoarseness, or tightening of the throat -eye pain -fast or irregular heart beat, palpitations, or chest pain -muscle cramps -nausea and vomiting -persistent dry cough -redness, blistering, peeling or loosening of the skin, including inside the mouth -stomach pain -swelling of your face, lips, tongue, hands, or feet -unusual rash, bleeding or bruising, or pinpoint red spots on the skin -worsened gout pain -yellowing of the eyes or skin Side effects that usually do not require medical attention (report to your doctor or health care professional if they continue or are bothersome): -change in sex drive or performance -cough -headache This list may not describe all possible side effects. Call your doctor for medical advice about side effects. You may report side effects to FDA at 1-800-FDA-1088. Where should I keep my medicine? Keep out of the reach of children. Store at room temperature between  20 and 25 degrees C (68 and 77 degrees F). Protect from moisture and excessive light. Keep container tightly closed. Throw away any unused medicine after the expiration date. NOTE: This sheet is a summary. It may not cover all possible information. If you have questions about this medicine, talk to your doctor, pharmacist, or health care provider.  2015, Elsevier/Gold Standard. (2010-06-30 13:33:52)

## 2015-01-15 NOTE — Assessment & Plan Note (Signed)
Consolidated to Zestoretic 20-25 two pills qd Continue Norvasc Check BMET today F/U in 4 weeks to see how doing Consider further testing with CBC, Renal US, EKG, Lipid Panel, Micro:Creat in future

## 2015-01-19 ENCOUNTER — Encounter: Payer: Self-pay | Admitting: Family Medicine

## 2015-01-20 ENCOUNTER — Encounter: Payer: Self-pay | Admitting: *Deleted

## 2015-01-20 NOTE — Progress Notes (Signed)
Prior Authorization received from [employer] for [employer]. PA form completed and faxed to [facility] for review.  Derl Barrow, RN

## 2015-01-21 NOTE — Progress Notes (Signed)
Received PA approval for [facility] via [employer].  Med approved for 01/20/15 - 01/20/16.  [place] pharmacy informed.  PA confirmation number [id number]. Derl Barrow, RN

## 2015-03-06 ENCOUNTER — Emergency Department (HOSPITAL_COMMUNITY): Payer: 59

## 2015-03-06 ENCOUNTER — Emergency Department (HOSPITAL_COMMUNITY)
Admission: EM | Admit: 2015-03-06 | Discharge: 2015-03-06 | Disposition: A | Discharge status: Home / Self Care | Payer: 59 | Attending: Emergency Medicine | Admitting: Emergency Medicine

## 2015-03-06 ENCOUNTER — Encounter (HOSPITAL_COMMUNITY): Payer: Self-pay | Admitting: Emergency Medicine

## 2015-03-06 DIAGNOSIS — R05 Cough: Secondary | ICD-10-CM

## 2015-03-06 DIAGNOSIS — J45901 Unspecified asthma with (acute) exacerbation: Secondary | ICD-10-CM | POA: Insufficient documentation

## 2015-03-06 DIAGNOSIS — Z79899 Other long term (current) drug therapy: Secondary | ICD-10-CM | POA: Insufficient documentation

## 2015-03-06 DIAGNOSIS — I119 Hypertensive heart disease without heart failure: Secondary | ICD-10-CM | POA: Insufficient documentation

## 2015-03-06 DIAGNOSIS — R059 Cough, unspecified: Secondary | ICD-10-CM

## 2015-03-06 MED ORDER — ALBUTEROL SULFATE (2.5 MG/3ML) 0.083% IN NEBU
5.0000 mg | INHALATION_SOLUTION | Freq: Once | RESPIRATORY_TRACT | Status: AC
  Administered 2015-03-06: 5 mg via RESPIRATORY_TRACT
  Filled 2015-03-06: qty 6

## 2015-03-06 MED ORDER — IPRATROPIUM-ALBUTEROL 0.5-2.5 (3) MG/3ML IN SOLN
3.0000 mL | Freq: Once | RESPIRATORY_TRACT | Status: AC
  Administered 2015-03-06: 3 mL via RESPIRATORY_TRACT
  Filled 2015-03-06: qty 3

## 2015-03-06 MED ORDER — HYDROCOD POLST-CPM POLST ER 10-8 MG/5ML PO SUER: 5.0000 mL | Freq: Two times a day (BID) | ORAL | Status: DC | PRN

## 2015-03-06 MED ORDER — ALBUTEROL SULFATE HFA 108 (90 BASE) MCG/ACT IN AERS
2.0000 | INHALATION_SPRAY | RESPIRATORY_TRACT | Status: DC | PRN
  Administered 2015-03-06: 2 via RESPIRATORY_TRACT
  Filled 2015-03-06: qty 6.7

## 2015-03-06 MED ORDER — PREDNISONE 20 MG PO TABS: 40.0000 mg | ORAL_TABLET | Freq: Every day | ORAL | Status: DC

## 2015-03-06 MED ORDER — PREDNISONE 20 MG PO TABS
60.0000 mg | ORAL_TABLET | Freq: Once | ORAL | Status: AC
  Administered 2015-03-06: 60 mg via ORAL
  Filled 2015-03-06: qty 3

## 2015-03-06 NOTE — ED Notes (Signed)
Initial Contact - pt A+Ox4, c/o SOB, reports flu like symp x2 weeks ago and with cough/cold symptoms since, sts asthma exacerbation x1 week, using albuterol inhaler q2-4hours with minimal relief.  Pt also reports L ant and post pleuritic chest pain, nonradiating, reproducible with cough and deep inspiration.  Pt denies fevers/chills.  Speaking full/clear sentences, rr even/un-lab.  Appears uncomfortable.  Skin PWD.  MAEI.  Ambulatory with steady gait.  NAD.

## 2015-03-06 NOTE — ED Provider Notes (Signed)
CSN: [id number]     Arrival date & time 03/06/15  1023 History   First MD Initiated Contact with Patient 03/06/15 1031     Chief Complaint  Patient presents with  . Asthma    exacerbation x1 week with cough/cold symptoms  . Pleurisy    "pain in my lungs from coughing so much"     (Consider location/radiation/quality/duration/timing/severity/associated sxs/prior Treatment) HPI Comments: Pt comes in today with c/o problems with asthma exacerbation. She states that over the last week she has need her inhaler every 2 to four hours. She has also had congestion and cough. She states that last week she has flu like symptoms with fever. She has not had fever for the last weeks. She is having pain with coughing.   The history is provided by the patient. No language interpreter was used.    Past Medical History  Diagnosis Date  . Asthma   . Hypertension   . Sinus headache   . Headache disorder     "from my BP"  . Hypertensive emergency without congestive heart failure 03/06/2012   Past Surgical History  Procedure Laterality Date  . Tubal ligation  2000  . Cesarean section  2000  . Bartholin cyst marsupialization N/A 08/29/2014    Procedure: BARTHOLIN CYST MARSUPIALIZATION;  Surgeon: Osborne Oman, MD;  Location: [facility];  Service: Gynecology;  Laterality: N/A;   Family History  Problem Relation Age of Onset  . Hypertension Mother   . Hypertension Father   . Hypertension Maternal Aunt   . Hypertension Maternal Uncle    History  Substance Use Topics  . Smoking status: Never Smoker   . Smokeless tobacco: Never Used  . Alcohol Use: Yes     Comment: 03/06/12 "special occasions"   OB History    Gravida Para Term Preterm AB TAB SAB Ectopic Multiple Living   2 2 2   0 0 0  1 3     Review of Systems  All other systems reviewed and are negative.     Allergies  Review of patient's allergies indicates no known allergies.  Home Medications   Prior to Admission medications    Medication Sig Start Date End Date Taking? Authorizing Provider  albuterol (PROVENTIL HFA;VENTOLIN HFA) 108 (90 BASE) MCG/ACT inhaler Inhale 2 puffs into the lungs every 6 (six) hours as needed for wheezing.    Yes Historical Provider, MD  amLODipine (NORVASC) 10 MG tablet Take 1 tablet (10 mg total) by mouth daily. 01/02/15 01/01/16 Yes Rosemarie Ax, MD  ibuprofen (ADVIL,MOTRIN) 600 MG tablet Take 1 tablet (600 mg total) by mouth every 6 (six) hours as needed for headache, mild pain or moderate pain. 08/29/14  Yes Osborne Oman, MD  lisinopril-hydrochlorothiazide (PRINZIDE,ZESTORETIC) 20-25 MG per tablet Take 2 tablets by mouth daily. 01/15/15  Yes Nolon Rod, DO  Multiple Vitamin (MULTIVITAMIN WITH MINERALS) TABS tablet Take 1 tablet by mouth daily.   Yes Historical Provider, MD  Azelastine-Fluticasone 137-50 MCG/ACT SUSP Place 1 spray into the nose 2 (two) times daily. Patient not taking: Reported on 03/06/2015 01/15/15   Nolon Rod, DO  docusate sodium (COLACE) 100 MG capsule Take 1 capsule (100 mg total) by mouth 2 (two) times daily as needed. Patient not taking: Reported on 03/06/2015 08/29/14   Osborne Oman, MD   BP 164/108 mmHg  Pulse 73  Temp(Src) 98.1 F (36.7 C) (Oral)  Resp 22  Ht 5' (1.524 m)  Wt 155 lb (70.308 kg)  BMI 30.27 kg/m2  SpO2 100%  LMP 02/22/2015 Physical Exam  Constitutional: She is oriented to person, place, and time. She appears well-developed and well-nourished.  HENT:  Left Ear: External ear normal.  Nose: Rhinorrhea present.  Eyes: EOM are normal. Pupils are equal, round, and reactive to light.  Neck: Neck supple.  Cardiovascular: Normal rate and regular rhythm.   Pulmonary/Chest: No respiratory distress. She has wheezes.  Musculoskeletal: Normal range of motion.  Neurological: She is oriented to person, place, and time.  Skin: Skin is warm and dry.  Psychiatric: She has a normal mood and affect.  Nursing note and vitals reviewed.   ED  Course  Procedures (including critical care time) Labs Review Labs Reviewed - No data to display  Imaging Review Dg Chest 2 View  03/06/2015   CLINICAL DATA:  Worsening shortness of breath and cough over the last 2 weeks.  EXAM: CHEST  2 VIEW  COMPARISON:  06/27/2014  FINDINGS: Heart size is normal. Mediastinal shadows are normal. There is mild chronic scarring in the perihilar regions bilaterally. No evidence of active infiltrate, mass, effusion or collapse. No bony abnormality.  IMPRESSION: Mild chronic pulmonary scarring.  No active disease.   Electronically Signed   By: Nelson Chimes M.D.   On: 03/06/2015 11:22     EKG Interpretation None      MDM   Final diagnoses:  Asthma exacerbation  Cough    11:26 AM Pt still wheezing. Will give another treatment 11:56 AM Pt doing better. Will send home with cough syrup and prednisone. Discussed over use of inhaler with pt  Glendell Docker, NP 03/06/15 1156  Davonna Belling, MD 03/06/15 1550

## 2015-03-06 NOTE — Discharge Instructions (Signed)

## 2015-04-26 ENCOUNTER — Other Ambulatory Visit: Payer: Self-pay | Admitting: Family Medicine

## 2015-04-26 DIAGNOSIS — I10 Essential (primary) hypertension: Secondary | ICD-10-CM

## 2015-04-26 NOTE — Telephone Encounter (Signed)
Emergency Line / After Hours Call  Patient calling about anti-hypertensives but she called her different pharmacies and figured out the problem. She will take her medications once she picks them up.   Clearance Coots, MD Family Medicine PGY-3

## 2015-07-06 ENCOUNTER — Other Ambulatory Visit: Payer: Self-pay | Admitting: Family Medicine

## 2015-07-30 ENCOUNTER — Emergency Department (HOSPITAL_COMMUNITY): Payer: 59

## 2015-07-30 ENCOUNTER — Encounter (HOSPITAL_COMMUNITY): Payer: Self-pay | Admitting: Emergency Medicine

## 2015-07-30 ENCOUNTER — Emergency Department (HOSPITAL_COMMUNITY)
Admission: EM | Admit: 2015-07-30 | Discharge: 2015-07-30 | Disposition: A | Discharge status: Home / Self Care | Payer: 59 | Attending: Emergency Medicine | Admitting: Emergency Medicine

## 2015-07-30 DIAGNOSIS — Z79899 Other long term (current) drug therapy: Secondary | ICD-10-CM | POA: Insufficient documentation

## 2015-07-30 DIAGNOSIS — I10 Essential (primary) hypertension: Secondary | ICD-10-CM | POA: Insufficient documentation

## 2015-07-30 DIAGNOSIS — J159 Unspecified bacterial pneumonia: Secondary | ICD-10-CM | POA: Insufficient documentation

## 2015-07-30 DIAGNOSIS — J189 Pneumonia, unspecified organism: Secondary | ICD-10-CM

## 2015-07-30 DIAGNOSIS — J45901 Unspecified asthma with (acute) exacerbation: Secondary | ICD-10-CM | POA: Insufficient documentation

## 2015-07-30 LAB — CBC
HCT: 33.3 % — ABNORMAL LOW (ref 36.0–46.0)
Hemoglobin: 9.4 g/dL — ABNORMAL LOW (ref 12.0–15.0)
MCH: 18.3 pg — ABNORMAL LOW (ref 26.0–34.0)
MCHC: 28.2 g/dL — AB (ref 30.0–36.0)
MCV: 64.7 fL — ABNORMAL LOW (ref 78.0–100.0)
Platelets: 355 10*3/uL (ref 150–400)
RBC: 5.15 MIL/uL — ABNORMAL HIGH (ref 3.87–5.11)
RDW: 19.3 % — ABNORMAL HIGH (ref 11.5–15.5)
WBC: 8.1 10*3/uL (ref 4.0–10.5)

## 2015-07-30 LAB — BASIC METABOLIC PANEL
Anion gap: 7 (ref 5–15)
BUN: 7 mg/dL (ref 6–20)
CALCIUM: 9.1 mg/dL (ref 8.9–10.3)
CO2: 26 mmol/L (ref 22–32)
Chloride: 102 mmol/L (ref 101–111)
Creatinine, Ser: 0.99 mg/dL (ref 0.44–1.00)
Glucose, Bld: 106 mg/dL — ABNORMAL HIGH (ref 65–99)
POTASSIUM: 3.7 mmol/L (ref 3.5–5.1)
Sodium: 135 mmol/L (ref 135–145)

## 2015-07-30 LAB — I-STAT TROPONIN, ED: TROPONIN I, POC: 0.01 ng/mL (ref 0.00–0.08)

## 2015-07-30 MED ORDER — PREDNISONE 20 MG PO TABS: 40.0000 mg | ORAL_TABLET | Freq: Every day | ORAL | Status: DC

## 2015-07-30 MED ORDER — ALBUTEROL SULFATE (2.5 MG/3ML) 0.083% IN NEBU
5.0000 mg | INHALATION_SOLUTION | Freq: Once | RESPIRATORY_TRACT | Status: DC
  Filled 2015-07-30: qty 6

## 2015-07-30 MED ORDER — ALBUTEROL SULFATE (2.5 MG/3ML) 0.083% IN NEBU
5.0000 mg | INHALATION_SOLUTION | Freq: Once | RESPIRATORY_TRACT | Status: AC
  Administered 2015-07-30: 5 mg via RESPIRATORY_TRACT

## 2015-07-30 MED ORDER — ALBUTEROL SULFATE HFA 108 (90 BASE) MCG/ACT IN AERS: 2.0000 | INHALATION_SPRAY | RESPIRATORY_TRACT | Status: DC | PRN

## 2015-07-30 MED ORDER — CEFTRIAXONE SODIUM 1 G IJ SOLR
1.0000 g | Freq: Once | INTRAMUSCULAR | Status: AC
  Administered 2015-07-30: 1 g via INTRAMUSCULAR
  Filled 2015-07-30: qty 10

## 2015-07-30 MED ORDER — AZITHROMYCIN 250 MG PO TABS: 250.0000 mg | ORAL_TABLET | Freq: Every day | ORAL | Status: DC

## 2015-07-30 MED ORDER — LIDOCAINE HCL (PF) 1 % IJ SOLN
2.1000 mL | Freq: Once | INTRAMUSCULAR | Status: AC
  Administered 2015-07-30: 2.1 mL
  Filled 2015-07-30: qty 5

## 2015-07-30 NOTE — ED Notes (Addendum)
43 yof presents to ED with flu like symptoms (runny nose, sore throat, cough, SOB) since Monday. Patient states she has been making tylenol extra strength for the pain and cough medicine and that it was helping her and first but now it is not effective. Patient states she has vomiting twice since feeling sick on Monday. Wheezes noted to left upper and lower lung fields. Patient is tearful in triage. C/O chest pain with coughing and deep breathing. Denies nausea.

## 2015-07-30 NOTE — Discharge Instructions (Signed)

## 2015-07-30 NOTE — ED Provider Notes (Signed)
CSN: [id number]     Arrival date & time 07/30/15  1316 History  By signing my name below, I, Starleen Arms, attest that this documentation has been prepared under the direction and in the presence of Noemi Chapel, MD. Electronically Signed: Starleen Arms, ED Scribe. 07/30/2015. 2:48 PM.    Chief Complaint  Patient presents with  . Fatigue  . Cough   The history is provided by the patient. No language interpreter was used.   HPI Comments: Jillian Vasquez is a 39 y.o. female with hx of asthma (albuterol) who presents to the Emergency Department complaining of a fever onset Monday.  Associated symptoms include a severe, persistent cough; chills and myalgias.   Persistent, gradually worsening, nothing makes better or worse - has hx of Asthma.  Past Medical History  Diagnosis Date  . Asthma   . Hypertension   . Sinus headache   . Headache disorder     "from my BP"  . Hypertensive emergency without congestive heart failure 03/06/2012   Past Surgical History  Procedure Laterality Date  . Tubal ligation  2000  . Cesarean section  2000  . Bartholin cyst marsupialization N/A 08/29/2014    Procedure: BARTHOLIN CYST MARSUPIALIZATION;  Surgeon: Osborne Oman, MD;  Location: [facility];  Service: Gynecology;  Laterality: N/A;   Family History  Problem Relation Age of Onset  . Hypertension Mother   . Hypertension Father   . Hypertension Maternal Aunt   . Hypertension Maternal Uncle    Social History  Substance Use Topics  . Smoking status: Never Smoker   . Smokeless tobacco: Never Used  . Alcohol Use: Yes     Comment: 03/06/12 "special occasions"   OB History    Gravida Para Term Preterm AB TAB SAB Ectopic Multiple Living   2 2 2   0 0 0  1 3     Review of Systems  Constitutional: Positive for fever and chills.  Respiratory: Positive for cough.   Musculoskeletal: Positive for myalgias.  All other systems reviewed and are negative.     Allergies  Review of patient's allergies  indicates no known allergies.  Home Medications   Prior to Admission medications   Medication Sig Start Date End Date Taking? Authorizing Provider  albuterol (PROVENTIL HFA;VENTOLIN HFA) 108 (90 BASE) MCG/ACT inhaler Inhale 2 puffs into the lungs every 4 (four) hours as needed for wheezing or shortness of breath. 07/30/15   Noemi Chapel, MD  amLODipine (NORVASC) 10 MG tablet TAKE 1 TABLET BY MOUTH EVERY DAY 04/26/15   Rosemarie Ax, MD  amLODipine (NORVASC) 10 MG tablet TAKE ONE TABLET BY MOUTH ONCE DAILY 07/06/15   Rosemarie Ax, MD  Azelastine-Fluticasone [phone] MCG/ACT SUSP Place 1 spray into the nose 2 (two) times daily. Patient not taking: Reported on 03/06/2015 01/15/15   Tamela Oddi Hess, DO  azithromycin (ZITHROMAX Z-PAK) 250 MG tablet Take 1 tablet (250 mg total) by mouth daily. 500mg  PO day 1, then 250mg  PO days 205 07/30/15   Noemi Chapel, MD  chlorpheniramine-HYDROcodone [facility]) 10-8 MG/5ML SUER Take 5 mLs by mouth every 12 (twelve) hours as needed for cough. 03/06/15   Glendell Docker, NP  docusate sodium (COLACE) 100 MG capsule Take 1 capsule (100 mg total) by mouth 2 (two) times daily as needed. Patient not taking: Reported on 03/06/2015 08/29/14   Osborne Oman, MD  ibuprofen (ADVIL,MOTRIN) 600 MG tablet Take 1 tablet (600 mg total) by mouth every 6 (six) hours  as needed for headache, mild pain or moderate pain. 08/29/14   Osborne Oman, MD  lisinopril-hydrochlorothiazide (PRINZIDE,ZESTORETIC) 20-25 MG per tablet Take 2 tablets by mouth daily. 01/15/15   Nolon Rod, DO  Multiple Vitamin (MULTIVITAMIN WITH MINERALS) TABS tablet Take 1 tablet by mouth daily.    Historical Provider, MD  predniSONE (DELTASONE) 20 MG tablet Take 2 tablets (40 mg total) by mouth daily. 07/30/15   Noemi Chapel, MD   BP 145/95 mmHg  Pulse 95  Temp(Src) 99.8 F (37.7 C) (Oral)  Resp 20  SpO2 98%  LMP 07/16/2015 Physical Exam  Constitutional: She is oriented to person, place, and  time. She appears well-developed and well-nourished. No distress.  HENT:  Head: Normocephalic and atraumatic.  Mouth/Throat: Oropharynx is clear and moist. No oropharyngeal exudate.  Eyes: Conjunctivae and EOM are normal. Pupils are equal, round, and reactive to light. Right eye exhibits no discharge. Left eye exhibits no discharge. No scleral icterus.  Neck: Normal range of motion. Neck supple. No JVD present. No tracheal deviation present. No thyromegaly present.  Cardiovascular: Normal rate, regular rhythm, normal heart sounds and intact distal pulses.  Exam reveals no gallop and no friction rub.   No murmur heard. Pulmonary/Chest: Effort normal. No respiratory distress. She has wheezes ( Mild expiratory wheezing in all lung fields on expiration, no distress, speaks in full sentences, frequent coughing spells). She has rales ( Intermittent scattered rales on the left, clear with coughing).  Abdominal: Soft. Bowel sounds are normal. She exhibits no distension and no mass. There is no tenderness.  Musculoskeletal: Normal range of motion. She exhibits no edema or tenderness.  Lymphadenopathy:    She has no cervical adenopathy.  Neurological: She is alert and oriented to person, place, and time. Coordination normal.  Skin: Skin is warm and dry. No rash noted. No erythema.  Psychiatric: She has a normal mood and affect. Her behavior is normal.  Nursing note and vitals reviewed.   ED Course  Procedures (including critical care time)  DIAGNOSTIC STUDIES: Oxygen Saturation is 98% on RA, normal by my interpretation.    COORDINATION OF CARE:  2:46 PM Discussed suspicion of pneumonia based on CXR.  Patient acknowledges and agrees with plan.  Return precautions advised.    Labs Review Labs Reviewed  CBC - Abnormal; Notable for the following:    RBC 5.15 (*)    Hemoglobin 9.4 (*)    HCT 33.3 (*)    MCV 64.7 (*)    MCH 18.3 (*)    MCHC 28.2 (*)    RDW 19.3 (*)    All other components  within normal limits  BASIC METABOLIC PANEL  I-STAT TROPOININ, ED    Imaging Review Dg Chest 2 View  07/30/2015   CLINICAL DATA:  Shortness of breath.  EXAM: CHEST  2 VIEW  COMPARISON:  Mar 06, 2015.  FINDINGS: The heart size and mediastinal contours are within normal limits. No pneumothorax or pleural effusion is noted. Right lung is clear. Increased left perihilar interstitial opacity is noted most consistent with pneumonia. The visualized skeletal structures are unremarkable.  IMPRESSION: Left perihilar interstitial opacity most consistent with pneumonia. Follow-up radiographs are recommended to ensure resolution.   Electronically Signed   By: Marijo Conception, M.D.   On: 07/30/2015 14:49   I have personally reviewed and evaluated these images and lab results as part of my medical decision-making.   EKG Interpretation   Date/Time:  Thursday July 30 2015 14:14:47 EDT  Ventricular Rate:  96 PR Interval:  126 QRS Duration: 68 QT Interval:  347 QTC Calculation: 438 R Axis:   75 Text Interpretation:  Sinus rhythm Nonspecific T wave abnormality Abnormal  ekg Since last tracing Nonspecific T wave abnormality NOW PRESENT  Confirmed by Sabra Heck  MD, Faheem Ziemann ([id number]) on 07/30/2015 2:22:39 PM      MDM   Final diagnoses:  CAP (community acquired pneumonia)    The patient is well-appearing, vital signs are unremarkable, CBC shows no leukocytosis, chest x-ray does show pneumonia, I have personally seen and viewed and interpreted these x-rays and I agree with the radiologist. The patient was informed of all of these results and will be started on antibiotics therapy, refill albuterol, she will receive albuterol nebulized treatment prior to discharge. She is in total agreement with the plan.  Reviewed prior labs - no new aneemia - seems chronic  Meds given in ED:  Medications  albuterol (PROVENTIL) (2.5 MG/3ML) 0.083% nebulizer solution 5 mg (not administered)  albuterol (PROVENTIL) (2.5  MG/3ML) 0.083% nebulizer solution 5 mg (not administered)  cefTRIAXone (ROCEPHIN) injection 1 g (not administered)  lidocaine (PF) (XYLOCAINE) 1 % injection 2.1 mL (not administered)    New Prescriptions   ALBUTEROL (PROVENTIL HFA;VENTOLIN HFA) 108 (90 BASE) MCG/ACT INHALER    Inhale 2 puffs into the lungs every 4 (four) hours as needed for wheezing or shortness of breath.   AZITHROMYCIN (ZITHROMAX Z-PAK) 250 MG TABLET    Take 1 tablet (250 mg total) by mouth daily. 500mg  PO day 1, then 250mg  PO days 205   PREDNISONE (DELTASONE) 20 MG TABLET    Take 2 tablets (40 mg total) by mouth daily.    I, Noemi Chapel D, personally performed the services described in this documentation. All medical record entries made by the scribe were at my direction and in my presence.  I have reviewed the chart and discharge instructions and agree that the record reflects my personal performance and is accurate and complete. Dinh Ayotte D.  07/30/2015. 3:01 PM.         Noemi Chapel, MD 07/30/15 1501

## 2016-06-05 ENCOUNTER — Other Ambulatory Visit: Payer: Self-pay | Admitting: Family Medicine

## 2016-06-06 NOTE — Telephone Encounter (Signed)
This is your patient, see med refill 

## 2016-06-06 NOTE — Telephone Encounter (Signed)
Patient has not been seen in our clinic since March 2016. Will provide 30 day supply of requested Lisinopril-HCTZ, but she will need to schedule an appointment to be seen for blood pressure ASAP in order to receive future refills. Please inform patient. Thank you.   Smitty Cords, MD [place], PGY-2

## 2016-06-06 NOTE — Telephone Encounter (Signed)
Spoke to pt. Informed her that a 30 day supply was called in but will need to make an appt to get further refills. Pt understood, said she would call to schedule appt. She rushed me off the phone, I didn't have a chance to schedule the appt for her. Ottis Stain, CMA

## 2017-03-22 ENCOUNTER — Telehealth: Payer: Self-pay | Admitting: Family Medicine

## 2017-03-22 NOTE — Telephone Encounter (Signed)
Patient has not been seen to follow-up on hypertension since 2016, and has not yet had a visit with current PCP. Patient stated that she no longer has insurance coverage. I provided information for [employer] and [employer].

## 2017-08-03 NOTE — Progress Notes (Deleted)
Subjective:    Patient ID: Jillian Vasquez , female   DOB: [birth year] , 41 y.o..   MRN: [id number]  HPI  Jillian Vasquez is here for No chief complaint on file.   1. Hypertension Blood pressure at home: *** Exercise: *** Low salt diet: *** Medications: amlodipine and HCTZ-lisinopril. Compliant with *** Side effects: *** ROS: Denies headache, dizziness, visual changes, nausea, vomiting, chest pain, abdominal pain or shortness of breath. BP Readings from Last 3 Encounters:  07/30/15 126/81  03/06/15 137/86  01/15/15 (!) 141/95     Review of Systems: Per HPI. All other systems reviewed and are negative.  Health Maintenance Due  Topic Date Due  . HIV Screening  12/01/1991  . PAP SMEAR  12/12/2010  . TETANUS/TDAP  11/24/2012  . INFLUENZA VACCINE  05/24/2017    Past Medical History: Patient Active Problem List   Diagnosis Date Noted  . 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 Wheezing 06/27/2014  . Wheezing on expiration 06/20/2014  . Acute sinusitis 03/07/2014  . Shortness of breath 01/03/2013  . Left hand weakness 03/30/2012  . Anemia 03/15/2012  . Hypokalemia 03/07/2012  . Overweight(278.02) 05/21/2009  . HYPERTENSION, BENIGN SYSTEMIC 12/21/2006    Medications: reviewed and updated Current Outpatient Prescriptions  Medication Sig Dispense Refill  . albuterol (PROVENTIL HFA;VENTOLIN HFA) 108 (90 BASE) MCG/ACT inhaler Inhale 2 puffs into the lungs every 4 (four) hours as needed for wheezing or shortness of breath. 1 Inhaler 3  . amLODipine (NORVASC) 10 MG tablet TAKE 1 TABLET BY MOUTH EVERY DAY 30 tablet 0  . amLODipine (NORVASC) 10 MG tablet TAKE ONE TABLET BY MOUTH ONCE DAILY 30 tablet 3  . Azelastine-Fluticasone 137-50 MCG/ACT SUSP Place 1 spray into the nose 2 (two) times daily. (Patient not taking: Reported on 03/06/2015) 23 g 13  . azithromycin (ZITHROMAX Z-PAK) 250 MG tablet Take 1 tablet (250 mg total) by mouth daily.  500mg  PO day 1, then 250mg  PO days 205 6 tablet 0  . chlorpheniramine-HYDROcodone (TUSSIONEX PENNKINETIC ER) 10-8 MG/5ML SUER Take 5 mLs by mouth every 12 (twelve) hours as needed for cough. 140 mL 0  . docusate sodium (COLACE) 100 MG capsule Take 1 capsule (100 mg total) by mouth 2 (two) times daily as needed. (Patient not taking: Reported on 03/06/2015) 30 capsule 2  . ibuprofen (ADVIL,MOTRIN) 600 MG tablet Take 1 tablet (600 mg total) by mouth every 6 (six) hours as needed for headache, mild pain or moderate pain. 30 tablet 3  . lisinopril-hydrochlorothiazide (PRINZIDE,ZESTORETIC) 20-25 MG tablet TAKE 2 TABLETS BY MOUTH DAILY 60 tablet 0  . Multiple Vitamin (MULTIVITAMIN WITH MINERALS) TABS tablet Take 1 tablet by mouth daily.    . predniSONE (DELTASONE) 20 MG tablet Take 2 tablets (40 mg total) by mouth daily. 10 tablet 0   No current facility-administered medications for this visit.     Social Hx:  reports that she has never smoked. She has never used smokeless tobacco.   Objective:   There were no vitals taken for this visit. Physical Exam  Gen: NAD, alert, cooperative with exam, well-appearing HEENT: NCAT, PERRL, clear conjunctiva, oropharynx clear, supple neck Cardiac: Regular rate and rhythm, normal S1/S2, no murmur, no edema, capillary refill brisk  Respiratory: Clear to auscultation bilaterally, no wheezes, non-labored breathing Gastrointestinal: soft, non tender, non distended, bowel sounds present Skin: no rashes, normal turgor  Neurological: no gross deficits.  Psych: good insight, normal mood and affect  Assessment &  Plan:  No problem-specific Assessment & Plan notes found for this encounter.  No orders of the defined types were placed in this encounter.  No orders of the defined types were placed in this encounter.   Smitty Cords, MD [place], PGY-3

## 2017-08-04 ENCOUNTER — Ambulatory Visit: Payer: Self-pay | Admitting: Family Medicine

## 2017-08-17 NOTE — Progress Notes (Deleted)
Subjective:    Patient ID: Jillian Vasquez , female   DOB: [birth year] , 41 y.o..   MRN: [id number]  HPI  Jillian Vasquez is a 41 yo F with PMH of HTN,and anemia here for No chief complaint on file.   1. Hypertension Has not been seen since 2016 for HTN Blood pressure at home: *** Exercise: *** Low salt diet: *** Medications: Compliant with *** Side effects: *** ROS: Denies headache, dizziness, visual changes, nausea, vomiting, chest pain, abdominal pain or shortness of breath. BP Readings from Last 3 Encounters:  07/30/15 126/81  03/06/15 137/86  01/15/15 (!) 141/95     Review of Systems: Per HPI. All other systems reviewed and are negative.  Health Maintenance Due  Topic Date Due  . HIV Screening  12/01/1991  . PAP SMEAR  12/12/2010  . TETANUS/TDAP  11/24/2012  . INFLUENZA VACCINE  05/24/2017    Past Medical History: Patient Active Problem List   Diagnosis Date Noted  . 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 Wheezing 06/27/2014  . Wheezing on expiration 06/20/2014  . Acute sinusitis 03/07/2014  . Shortness of breath 01/03/2013  . Left hand weakness 03/30/2012  . Anemia 03/15/2012  . Hypokalemia 03/07/2012  . Overweight(278.02) 05/21/2009  . HYPERTENSION, BENIGN SYSTEMIC 12/21/2006    Medications: reviewed and updated Current Outpatient Prescriptions  Medication Sig Dispense Refill  . albuterol (PROVENTIL HFA;VENTOLIN HFA) 108 (90 BASE) MCG/ACT inhaler Inhale 2 puffs into the lungs every 4 (four) hours as needed for wheezing or shortness of breath. 1 Inhaler 3  . amLODipine (NORVASC) 10 MG tablet TAKE 1 TABLET BY MOUTH EVERY DAY 30 tablet 0  . amLODipine (NORVASC) 10 MG tablet TAKE ONE TABLET BY MOUTH ONCE DAILY 30 tablet 3  . Azelastine-Fluticasone 137-50 MCG/ACT SUSP Place 1 spray into the nose 2 (two) times daily. (Patient not taking: Reported on 03/06/2015) 23 g 13  . azithromycin (ZITHROMAX Z-PAK) 250 MG tablet Take  1 tablet (250 mg total) by mouth daily. 500mg  PO day 1, then 250mg  PO days 205 6 tablet 0  . chlorpheniramine-HYDROcodone (TUSSIONEX PENNKINETIC ER) 10-8 MG/5ML SUER Take 5 mLs by mouth every 12 (twelve) hours as needed for cough. 140 mL 0  . docusate sodium (COLACE) 100 MG capsule Take 1 capsule (100 mg total) by mouth 2 (two) times daily as needed. (Patient not taking: Reported on 03/06/2015) 30 capsule 2  . ibuprofen (ADVIL,MOTRIN) 600 MG tablet Take 1 tablet (600 mg total) by mouth every 6 (six) hours as needed for headache, mild pain or moderate pain. 30 tablet 3  . lisinopril-hydrochlorothiazide (PRINZIDE,ZESTORETIC) 20-25 MG tablet TAKE 2 TABLETS BY MOUTH DAILY 60 tablet 0  . Multiple Vitamin (MULTIVITAMIN WITH MINERALS) TABS tablet Take 1 tablet by mouth daily.    . predniSONE (DELTASONE) 20 MG tablet Take 2 tablets (40 mg total) by mouth daily. 10 tablet 0   No current facility-administered medications for this visit.     Social Hx:  reports that she has never smoked. She has never used smokeless tobacco.   Objective:   There were no vitals taken for this visit. Physical Exam  Gen: NAD, alert, cooperative with exam, well-appearing HEENT: NCAT, PERRL, clear conjunctiva, oropharynx clear, supple neck Cardiac: Regular rate and rhythm, normal S1/S2, no murmur, no edema, capillary refill brisk  Respiratory: Clear to auscultation bilaterally, no wheezes, non-labored breathing Gastrointestinal: soft, non tender, non distended, bowel sounds present Skin: no rashes, normal turgor  Neurological:  no gross deficits.  Psych: good insight, normal mood and affect  Assessment & Plan:  No problem-specific Assessment & Plan notes found for this encounter.  No orders of the defined types were placed in this encounter.  No orders of the defined types were placed in this encounter.   Smitty Cords, MD [place], PGY-3

## 2017-08-18 ENCOUNTER — Ambulatory Visit: Payer: Self-pay | Admitting: Family Medicine

## 2017-08-29 ENCOUNTER — Emergency Department (HOSPITAL_COMMUNITY)
Admission: EM | Admit: 2017-08-29 | Discharge: 2017-08-29 | Disposition: A | Discharge status: Home / Self Care | Payer: Self-pay | Attending: Emergency Medicine | Admitting: Emergency Medicine

## 2017-08-29 ENCOUNTER — Encounter (HOSPITAL_COMMUNITY): Payer: Self-pay | Admitting: Emergency Medicine

## 2017-08-29 ENCOUNTER — Emergency Department (HOSPITAL_COMMUNITY): Payer: Self-pay

## 2017-08-29 DIAGNOSIS — Z79899 Other long term (current) drug therapy: Secondary | ICD-10-CM | POA: Insufficient documentation

## 2017-08-29 DIAGNOSIS — N6321 Unspecified lump in the left breast, upper outer quadrant: Secondary | ICD-10-CM | POA: Insufficient documentation

## 2017-08-29 DIAGNOSIS — R7989 Other specified abnormal findings of blood chemistry: Secondary | ICD-10-CM | POA: Insufficient documentation

## 2017-08-29 DIAGNOSIS — R0789 Other chest pain: Secondary | ICD-10-CM | POA: Insufficient documentation

## 2017-08-29 DIAGNOSIS — N63 Unspecified lump in unspecified breast: Secondary | ICD-10-CM

## 2017-08-29 DIAGNOSIS — I10 Essential (primary) hypertension: Secondary | ICD-10-CM | POA: Insufficient documentation

## 2017-08-29 DIAGNOSIS — D649 Anemia, unspecified: Secondary | ICD-10-CM | POA: Insufficient documentation

## 2017-08-29 DIAGNOSIS — J45909 Unspecified asthma, uncomplicated: Secondary | ICD-10-CM | POA: Insufficient documentation

## 2017-08-29 LAB — BASIC METABOLIC PANEL
Anion gap: 8 (ref 5–15)
BUN: 13 mg/dL (ref 6–20)
CALCIUM: 8.9 mg/dL (ref 8.9–10.3)
CO2: 25 mmol/L (ref 22–32)
Chloride: 102 mmol/L (ref 101–111)
Creatinine, Ser: 1.09 mg/dL — ABNORMAL HIGH (ref 0.44–1.00)
GFR calc Af Amer: >60 mL/min (ref >60)
Glucose, Bld: 105 mg/dL — ABNORMAL HIGH (ref 65–99)
Potassium: 3.6 mmol/L (ref 3.5–5.1)
Sodium: 135 mmol/L (ref 135–145)

## 2017-08-29 LAB — CBC
HCT: 33.1 % — ABNORMAL LOW (ref 36.0–46.0)
HEMOGLOBIN: 9.3 g/dL — AB (ref 12.0–15.0)
MCH: 17.7 pg — ABNORMAL LOW (ref 26.0–34.0)
MCHC: 28.1 g/dL — ABNORMAL LOW (ref 30.0–36.0)
MCV: 63 fL — ABNORMAL LOW (ref 78.0–100.0)
Platelets: 281 10*3/uL (ref 150–400)
RBC: 5.25 MIL/uL — ABNORMAL HIGH (ref 3.87–5.11)
RDW: 20.6 % — AB (ref 11.5–15.5)
WBC: 7 10*3/uL (ref 4.0–10.5)

## 2017-08-29 LAB — I-STAT TROPONIN, ED: TROPONIN I, POC: 0 ng/mL (ref 0.00–0.08)

## 2017-08-29 MED ORDER — CYCLOBENZAPRINE HCL 10 MG PO TABS: 10.0000 mg | ORAL_TABLET | Freq: Two times a day (BID) | ORAL | 0 refills | Status: DC | PRN

## 2017-08-29 MED ORDER — KETOROLAC TROMETHAMINE 30 MG/ML IJ SOLN
30.0000 mg | Freq: Once | INTRAMUSCULAR | Status: AC
  Administered 2017-08-29: 30 mg via INTRAMUSCULAR
  Filled 2017-08-29: qty 1

## 2017-08-29 MED ORDER — KETOROLAC TROMETHAMINE 15 MG/ML IJ SOLN
15.0000 mg | Freq: Once | INTRAMUSCULAR | Status: DC
Start: 2017-08-29 — End: 2017-08-29
  Filled 2017-08-29: qty 1

## 2017-08-29 NOTE — ED Notes (Signed)
Pt state she understands instructons. Home stable with family with steady gait.

## 2017-08-29 NOTE — ED Triage Notes (Signed)
Pt states for the last week she has had nasal congestion and cough but last night while laying down she felt a "pop and a pull" in the left side of her chest and rib area and since has been having a pressure type pain in the left side of her chest.

## 2017-08-29 NOTE — ED Provider Notes (Signed)
[facility] Provider Note   CSN: [id number] Arrival date & time: 08/29/17  1740     History   Chief Complaint Chief Complaint  Patient presents with  . Chest Pain  . Nasal Congestion    HPI Jillian Vasquez is a 41 y.o. female.  HPI   Patient is a 41 year old female with a history of asthma, hypertension, and recurrent headaches presenting for a discomfort pressure of her left chest and a "popping" sensation of her left chest since last night.  Patient reports that she feels that it is pulling on the left side of her chest and feels "heavy".  Patient denies any radiation to the jaw, dizziness, lightheadedness, or nausea sensation when this occurred.  Patient was sitting on her couch when it happened.  It is not worse with activity.  No associated injury to chest, neck, or back, or recent heavy lifting. Patient has also had recurrent sinus infections and reports that she recently got off antibiotics for this infection.  She continues to have some congestion.  Patient denies any fever, chills, myalgias, shortness of breath, nausea, vomiting.  Patient reports that her mother had a possible MI versus dissection at the age of 45.  Patient denies any use of estrogen, recent immobilizations, recent hospitalizations, unilateral leg swelling, palpitations.   Additionally, patient is concerned about an area of left breast tenderness.  Patient reports that she feels a nodule, but no erythema or heat of the left breast.  She would like it evaluated.  Blood pressure noted to be significantly elevated last night and today, but began resolving upon transfer to ED room.  Past Medical History:  Diagnosis Date  . Asthma   . Headache disorder    "from my BP"  . Hypertension   . Hypertensive emergency without congestive heart failure 03/06/2012  . Sinus headache     Patient Active Problem List   Diagnosis Date Noted  . Bartholin's gland abscess s/p  marsupialization on 08/29/14 08/29/2014  . Wheezing 06/27/2014  . Wheezing on expiration 06/20/2014  . Acute sinusitis 03/07/2014  . Shortness of breath 01/03/2013  . Left hand weakness 03/30/2012  . Anemia 03/15/2012  . Hypokalemia 03/07/2012  . Overweight(278.02) 05/21/2009  . HYPERTENSION, BENIGN SYSTEMIC 12/21/2006    Past Surgical History:  Procedure Laterality Date  . CESAREAN SECTION  2000  . TUBAL LIGATION  2000    OB History    Gravida Para Term Preterm AB Living   2 2 2    0 3   SAB TAB Ectopic Multiple Live Births   0 0   1         Home Medications    Prior to Admission medications   Medication Sig Start Date End Date Taking? Authorizing Provider  albuterol (PROVENTIL HFA;VENTOLIN HFA) 108 (90 BASE) MCG/ACT inhaler Inhale 2 puffs into the lungs every 4 (four) hours as needed for wheezing or shortness of breath. 07/30/15  Yes Noemi Chapel, MD  amLODipine (NORVASC) 10 MG tablet TAKE ONE TABLET BY MOUTH ONCE DAILY 07/06/15  Yes Rosemarie Ax, MD  Azelastine-Fluticasone 137-50 MCG/ACT SUSP Place 1 spray into the nose 2 (two) times daily. 01/15/15  Yes Hess, Tamela Oddi, DO  ibuprofen (ADVIL,MOTRIN) 600 MG tablet Take 1 tablet (600 mg total) by mouth every 6 (six) hours as needed for headache, mild pain or moderate pain. 08/29/14  Yes Anyanwu, Sallyanne Havers, MD  lisinopril-hydrochlorothiazide (PRINZIDE,ZESTORETIC) 20-25 MG tablet TAKE 2 TABLETS BY MOUTH DAILY  06/06/16  Yes Carlyle Dolly, MD  amLODipine (NORVASC) 10 MG tablet TAKE 1 TABLET BY MOUTH EVERY DAY Patient not taking: Reported on 08/29/2017 04/26/15   Rosemarie Ax, MD  azithromycin (ZITHROMAX Z-PAK) 250 MG tablet Take 1 tablet (250 mg total) by mouth daily. 500mg  PO day 1, then 250mg  PO days 205 Patient not taking: Reported on 08/29/2017 07/30/15   Noemi Chapel, MD  chlorpheniramine-HYDROcodone [facility]) 10-8 MG/5ML SUER Take 5 mLs by mouth every 12 (twelve) hours as needed for cough. Patient not  taking: Reported on 08/29/2017 03/06/15   Glendell Docker, NP  cyclobenzaprine (FLEXERIL) 10 MG tablet Take 1 tablet (10 mg total) 2 (two) times daily as needed by mouth for muscle spasms. 08/29/17   Langston Masker B, PA-C  docusate sodium (COLACE) 100 MG capsule Take 1 capsule (100 mg total) by mouth 2 (two) times daily as needed. Patient not taking: Reported on 03/06/2015 08/29/14   Osborne Oman, MD  Multiple Vitamin (MULTIVITAMIN WITH MINERALS) TABS tablet Take 1 tablet by mouth daily.    [provider]  predniSONE (DELTASONE) 20 MG tablet Take 2 tablets (40 mg total) by mouth daily. Patient not taking: Reported on 08/29/2017 07/30/15   Noemi Chapel, MD    Family History Family History  Problem Relation Age of Onset  . Hypertension Mother   . Hypertension Father   . Hypertension Maternal Aunt   . Hypertension Maternal Uncle     Social History Social History   Tobacco Use  . Smoking status: Never Smoker  . Smokeless tobacco: Never Used  Substance Use Topics  . Alcohol use: Yes    Comment: 03/06/12 "special occasions"  . Drug use: No     Allergies   Patient has no known allergies.   Review of Systems Review of Systems  Constitutional: Negative for chills and fever.  HENT: Negative for congestion, rhinorrhea, sinus pain and sore throat.   Eyes: Negative for visual disturbance.  Respiratory: Positive for chest tightness. Negative for cough and shortness of breath.   Cardiovascular: Negative for palpitations and leg swelling.  Gastrointestinal: Negative for abdominal pain, constipation, nausea and vomiting.  Genitourinary: Negative for dysuria and flank pain.  Musculoskeletal: Negative for back pain and myalgias.  Skin: Negative for rash.  Neurological: Positive for headaches. Negative for dizziness and light-headedness.     Physical Exam Updated Vital Signs BP (!) 144/74   Pulse 91   Temp 98.4 F (36.9 C) (Oral)   Resp 18   Ht 5\' 1"  (1.549 m)   Wt  72.6 kg (160 lb)   SpO2 95%   BMI 30.23 kg/m   Physical Exam  Constitutional: She appears well-developed and well-nourished. No distress.  HENT:  Head: Normocephalic and atraumatic.  Mouth/Throat: Oropharynx is clear and moist.  Purulent nasal secretions noted in b/l nares.  Eyes: Conjunctivae and EOM are normal. Pupils are equal, round, and reactive to light.  Neck: Normal range of motion. Neck supple.  Cardiovascular: Normal rate, regular rhythm, S1 normal and S2 normal.  No murmur heard. Pulmonary/Chest: Effort normal and breath sounds normal. She has no wheezes. She has no rales.  Abdominal: Soft. She exhibits no distension. There is no tenderness. There is no guarding.  Musculoskeletal:  Point tenderness to palpation in b/l trapezius muscles.  Lymphadenopathy:    She has no cervical adenopathy.  Neurological: She is alert.  Cranial nerves grossly intact. PALPATION: No midline but paraspinal musculature tenderness of cervical and thoracic spine.  ROM of cervical spine intact with flexion/extension/lateral flexion/lateral rotation; Patient can laterally rotate cervical spine greater than 45 degrees.  MOTOR: 5/5 strength b/l with resisted shoulder abduction/adduction, biceps flexion (C5/6), biceps extension (C6-C8), wrist flexion, wrist extension (C6-C8), and grip strength (C7-T1) 2+ DTRs in the biceps SENSORY: Sensation is intact to light touch in:  Superficial radial nerve distribution (dorsal first web space) Median nerve distribution (tip of index finger)   Ulnar nerve distribution (tip of small finger)   Skin: Skin is warm and dry. No rash noted. No erythema.  Breast exam performed with nurse chaperone present.  Patient exhibits a mobile approximately 1 cm in diameter nodule approximately the 2 o'clock position of the left breast.  Patient reports this is the area of tenderness.  There is no surrounding fluctuance, erythema.  Psychiatric: She has a normal mood and affect. Her  behavior is normal. Judgment and thought content normal.  Nursing note and vitals reviewed.    ED Treatments / Results  Labs (all labs ordered are listed, but only abnormal results are displayed) Labs Reviewed  BASIC METABOLIC PANEL - Abnormal; Notable for the following components:      Result Value   Glucose, Bld 105 (*)    Creatinine, Ser 1.09 (*)    All other components within normal limits  CBC - Abnormal; Notable for the following components:   RBC 5.25 (*)    Hemoglobin 9.3 (*)    HCT 33.1 (*)    MCV 63.0 (*)    MCH 17.7 (*)    MCHC 28.1 (*)    RDW 20.6 (*)    All other components within normal limits  I-STAT TROPONIN, ED    EKG  EKG Interpretation  Date/Time:  Tuesday August 29 2017 08:49:12 EST Ventricular Rate:  69 PR Interval:  134 QRS Duration: 78 QT Interval:  410 QTC Calculation: 439 R Axis:   76 Text Interpretation:  Normal sinus rhythm Nonspecific T wave abnormality Abnormal ECG No significant change since last tracing Confirmed by Deno Etienne [phone]) on 08/29/2017 12:05:08 PM       Radiology Dg Chest 2 View  Result Date: 08/29/2017 CLINICAL DATA:  Cough and chest pain EXAM: CHEST  2 VIEW COMPARISON:  July 30, 2015 FINDINGS: Lungs are clear. Heart size and pulmonary vascularity are normal. No adenopathy. No pneumothorax. No bone lesions. IMPRESSION: No edema or consolidation. Electronically Signed   By: Lowella Grip III M.D.   On: 08/29/2017 09:38    Procedures Procedures (including critical care time)  Medications Ordered in ED Medications  ketorolac (TORADOL) 30 MG/ML injection 30 mg (30 mg Intramuscular Given 08/29/17 1405)     Initial Impression / Assessment and Plan / ED Course  I have reviewed the triage vital signs and the nursing notes.  Pertinent labs & imaging results that were available during my care of the patient were reviewed by me and considered in my medical decision making (see chart for details).  Clinical Course as  of Aug 29 2312  Tue Aug 29, 2017  1411 Patient reevaluated.  Patient is feeling better and blood pressure is improving.  Will give Toradol for symptoms.  [AM]    Clinical Course User Index [AM] Albesa Seen, PA-C    Final Clinical Impressions(s) / ED Diagnoses   Final diagnoses:  Chest pressure  Anemia, unspecified type  Elevated serum creatinine  Breast nodule   Differential diagnosis includes ACS, PE,esophageal spasm, gastroesophageal reflux, herpes zoster of the thorax, pericarditis, pneumonia,  chest wall pain, trapezius muscle strain.  Doubt ACS, as chest pain story is only slightly suspicious, patient is presenting 12 hours after initial presentation, an dtroponin is negative, initial and repeat EKGs show no signs of ischemia, infarction, or arrhythmia, and HEART score 1 (hypertension, possible MI versus dissection in her mother at age of 21). Doubt PE as Well's score 0 and PERC negative, and patient not tachycardic. Doubt TAD by hx, CXR showed no widening mediastinum and no radiation of the pain into the back. Patient remained nontoxic appearing and in no acute distress during emergency department course. Vital signs demonstrate hypertension, which began resolving spontaneously in the emergency department. Patient took antihypertensives this AM. Pericarditis less likely due to remote sinusitis leading up to presentation, symptoms linked to a sudden onset, and not improved in upright positions. Abnormal labs include hemoglobin 9.3, which is chronic based on chart review.  Patient has slightly elevated creatinine at 1.09 today (prior 0.99 and 0.98) and I discussed repeat testing at PCP office. This may be related to long-standing hypertension. Patient unaware of diagnosis of anemia but I discussed PCP for follow up. Patient is aware of diagnostic uncertainty and was given strict return precautions. Case was discussed with Dr. Deno Etienne who felt that chest discomfort seemed to be more  musculoskeletal in nature.  Additionally, I discussed with patient differential diagnosis of breast lumps and feel that she should follow-up with a primary care provider regarding this for possible ultrasound or mammography.  There is no evidence of mastitis or abscess today.  Patient is in understanding and agrees with plan of care.    ED Discharge Orders        Ordered    cyclobenzaprine (FLEXERIL) 10 MG tablet  2 times daily PRN     08/29/17 1456       Albesa Seen, PA-C 08/29/17 2323    Deno Etienne, DO 08/30/17 1500

## 2017-08-29 NOTE — Discharge Instructions (Signed)
Please see the information and instructions below regarding your visit.  Your diagnoses today include:  1. Chest pressure   2. Anemia, unspecified type   3. Elevated serum creatinine   4. Breast nodule    Your testing and exam is reassuring today that your heart function is normal.  We do not think that the cause of your symptoms is coming from the heart of the lungs.  It is likely in the muscle.  Your blood testing showed that you are slightly anemic today.  We would like this repeated and followed up with your primary doctor.  Additionally, you have slightly elevated kidney numbers.  This can happen in folks with high blood pressure.  I would like this also followed up.  The examination of your breast nodule is reassuring as it is movable, however it should be looked at again by primary care provider who can follow up with ultrasound or mammogram.  Tests performed today include: See side panel of your discharge paperwork for testing performed today. Vital signs are listed at the bottom of these instructions.   Troponin-a smoke signal in the heart Chest xray Electrolytes, kidney function Blood counts-show anemia.  Medications prescribed:    Take any prescribed medications only as prescribed, and any over the counter medications only as directed on the packaging.  You are prescribed Flexeril, a muscle relaxant. Some common side effects of this medication include:  Feeling sleepy.  Dizziness. Take care upon going from a seated to a standing position.  Dry mouth.  Feeling tired or weak.  Hard stools (constipation).  Upset stomach. These are not all of the side effects that may occur. If you have questions about side effects, call your doctor. Call your primary care provider for medical advice about side effects.  This medication can be sedating. Only take this medication as needed. Please do not combine with alcohol. Do not drive or operate machinery while taking this medication.    This medication can interact with some other medications. Make sure to tell any provider you are taking this medication before they prescribe you a new medication.    Home care instructions:  Please follow any educational materials contained in this packet.   Follow-up instructions: Please follow-up with your primary care provider in one week for further evaluation of your symptoms if they are not completely improved.   Return instructions:  Please return to the Emergency Department if you experience worsening symptoms.  Please return to the emergency department for any worsening chest pain, shortness of breath with your symptoms, dizziness or lightheadedness, feeling like you are going to pass out. Please return for any numbness or weakness in the arm affected. Please return if you have any other emergent concerns.  Additional Information:  Your vital signs today were: BP (!) 151/103    Pulse 82    Temp 98.1 F (36.7 C) (Oral)    Resp 15    Ht 5\' 1"  (1.549 m)    Wt 72.6 kg (160 lb)    SpO2 100%    BMI 30.23 kg/m  If your blood pressure (BP) was elevated on multiple readings during this visit above 130 for the top number or above 80 for the bottom number, please have this repeated by your primary care provider within one month. --------------  Thank you for allowing [place] to participate in your care today.

## 2018-11-12 ENCOUNTER — Ambulatory Visit (HOSPITAL_COMMUNITY)
Admission: EM | Admit: 2018-11-12 | Discharge: 2018-11-12 | Disposition: A | Discharge status: Home / Self Care | Payer: No Typology Code available for payment source | Attending: Family Medicine | Admitting: Family Medicine

## 2018-11-12 ENCOUNTER — Encounter (HOSPITAL_COMMUNITY): Payer: Self-pay | Admitting: Emergency Medicine

## 2018-11-12 DIAGNOSIS — I10 Essential (primary) hypertension: Secondary | ICD-10-CM | POA: Diagnosis not present

## 2018-11-12 DIAGNOSIS — J0101 Acute recurrent maxillary sinusitis: Secondary | ICD-10-CM | POA: Diagnosis not present

## 2018-11-12 MED ORDER — PREDNISONE 10 MG (21) PO TBPK: ORAL_TABLET | Freq: Every day | ORAL | 0 refills | Status: DC

## 2018-11-12 MED ORDER — AMLODIPINE BESYLATE 10 MG PO TABS: 10.0000 mg | ORAL_TABLET | Freq: Every day | ORAL | 0 refills | Status: DC

## 2018-11-12 NOTE — Discharge Instructions (Signed)
Your blood pressure was elevated today in clinic. Please be sure to take blood pressure medications as prescribed. Please monitor your blood pressure at home or when you go to a [facility]. Please follow up with your primary care doctor to recheck blood pressure and discuss any need for medication changes.   Please go to Emergency Room if you start to experience severe headache, vision changes, decreased urine production, chest pain, shortness of breath, speech slurring, one sided weakness.  Prednisone taper over next week  Follow up with ENT on Wed

## 2018-11-12 NOTE — ED Triage Notes (Addendum)
Pt here with URI sx and sinus congestion x 3 days pt is currently out of her BP meds

## 2018-11-12 NOTE — ED Provider Notes (Signed)
[facility]    CSN: [id number] Arrival date & time: 11/12/18  2119     History   Chief Complaint Chief Complaint  Patient presents with  . URI    HPI Jillian Vasquez is a 43 y.o. female history of hypertension, asthma, recurrent sinusitis presenting today for evaluation of sinus symptoms and medication refill.  Patient states that she typically goes to the gym at urgent care for her sinus symptoms, and has been referred to ENT.  She had a scan approximately 6 months ago showing sinusitis.  Has plans to follow-up with ENT on Wednesday.  She does not ever have any relief with over-the-counter medicines including Mucinex, Zyrtec, Claritin, Flonase.  She typically only has improved symptoms with prednisone.  Her main complaint is swelling within the nasal passages.  She denies cough.  Symptoms flared up again in December but have worsened over the past few days.  Patient is also out of her blood pressure medicine.  She denies any chest pain, shortness of breath, headache, vision changes, weakness, difficulty speaking, confusion.  HPI  Past Medical History:  Diagnosis Date  . Asthma   . Headache disorder    "from my BP"  . Hypertension   . Hypertensive emergency without congestive heart failure 03/06/2012  . Sinus headache     Patient Active Problem List   Diagnosis Date Noted  . 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 Wheezing 06/27/2014  . Wheezing on expiration 06/20/2014  . Acute sinusitis 03/07/2014  . Shortness of breath 01/03/2013  . Left hand weakness 03/30/2012  . Anemia 03/15/2012  . Hypokalemia 03/07/2012  . Overweight(278.02) 05/21/2009  . HYPERTENSION, BENIGN SYSTEMIC 12/21/2006    Past Surgical History:  Procedure Laterality Date  . BARTHOLIN CYST MARSUPIALIZATION N/A 08/29/2014   Procedure: BARTHOLIN CYST MARSUPIALIZATION;  Surgeon: Osborne Oman, MD;  Location: [facility];  Service: Gynecology;  Laterality: N/A;    . CESAREAN SECTION  2000  . TUBAL LIGATION  2000    OB History    Gravida  2   Para  2   Term  2   Preterm      AB  0   Living  3     SAB  0   TAB  0   Ectopic      Multiple  1   Live Births               Home Medications    Prior to Admission medications   Medication Sig Start Date End Date Taking? Authorizing Provider  albuterol (PROVENTIL HFA;VENTOLIN HFA) 108 (90 BASE) MCG/ACT inhaler Inhale 2 puffs into the lungs every 4 (four) hours as needed for wheezing or shortness of breath. 07/30/15   Noemi Chapel, MD  amLODipine (NORVASC) 10 MG tablet Take 1 tablet (10 mg total) by mouth daily. 11/12/18   Wieters, Hallie C, PA-C  Azelastine-Fluticasone 137-50 MCG/ACT SUSP Place 1 spray into the nose 2 (two) times daily. 01/15/15   Hess, Tamela Oddi, DO  cyclobenzaprine (FLEXERIL) 10 MG tablet Take 1 tablet (10 mg total) 2 (two) times daily as needed by mouth for muscle spasms. 08/29/17   Langston Masker B, PA-C  ibuprofen (ADVIL,MOTRIN) 600 MG tablet Take 1 tablet (600 mg total) by mouth every 6 (six) hours as needed for headache, mild pain or moderate pain. 08/29/14   Osborne Oman, MD  lisinopril-hydrochlorothiazide (PRINZIDE,ZESTORETIC) 20-25 MG tablet TAKE 2 TABLETS BY MOUTH DAILY 06/06/16  Carlyle Dolly, MD  Multiple Vitamin (MULTIVITAMIN WITH MINERALS) TABS tablet Take 1 tablet by mouth daily.    [provider]  predniSONE (STERAPRED UNI-PAK 21 TAB) 10 MG (21) TBPK tablet Take by mouth daily. Begin with 6 tabs today, 5 tomorrow, 4 on day 3, 3 on day 4, 2 on day 5, 1 on day 6 11/12/18   Wieters, [place] C, PA-C    Family History Family History  Problem Relation Age of Onset  . Hypertension Mother   . Hypertension Father   . Hypertension Maternal Aunt   . Hypertension Maternal Uncle     Social History Social History   Tobacco Use  . Smoking status: Never Smoker  . Smokeless tobacco: Never Used  Substance Use Topics  . Alcohol use: Yes     Comment: 03/06/12 "special occasions"  . Drug use: No     Allergies   Patient has no known allergies.   Review of Systems Review of Systems  Constitutional: Negative for activity change, appetite change, chills, fatigue and fever.  HENT: Positive for congestion, rhinorrhea, sinus pressure and sinus pain. Negative for ear pain, sore throat and trouble swallowing.   Eyes: Negative for photophobia, pain, discharge, redness and visual disturbance.  Respiratory: Negative for cough, chest tightness and shortness of breath.   Cardiovascular: Negative for chest pain.  Gastrointestinal: Negative for abdominal pain, diarrhea, nausea and vomiting.  Genitourinary: Negative for decreased urine volume and hematuria.  Musculoskeletal: Negative for myalgias, neck pain and neck stiffness.  Skin: Negative for rash.  Neurological: Negative for dizziness, syncope, facial asymmetry, speech difficulty, weakness, light-headedness, numbness and headaches.     Physical Exam Triage Vital Signs ED Triage Vitals [11/12/18 1024]  Enc Vitals Group     BP (!) 209/129     Pulse Rate 87     Resp 18     Temp 98.9 F (37.2 C)     Temp Source Temporal     SpO2 100 %     Weight      Height      Head Circumference      Peak Flow      Pain Score 7     Pain Loc      Pain Edu?      Excl. in [place]?    No data found.  Updated Vital Signs BP (!) 209/129 (BP Location: Right Arm)   Pulse 87   Temp 98.9 F (37.2 C) (Temporal)   Resp 18   SpO2 100%   Visual Acuity Right Eye Distance:   Left Eye Distance:   Bilateral Distance:    Right Eye Near:   Left Eye Near:    Bilateral Near:     Physical Exam Vitals signs and nursing note reviewed.  Constitutional:      General: She is not in acute distress.    Appearance: She is well-developed.  HENT:     Head: Normocephalic and atraumatic.     Ears:     Comments: Bilateral ears without tenderness to palpation of external auricle, tragus and mastoid, EAC's  without erythema or swelling, TM's with good bony landmarks and cone of light. Non erythematous.     Nose:     Comments: Bilateral nares extremely occluded with significant turbinate swelling, unable to visualize beyond due to swelling    Mouth/Throat:     Comments: Oral mucosa pink and moist, no tonsillar enlargement or exudate. Posterior pharynx patent and nonerythematous, no uvula deviation or swelling.  Normal phonation. Eyes:     Extraocular Movements: Extraocular movements intact.     Conjunctiva/sclera: Conjunctivae normal.     Pupils: Pupils are equal, round, and reactive to light.  Neck:     Musculoskeletal: Neck supple.  Cardiovascular:     Rate and Rhythm: Normal rate and regular rhythm.     Heart sounds: No murmur.  Pulmonary:     Effort: Pulmonary effort is normal. No respiratory distress.     Breath sounds: Normal breath sounds.     Comments: Breathing comfortably at rest, CTABL, no wheezing, rales or other adventitious sounds auscultated Abdominal:     Palpations: Abdomen is soft.     Tenderness: There is no abdominal tenderness.  Skin:    General: Skin is warm and dry.  Neurological:     General: No focal deficit present.     Mental Status: She is alert and oriented to person, place, and time. Mental status is at baseline.     Cranial Nerves: No cranial nerve deficit.      UC Treatments / Results  Labs (all labs ordered are listed, but only abnormal results are displayed) Labs Reviewed - No data to display  EKG None  Radiology No results found.  Procedures Procedures (including critical care time)  Medications Ordered in UC Medications - No data to display  Initial Impression / Assessment and Plan / UC Course  I have reviewed the triage vital signs and the nursing notes.  Pertinent labs & imaging results that were available during my care of the patient were reviewed by me and considered in my medical decision making (see chart for  details).  Clinical Course as of Nov 12 1114  Mon Nov 12, 2018  1103 BP rechecked 195/115   [HW]    Clinical Course User Index [HW] Wieters, Hallie C, PA-C    Blood pressure significantly elevated today, rechecked and was improved.  Will refill blood pressure medicines and will have patient restart.  Patient asymptomatic at this time.  Will have patient continue to monitor her blood pressure and symptoms.  Follow-up with PCP for further management of blood pressure.  We will initiate patient on course of prednisone given significant swelling in nares.  Follow-up with ENT as planned on Wednesday.  Discussed strict return precautions. Patient verbalized understanding and is agreeable with plan.  Final Clinical Impressions(s) / UC Diagnoses   Final diagnoses:  Hypertension, uncontrolled  Acute recurrent maxillary sinusitis     Discharge Instructions     Your blood pressure was elevated today in clinic. Please be sure to take blood pressure medications as prescribed. Please monitor your blood pressure at home or when you go to a [employer]/[facility]ym. Please follow up with your primary care doctor to recheck blood pressure and discuss any need for medication changes.   Please go to Emergency Room if you start to experience severe headache, vision changes, decreased urine production, chest pain, shortness of breath, speech slurring, one sided weakness.  Prednisone taper over next week  Follow up with ENT on Wed    ED Prescriptions    Medication Sig Dispense Auth. Provider   amLODipine (NORVASC) 10 MG tablet Take 1 tablet (10 mg total) by mouth daily. 30 tablet Wieters, Hallie C, PA-C   predniSONE (STERAPRED UNI-PAK 21 TAB) 10 MG (21) TBPK tablet Take by mouth daily. Begin with 6 tabs today, 5 tomorrow, 4 on day 3, 3 on day 4, 2 on day 5, 1 on day 6 21  tablet Wieters, [place] C, PA-C     Controlled Substance Prescriptions [place] Controlled Substance Registry consulted? Not Applicable    Janith Lima, [place] 11/12/18 1120

## 2018-12-14 ENCOUNTER — Ambulatory Visit (INDEPENDENT_AMBULATORY_CARE_PROVIDER_SITE_OTHER): Payer: No Typology Code available for payment source | Admitting: Family Medicine

## 2018-12-14 ENCOUNTER — Encounter: Payer: Self-pay | Admitting: Family Medicine

## 2018-12-14 ENCOUNTER — Other Ambulatory Visit: Payer: Self-pay

## 2018-12-14 VITALS — BP 122/78 | Temp 98.0°F | Wt 177.0 lb

## 2018-12-14 DIAGNOSIS — J320 Chronic maxillary sinusitis: Secondary | ICD-10-CM

## 2018-12-14 DIAGNOSIS — Z1239 Encounter for other screening for malignant neoplasm of breast: Secondary | ICD-10-CM

## 2018-12-14 MED ORDER — PREDNISONE 10 MG (21) PO TBPK: ORAL_TABLET | Freq: Every day | ORAL | 0 refills | Status: DC

## 2018-12-14 NOTE — Progress Notes (Signed)
  Subjective:   Patient ID: Jillian Vasquez    DOB: April 23, 1976, 43 y.o. female   MRN: [id number]  Jillian Vasquez is a 43 y.o. female with a history of HTN, anemia, overweight here for   Chronic sinusitis - Seen by [facility], most recent 11/14/18 for longstanding h/o sinus issues and nasal polyps. Has been on steroids multiple times. Has anosmia. Wants to proceed with surgery for removal of polyps. Last CT scan 12/2017. - Has difficulty proceeding with surgery as insurance will only pay a certain amount. Would have to pay $4000 out of pocket. Office does not have a payment plan and she cannot afford to pay this all at once. - Finished last prednisone course 2 weeks ago and is starting to have difficulties breathing again. Last prednisone course before this was in October. - has tried warm compresses, helps a little. Taking Ibuprofen to help with swelling. - a little blood in snot this am - no ear pain or drainage.  Review of Systems:  Per HPI.  [place], medications and smoking status reviewed.  Objective:   BP 122/78   Temp 98 F (36.7 C) (Oral)   Wt 80.3 kg   LMP 12/13/2018   BMI 33.44 kg/m  Vitals and nursing note reviewed.  General:overweight female, in no acute distress with non-toxic appearance HEENT: normocephalic, atraumatic, moist mucous membranes. R TM bulging without purulence, erythema, or irritation. Partial view of L TM nl. Bilateral nares with visible large polyps and thick snot. Neck: supple, non-tender without lymphadenopathy CV: regular rate and rhythm without murmurs, rubs, or gallops Lungs: clear to auscultation bilaterally with normal work of breathing. Slight end expiratory wheezing. Neuro: Alert and oriented, speech normal  Assessment & Plan:   Sinusitis, chronic Ongoing, currently flared. Will provide additional prednisone course. No need for antibiotics given afebrile, no erythema or purulence noted to ear. Continue with supportive measures.  Placed ENT referral for different office that would (hopefully) have payment plan options.   Orders Placed This Encounter  Procedures  . MM DIGITAL SCREENING BILATERAL    Standing Status:   Future    Standing Expiration Date:   12/15/2019    Order Specific Question:   Reason for Exam (SYMPTOM  OR DIAGNOSIS REQUIRED)    Answer:   screening for breast cancer    Order Specific Question:   Is the patient pregnant?    Answer:   No    Order Specific Question:   Preferred imaging location?    Answer:   [facility]  . Ambulatory referral to ENT    Referral Priority:   Routine    Referral Type:   Consultation    Referral Reason:   Specialty Services Required    Requested Specialty:   Otolaryngology    Number of Visits Requested:   1   Meds ordered this encounter  Medications  . predniSONE (STERAPRED UNI-PAK 21 TAB) 10 MG (21) TBPK tablet    Sig: Take by mouth daily. Begin with 6 tabs today, 5 tomorrow, 4 on day 3, 3 on day 4, 2 on day 5, 1 on day 6    Dispense:  21 tablet    Refill:  0    Rory Percy, DO PGY-2, [facility] 12/15/2018 8:45 AM

## 2018-12-14 NOTE — Patient Instructions (Signed)
It was great to see you!  Our plans for today:  - We are refilling your prednisone pack.  - We referred you to a different ENT. - Make appointment when you check out for a pap smear.  Take care and seek immediate care sooner if you develop any concerns.   Dr. [facility]

## 2018-12-15 ENCOUNTER — Encounter: Payer: Self-pay | Admitting: Family Medicine

## 2018-12-15 NOTE — Assessment & Plan Note (Addendum)
Ongoing, currently flared. Will provide additional prednisone course. No need for antibiotics given afebrile, no erythema or purulence noted to ear. Continue with supportive measures. Placed ENT referral for different office that would (hopefully) have payment plan options.

## 2018-12-27 ENCOUNTER — Other Ambulatory Visit: Payer: Self-pay | Admitting: Family Medicine

## 2018-12-27 ENCOUNTER — Encounter (HOSPITAL_BASED_OUTPATIENT_CLINIC_OR_DEPARTMENT_OTHER): Payer: Self-pay | Admitting: *Deleted

## 2018-12-27 ENCOUNTER — Other Ambulatory Visit: Payer: Self-pay

## 2018-12-27 ENCOUNTER — Telehealth: Payer: Self-pay | Admitting: Family Medicine

## 2018-12-27 DIAGNOSIS — Z1239 Encounter for other screening for malignant neoplasm of breast: Secondary | ICD-10-CM

## 2018-12-27 NOTE — Telephone Encounter (Signed)
New order in chart.   Dorris Singh, MD  Family Medicine Teaching Service

## 2018-12-27 NOTE — Telephone Encounter (Signed)
Called and informed patient that order for mammogram has been placed in chart.  Patient verbalized understanding.  Jillian Vasquez, [place]

## 2018-12-27 NOTE — Telephone Encounter (Signed)
Pt is would like a referral for a regular mammogram. She was scheduled to get a 3D, but the radiologist feels she she get the regular one. They need new orders in so that she can reschedule. jw

## 2018-12-28 ENCOUNTER — Ambulatory Visit: Payer: Self-pay | Admitting: Otolaryngology

## 2018-12-28 ENCOUNTER — Other Ambulatory Visit: Payer: Self-pay | Admitting: Otolaryngology

## 2018-12-28 ENCOUNTER — Other Ambulatory Visit: Payer: Self-pay | Admitting: Family Medicine

## 2018-12-28 DIAGNOSIS — J329 Chronic sinusitis, unspecified: Secondary | ICD-10-CM

## 2018-12-28 DIAGNOSIS — N644 Mastodynia: Secondary | ICD-10-CM

## 2018-12-28 NOTE — H&P (Signed)
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 female who presents for evaluation of sinonasal polyps. Patient has had a previous history of sinonasal polyps and had previously been scheduled for surgery but canceled this. 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 She is taken to the operating room this time for FESS and removal of sinonasal polyps.  Past Medical History:  Diagnosis Date  . Asthma   . Chronic sinusitis of both maxillary sinuses   . Headache disorder    "from my BP"  . Hypertension   . Hypertensive emergency without congestive heart failure 03/06/2012  . Sinus headache    Past Surgical History:  Procedure Laterality Date  . BARTHOLIN CYST MARSUPIALIZATION N/A 08/29/2014   Procedure: BARTHOLIN CYST MARSUPIALIZATION;  Surgeon: Osborne Oman, MD;  Location: [facility];  Service: Gynecology;  Laterality: N/A;  . CESAREAN SECTION  2000  . TUBAL LIGATION  2000   Social History   Socioeconomic History  . Marital status: Single    Spouse name: Not on file  . Number of children: Not on file  . Years of education: Not on file  . Highest education level: Not on file  Occupational History  . Not on file  Social Needs  . Financial resource strain: Not on file  . Food insecurity:    Worry: Not on file    Inability: Not on file  . Transportation needs:    Medical: Not on file    Non-medical: Not on file  Tobacco Use  . Smoking status: Never Smoker  . Smokeless tobacco: Never Used  Substance and Sexual Activity  . Alcohol use: Yes    Comment: 03/06/12 "special occasions"  . Drug use: No  . Sexual activity: Not Currently    Birth control/protection: Surgical  Lifestyle  . Physical activity:    Days per week:  Not on file    Minutes per session: Not on file  . Stress: Not on file  Relationships  . Social connections:    Talks on phone: Not on file    Gets together: Not on file    Attends religious service: Not on file    Active member of club or organization: Not on file    Attends meetings of clubs or organizations: Not on file    Relationship status: Not on file  Other Topics Concern  . Not on file  Social History Narrative  . Not on file   Family History  Problem Relation Age of Onset  . Hypertension Mother   . Hypertension Father   . Hypertension Maternal Aunt   . Hypertension Maternal Uncle    No Known Allergies Prior to Admission medications   Medication Sig Start Date End Date Taking? Authorizing Provider  albuterol (PROVENTIL HFA;VENTOLIN HFA) 108 (90 BASE) MCG/ACT inhaler Inhale 2 puffs into the lungs every 4 (four) hours as needed for wheezing or shortness of breath. 07/30/15  Yes Noemi Chapel, MD  amLODipine (NORVASC) 10 MG tablet Take 1 tablet (10 mg total) by mouth daily. 11/12/18  Yes Wieters, Hallie C, PA-C  cyclobenzaprine (FLEXERIL) 10 MG tablet Take 1 tablet (10 mg total) 2 (two) times daily as needed by mouth for muscle spasms. 08/29/17  Yes Langston Masker B, PA-C  ibuprofen (ADVIL,MOTRIN) 600 MG tablet Take 1 tablet (600 mg total) by mouth every 6 (six) hours as needed for headache, mild pain or moderate pain. 08/29/14  Yes Anyanwu, Sallyanne Havers, MD  lisinopril-hydrochlorothiazide (PRINZIDE,ZESTORETIC) 20-25 MG tablet TAKE 2 TABLETS BY MOUTH DAILY 06/06/16  Yes Carlyle Dolly, MD  Multiple Vitamin (MULTIVITAMIN WITH MINERALS) TABS tablet Take 1 tablet by mouth daily.   Yes [provider]     Positive ROS: intermittent headaches and nasal obstruction per history of present illness.  All other systems have been reviewed and were otherwise negative with the exception of those mentioned in the HPI and as above.  Physical Exam: There were no vitals filed for  this visit.  General: Alert, no acute distress Oral: Normal oral mucosa and tonsils Nasal: Both nasal cavities were completely obstructed with polyps. Neck: No palpable adenopathy or thyroid nodules Ear: Ear canal is clear with normal appearing TMs Cardiovascular: Regular rate and rhythm, no murmur.  Respiratory: Clear to auscultation Neurologic: Alert and oriented x 3   Assessment/Plan: NASAL POLYP, CHRONIC PANSINUSITIS Plan for Procedure(s): TURBINATE REDUCTION ENDOSCOPIC SINUS SURGERY WITH SHENOIDOTOMY ETHMOIDECTOMY MAXILLARY ANTROSTOMY WITH REMOVAL OF TISSUE SINUS ENDO WITH FUSION   [name], MD 12/28/2018 5:29 PM

## 2018-12-28 NOTE — Telephone Encounter (Signed)
Routing to provider who ordered original mammogram.   Per note, the patient had no symptoms of breast cancer. This means she needs a screening mammogram. I may not have all of the information.   Dr. Ky Barban- please clarify if screening exam (no symptoms) or diagnostic (complained of mass etc).   Dorris Singh, MD  Family Medicine Teaching Service

## 2018-12-28 NOTE — Telephone Encounter (Signed)
Pt is calling back to inform us we called in the wrong mammogram. It is supposed to be a diagnostic mammogram can we call and chenge this please. jw

## 2018-12-28 NOTE — Telephone Encounter (Signed)
Should be regular screening mammogram. She was not having symptoms at the time of her visitt.

## 2018-12-31 NOTE — Telephone Encounter (Signed)
Attempted to call patient concerning mammogram order.  Order placed by Dr. Owens Shark ( PCP) was for a screening mammogram.  Patient states that it is wrong.  Need clarification as to exactly what patient needs.  If patient is having issues with breast, she needs to be more specific and in detail so provider will know.  If a diagnostic mammogram is needed then patient should schedule an appointment with provider to be properly accessed.  Patient did not have or speak of issues at last visit per Dr. Ky Barban.  When patient calls back, please ascertain the above information.  Jillian Vasquez, [place]

## 2019-01-01 ENCOUNTER — Ambulatory Visit
Admission: RE | Admit: 2019-01-01 | Discharge: 2019-01-01 | Disposition: A | Discharge status: Home / Self Care | Payer: No Typology Code available for payment source | Source: Ambulatory Visit | Attending: Otolaryngology | Admitting: Otolaryngology

## 2019-01-01 DIAGNOSIS — J329 Chronic sinusitis, unspecified: Secondary | ICD-10-CM

## 2019-01-02 NOTE — Anesthesia Preprocedure Evaluation (Addendum)
Anesthesia Evaluation  Patient identified by MRN, date of birth, ID band Patient awake    Reviewed: Allergy & Precautions, NPO status , Patient's Chart, lab work & pertinent test results  History of Anesthesia Complications Negative for: history of anesthetic complications  Airway Mallampati: II  TM Distance: >3 FB Neck ROM: Full    Dental  (+) Dental Advisory Given, Teeth Intact   Pulmonary asthma ,    breath sounds clear to auscultation       Cardiovascular hypertension, Pt. on medications  Rhythm:Regular Rate:Normal     Neuro/Psych  Headaches, negative psych ROS   GI/Hepatic negative GI ROS, Neg liver ROS,   Endo/Other   Obesity   Renal/GU negative Renal ROS     Musculoskeletal negative musculoskeletal ROS (+)   Abdominal   Peds  Hematology negative hematology ROS (+)   Anesthesia Other Findings   Reproductive/Obstetrics                            Anesthesia Physical Anesthesia Plan  ASA: II  Anesthesia Plan: General   Post-op Pain Management:    Induction: Intravenous  PONV Risk Score and Plan: 3 and Treatment may vary due to age or medical condition, Ondansetron, Dexamethasone, Midazolam and Scopolamine patch - Pre-op  Airway Management Planned: Oral ETT  Additional Equipment: None  Intra-op Plan:   Post-operative Plan: Extubation in OR  Informed Consent: I have reviewed the patients History and Physical, chart, labs and discussed the procedure including the risks, benefits and alternatives for the proposed anesthesia with the patient or authorized representative who has indicated his/her understanding and acceptance.     Dental advisory given  Plan Discussed with: CRNA and Anesthesiologist  Anesthesia Plan Comments:        Anesthesia Quick Evaluation

## 2019-01-03 ENCOUNTER — Encounter (HOSPITAL_BASED_OUTPATIENT_CLINIC_OR_DEPARTMENT_OTHER): Payer: Self-pay | Admitting: Anesthesiology

## 2019-01-03 ENCOUNTER — Ambulatory Visit (HOSPITAL_BASED_OUTPATIENT_CLINIC_OR_DEPARTMENT_OTHER): Payer: No Typology Code available for payment source | Admitting: Anesthesiology

## 2019-01-03 ENCOUNTER — Encounter (HOSPITAL_BASED_OUTPATIENT_CLINIC_OR_DEPARTMENT_OTHER)
Admission: RE | Disposition: A | Discharge status: Home / Self Care | Payer: Self-pay | Source: Home / Self Care | Attending: Otolaryngology

## 2019-01-03 ENCOUNTER — Ambulatory Visit (HOSPITAL_BASED_OUTPATIENT_CLINIC_OR_DEPARTMENT_OTHER)
Admission: RE | Admit: 2019-01-03 | Discharge: 2019-01-03 | Disposition: A | Discharge status: Home / Self Care | Payer: No Typology Code available for payment source | Attending: Otolaryngology | Admitting: Otolaryngology

## 2019-01-03 ENCOUNTER — Other Ambulatory Visit: Payer: Self-pay

## 2019-01-03 DIAGNOSIS — J342 Deviated nasal septum: Secondary | ICD-10-CM | POA: Insufficient documentation

## 2019-01-03 DIAGNOSIS — Z6833 Body mass index (BMI) 33.0-33.9, adult: Secondary | ICD-10-CM | POA: Insufficient documentation

## 2019-01-03 DIAGNOSIS — J45909 Unspecified asthma, uncomplicated: Secondary | ICD-10-CM | POA: Diagnosis not present

## 2019-01-03 DIAGNOSIS — J329 Chronic sinusitis, unspecified: Secondary | ICD-10-CM | POA: Diagnosis not present

## 2019-01-03 DIAGNOSIS — J338 Other polyp of sinus: Secondary | ICD-10-CM | POA: Diagnosis not present

## 2019-01-03 DIAGNOSIS — I10 Essential (primary) hypertension: Secondary | ICD-10-CM | POA: Diagnosis not present

## 2019-01-03 DIAGNOSIS — J339 Nasal polyp, unspecified: Secondary | ICD-10-CM | POA: Diagnosis not present

## 2019-01-03 DIAGNOSIS — E669 Obesity, unspecified: Secondary | ICD-10-CM | POA: Diagnosis not present

## 2019-01-03 HISTORY — PX: ETHMOIDECTOMY: SHX5197

## 2019-01-03 HISTORY — PX: NASAL SINUS SURGERY: SHX719

## 2019-01-03 HISTORY — PX: TURBINATE REDUCTION: SHX6157

## 2019-01-03 HISTORY — PX: SINUS ENDO WITH FUSION: SHX5329

## 2019-01-03 HISTORY — PX: MAXILLARY ANTROSTOMY: SHX2003

## 2019-01-03 HISTORY — DX: Chronic maxillary sinusitis: J32.0

## 2019-01-03 LAB — BASIC METABOLIC PANEL
Anion gap: 5 (ref 5–15)
BUN: 17 mg/dL (ref 6–20)
CO2: 25 mmol/L (ref 22–32)
CREATININE: 1.19 mg/dL — AB (ref 0.44–1.00)
Calcium: 8.5 mg/dL — ABNORMAL LOW (ref 8.9–10.3)
Chloride: 110 mmol/L (ref 98–111)
GFR calc Af Amer: >60 mL/min (ref >60)
GFR calc non Af Amer: 56 mL/min — ABNORMAL LOW (ref >60)
Glucose, Bld: 110 mg/dL — ABNORMAL HIGH (ref 70–99)
Potassium: 3.6 mmol/L (ref 3.5–5.1)
Sodium: 140 mmol/L (ref 135–145)

## 2019-01-03 LAB — POCT PREGNANCY, URINE: Preg Test, Ur: NEGATIVE

## 2019-01-03 SURGERY — REDUCTION, NASAL TURBINATE
Anesthesia: General | Site: Nose | Laterality: Bilateral

## 2019-01-03 MED ORDER — METHYLPREDNISOLONE ACETATE 80 MG/ML IJ SUSP
INTRAMUSCULAR | Status: DC | PRN
  Administered 2019-01-03: 80 mg

## 2019-01-03 MED ORDER — PROPOFOL 10 MG/ML IV BOLUS
INTRAVENOUS | Status: AC
  Filled 2019-01-03: qty 20

## 2019-01-03 MED ORDER — CEFAZOLIN SODIUM-DEXTROSE 2-4 GM/100ML-% IV SOLN
2.0000 g | INTRAVENOUS | Status: AC
  Administered 2019-01-03: 2 g via INTRAVENOUS

## 2019-01-03 MED ORDER — MUPIROCIN 2 % EX OINT
TOPICAL_OINTMENT | CUTANEOUS | Status: DC | PRN
  Administered 2019-01-03: 1 via TOPICAL

## 2019-01-03 MED ORDER — ROCURONIUM BROMIDE 100 MG/10ML IV SOLN
INTRAVENOUS | Status: DC | PRN
  Administered 2019-01-03: 50 mg via INTRAVENOUS

## 2019-01-03 MED ORDER — MIDAZOLAM HCL 2 MG/2ML IJ SOLN
1.0000 mg | INTRAMUSCULAR | Status: DC | PRN
  Administered 2019-01-03: 2 mg via INTRAVENOUS

## 2019-01-03 MED ORDER — FENTANYL CITRATE (PF) 100 MCG/2ML IJ SOLN
INTRAMUSCULAR | Status: AC
  Filled 2019-01-03: qty 2

## 2019-01-03 MED ORDER — LIDOCAINE HCL (CARDIAC) PF 100 MG/5ML IV SOSY
PREFILLED_SYRINGE | INTRAVENOUS | Status: DC | PRN
  Administered 2019-01-03: 60 mg via INTRAVENOUS

## 2019-01-03 MED ORDER — ONDANSETRON HCL 4 MG/2ML IJ SOLN
INTRAMUSCULAR | Status: DC | PRN
  Administered 2019-01-03: 4 mg via INTRAVENOUS

## 2019-01-03 MED ORDER — LIDOCAINE 2% (20 MG/ML) 5 ML SYRINGE
INTRAMUSCULAR | Status: AC
  Filled 2019-01-03: qty 5

## 2019-01-03 MED ORDER — SODIUM CHLORIDE 0.9 % IV SOLN
INTRAVENOUS | Status: AC | PRN
  Administered 2019-01-03: 500 mL via INTRAMUSCULAR

## 2019-01-03 MED ORDER — METHYLPREDNISOLONE ACETATE 80 MG/ML IJ SUSP
INTRAMUSCULAR | Status: AC
  Filled 2019-01-03: qty 1

## 2019-01-03 MED ORDER — ONDANSETRON HCL 4 MG/2ML IJ SOLN
INTRAMUSCULAR | Status: AC
  Filled 2019-01-03: qty 2

## 2019-01-03 MED ORDER — LIDOCAINE-EPINEPHRINE 1 %-1:100000 IJ SOLN
INTRAMUSCULAR | Status: DC | PRN
  Administered 2019-01-03: 1 mL
  Administered 2019-01-03: 12 mL

## 2019-01-03 MED ORDER — MUPIROCIN 2 % EX OINT
TOPICAL_OINTMENT | CUTANEOUS | Status: AC
  Filled 2019-01-03: qty 22

## 2019-01-03 MED ORDER — LACTATED RINGERS IV SOLN
INTRAVENOUS | Status: DC
  Administered 2019-01-03 (×4): via INTRAVENOUS

## 2019-01-03 MED ORDER — SCOPOLAMINE 1 MG/3DAYS TD PT72: 1.0000 | MEDICATED_PATCH | Freq: Once | TRANSDERMAL | Status: DC | PRN

## 2019-01-03 MED ORDER — CHLORHEXIDINE GLUCONATE CLOTH 2 % EX PADS: 6.0000 | MEDICATED_PAD | Freq: Once | CUTANEOUS | Status: DC

## 2019-01-03 MED ORDER — SODIUM CHLORIDE 0.9 % IV SOLN
INTRAVENOUS | Status: DC | PRN
  Administered 2019-01-03: 40 ug/min via INTRAVENOUS

## 2019-01-03 MED ORDER — OXYCODONE HCL 5 MG PO TABS: 5.0000 mg | ORAL_TABLET | Freq: Once | ORAL | Status: DC | PRN

## 2019-01-03 MED ORDER — PROPOFOL 10 MG/ML IV BOLUS
INTRAVENOUS | Status: DC | PRN
  Administered 2019-01-03: 180 mg via INTRAVENOUS

## 2019-01-03 MED ORDER — CEFAZOLIN SODIUM-DEXTROSE 2-4 GM/100ML-% IV SOLN
INTRAVENOUS | Status: AC
  Filled 2019-01-03: qty 100

## 2019-01-03 MED ORDER — ROCURONIUM BROMIDE 50 MG/5ML IV SOSY
PREFILLED_SYRINGE | INTRAVENOUS | Status: AC
  Filled 2019-01-03: qty 5

## 2019-01-03 MED ORDER — OXYMETAZOLINE HCL 0.05 % NA SOLN
NASAL | Status: AC
  Filled 2019-01-03: qty 30

## 2019-01-03 MED ORDER — SUGAMMADEX SODIUM 200 MG/2ML IV SOLN
INTRAVENOUS | Status: DC | PRN
  Administered 2019-01-03: 200 mg via INTRAVENOUS

## 2019-01-03 MED ORDER — CEPHALEXIN 500 MG PO CAPS: 500.0000 mg | ORAL_CAPSULE | Freq: Two times a day (BID) | ORAL | 0 refills | Status: AC

## 2019-01-03 MED ORDER — ALBUTEROL SULFATE HFA 108 (90 BASE) MCG/ACT IN AERS
INHALATION_SPRAY | RESPIRATORY_TRACT | Status: DC | PRN
  Administered 2019-01-03: 2 via RESPIRATORY_TRACT

## 2019-01-03 MED ORDER — MIDAZOLAM HCL 2 MG/2ML IJ SOLN
INTRAMUSCULAR | Status: AC
  Filled 2019-01-03: qty 2

## 2019-01-03 MED ORDER — FENTANYL CITRATE (PF) 100 MCG/2ML IJ SOLN
50.0000 ug | INTRAMUSCULAR | Status: AC | PRN
  Administered 2019-01-03 (×3): 50 ug via INTRAVENOUS
  Administered 2019-01-03: 100 ug via INTRAVENOUS
  Administered 2019-01-03: 50 ug via INTRAVENOUS

## 2019-01-03 MED ORDER — FENTANYL CITRATE (PF) 100 MCG/2ML IJ SOLN: 25.0000 ug | INTRAMUSCULAR | Status: DC | PRN

## 2019-01-03 MED ORDER — OXYCODONE HCL 5 MG/5ML PO SOLN: 5.0000 mg | Freq: Once | ORAL | Status: DC | PRN

## 2019-01-03 MED ORDER — PROMETHAZINE HCL 25 MG/ML IJ SOLN: 6.2500 mg | INTRAMUSCULAR | Status: DC | PRN

## 2019-01-03 MED ORDER — HYDROCODONE-ACETAMINOPHEN 5-325 MG PO TABS: 1.0000 | ORAL_TABLET | Freq: Four times a day (QID) | ORAL | 0 refills | Status: DC | PRN

## 2019-01-03 MED ORDER — DEXAMETHASONE SODIUM PHOSPHATE 4 MG/ML IJ SOLN
INTRAMUSCULAR | Status: DC | PRN
  Administered 2019-01-03: 10 mg via INTRAVENOUS

## 2019-01-03 MED ORDER — OXYMETAZOLINE HCL 0.05 % NA SOLN
NASAL | Status: DC | PRN
  Administered 2019-01-03: 1 via TOPICAL

## 2019-01-03 MED ORDER — LIDOCAINE-EPINEPHRINE 1 %-1:100000 IJ SOLN
INTRAMUSCULAR | Status: AC
  Filled 2019-01-03: qty 1

## 2019-01-03 MED ORDER — DEXAMETHASONE SODIUM PHOSPHATE 10 MG/ML IJ SOLN
INTRAMUSCULAR | Status: AC
  Filled 2019-01-03: qty 1

## 2019-01-03 MED ORDER — SODIUM CHLORIDE (PF) 0.9 % IJ SOLN
INTRAMUSCULAR | Status: AC
  Filled 2019-01-03: qty 10

## 2019-01-03 SURGICAL SUPPLY — 79 items
APL SWBSTK 6 STRL LF DISP (MISCELLANEOUS)
APPLICATOR COTTON TIP 6 STRL (MISCELLANEOUS) IMPLANT
APPLICATOR COTTON TIP 6IN STRL (MISCELLANEOUS)
ATTRACTOMAT 16X20 MAGNETIC DRP (DRAPES) ×2 IMPLANT
BALLN SINUPLASTY KIT 6X16 (BALLOONS)
BALLOON SINUPLASTY KIT 6X16 (BALLOONS) IMPLANT
BLADE INF TURB ROT M4 2 5PK (BLADE) ×1 IMPLANT
BLADE RAD40 ROTATE 4M 4 5PK (BLADE) IMPLANT
BLADE RAD60 ROTATE M4 4 5PK (BLADE) IMPLANT
BLADE ROTATE RAD 12 4 M4 (BLADE) IMPLANT
BLADE ROTATE RAD 40 4 M4 (BLADE) IMPLANT
BLADE ROTATE TRICUT 4X13 M4 (BLADE) ×2 IMPLANT
BLADE SURG 15 STRL LF DISP TIS (BLADE) ×1 IMPLANT
BLADE SURG 15 STRL SS (BLADE) ×2
BLADE TRICUT ROTATE M4 4 5PK (BLADE) IMPLANT
BUR HS RAD FRONTAL 3 (BURR) IMPLANT
CANISTER SUC SOCK COL 7IN (MISCELLANEOUS) ×3 IMPLANT
CANISTER SUCT 1200ML W/VALVE (MISCELLANEOUS) ×2 IMPLANT
CLEANER CAUTERY TIP 5X5 PAD (MISCELLANEOUS) IMPLANT
COAGULATOR SUCT 8FR VV (MISCELLANEOUS) ×1 IMPLANT
COVER MAYO STAND STRL (DRAPES) ×2 IMPLANT
COVER PROBE W GEL 5X96 (DRAPES) IMPLANT
COVER WAND RF STERILE (DRAPES) IMPLANT
DECANTER SPIKE VIAL GLASS SM (MISCELLANEOUS) IMPLANT
DEVICE INFLATION SEID (MISCELLANEOUS) IMPLANT
DRAPE SURG 17X23 STRL (DRAPES) ×1 IMPLANT
DRESSING ADAPTIC 1/2  N-ADH (PACKING) IMPLANT
DRESSING NASAL KENNEDY 3.5X.9 (MISCELLANEOUS) IMPLANT
DRSG NASAL KENNEDY 3.5X.9 (MISCELLANEOUS)
DRSG NASAL KENNEDY LMNT 8CM (GAUZE/BANDAGES/DRESSINGS) IMPLANT
DRSG NASOPORE 8CM (GAUZE/BANDAGES/DRESSINGS) ×1 IMPLANT
DRSG TELFA 3X8 NADH (GAUZE/BANDAGES/DRESSINGS) IMPLANT
ELECT COATED BLADE 2.86 ST (ELECTRODE) IMPLANT
ELECT NDL BLADE 2-5/6 (NEEDLE) ×1 IMPLANT
ELECT NEEDLE BLADE 2-5/6 (NEEDLE) IMPLANT
ELECT REM PT RETURN 9FT ADLT (ELECTROSURGICAL) ×2
ELECTRODE REM PT RTRN 9FT ADLT (ELECTROSURGICAL) IMPLANT
GLOVE BIOGEL PI IND STRL 7.0 (GLOVE) IMPLANT
GLOVE BIOGEL PI INDICATOR 7.0 (GLOVE) ×2
GLOVE ECLIPSE 6.5 STRL STRAW (GLOVE) ×2 IMPLANT
GLOVE SS BIOGEL STRL SZ 7.5 (GLOVE) ×1 IMPLANT
GLOVE SUPERSENSE BIOGEL SZ 7.5 (GLOVE) ×2
GOWN STRL REUS W/ TWL LRG LVL3 (GOWN DISPOSABLE) ×2 IMPLANT
GOWN STRL REUS W/ TWL XL LVL3 (GOWN DISPOSABLE) ×1 IMPLANT
GOWN STRL REUS W/TWL LRG LVL3 (GOWN DISPOSABLE) ×4
GOWN STRL REUS W/TWL XL LVL3 (GOWN DISPOSABLE) ×2
HEMOSTAT SURGICEL .5X2 ABSORB (HEMOSTASIS) IMPLANT
HEMOSTAT SURGICEL 2X14 (HEMOSTASIS) IMPLANT
IV NS 1000ML (IV SOLUTION)
IV NS 1000ML BAXH (IV SOLUTION) IMPLANT
IV NS 500ML (IV SOLUTION) ×4
IV NS 500ML BAXH (IV SOLUTION) ×1 IMPLANT
NDL PRECISIONGLIDE 27X1.5 (NEEDLE) ×1 IMPLANT
NDL SPNL 25GX3.5 QUINCKE BL (NEEDLE) IMPLANT
NEEDLE PRECISIONGLIDE 27X1.5 (NEEDLE) ×2 IMPLANT
NEEDLE SPNL 25GX3.5 QUINCKE BL (NEEDLE) IMPLANT
NS IRRIG 1000ML POUR BTL (IV SOLUTION) ×2 IMPLANT
PACK BASIN DAY SURGERY FS (CUSTOM PROCEDURE TRAY) ×2 IMPLANT
PACK ENT DAY SURGERY (CUSTOM PROCEDURE TRAY) ×2 IMPLANT
PAD CLEANER CAUTERY TIP 5X5 (MISCELLANEOUS)
PAD DRESSING TELFA 3X8 NADH (GAUZE/BANDAGES/DRESSINGS) IMPLANT
PATTIES SURGICAL .5 X3 (DISPOSABLE) ×3 IMPLANT
PENCIL BUTTON HOLSTER BLD 10FT (ELECTRODE) IMPLANT
SHEET MEDIUM DRAPE 40X70 STRL (DRAPES) ×2 IMPLANT
SLEEVE SCD COMPRESS KNEE MED (MISCELLANEOUS) ×2 IMPLANT
SOLUTION ANTI FOG 6CC (MISCELLANEOUS) ×2 IMPLANT
SPONGE GAUZE 2X2 8PLY STRL LF (GAUZE/BANDAGES/DRESSINGS) ×2 IMPLANT
SUT CHROMIC 4 0 PS 2 18 (SUTURE) IMPLANT
SUT ETHILON 3 0 PS 1 (SUTURE) IMPLANT
SUT SILK 2 0 PERMA HAND 18 BK (SUTURE) IMPLANT
SYR 3ML 18GX1 1/2 (SYRINGE) IMPLANT
SYR CONTROL 10ML LL (SYRINGE) IMPLANT
TOWEL GREEN STERILE FF (TOWEL DISPOSABLE) ×4 IMPLANT
TRACKER ENT INSTRUMENT (MISCELLANEOUS) ×3 IMPLANT
TRACKER ENT PATIENT (MISCELLANEOUS) ×3 IMPLANT
TRAY DSU PREP LF (CUSTOM PROCEDURE TRAY) ×2 IMPLANT
TUBE CONNECTING 20X1/4 (TUBING) ×2 IMPLANT
TUBING STRAIGHTSHOT EPS 5PK (TUBING) ×2 IMPLANT
YANKAUER SUCT BULB TIP NO VENT (SUCTIONS) ×2 IMPLANT

## 2019-01-03 NOTE — Discharge Instructions (Addendum)
Elevate head of bed and apply cool compress to nose to reduce swelling and bleeding Diet as tolerated Keflex 500 mg bid for next 10 days Tylenol, ibuprofen or hydrocodone 5 mg tabs every  5 hrs prn pain You can use saline nasal irrigation to keep the nose open and reduce crusting     Post Anesthesia Home Care Instructions  Activity: Get plenty of rest for the remainder of the day. A responsible individual must stay with you for 24 hours following the procedure.  For the next 24 hours, DO NOT: -Drive a car -Paediatric nurse -Drink alcoholic beverages -Take any medication unless instructed by your physician -Make any legal decisions or sign important papers.  Meals: Start with liquid foods such as gelatin or soup. Progress to regular foods as tolerated. Avoid greasy, spicy, heavy foods. If nausea and/or vomiting occur, drink only clear liquids until the nausea and/or vomiting subsides. Call your physician if vomiting continues.  Special Instructions/Symptoms: Your throat may feel dry or sore from the anesthesia or the breathing tube placed in your throat during surgery. If this causes discomfort, gargle with warm salt water. The discomfort should disappear within 24 hours.  If you had a scopolamine patch placed behind your ear for the management of post- operative nausea and/or vomiting:  1. The medication in the patch is effective for 72 hours, after which it should be removed.  Wrap patch in a tissue and discard in the trash. Wash hands thoroughly with soap and water. 2. You may remove the patch earlier than 72 hours if you experience unpleasant side effects which may include dry mouth, dizziness or visual disturbances. 3. Avoid touching the patch. Wash your hands with soap and water after contact with the patch.

## 2019-01-03 NOTE — Anesthesia Procedure Notes (Signed)
Procedure Name: Intubation Date/Time: 01/03/2019 7:45 AM Performed by: Maryella Shivers, CRNA Pre-anesthesia Checklist: Patient identified, Emergency Drugs available, Suction available and Patient being monitored Patient Re-evaluated:Patient Re-evaluated prior to induction Oxygen Delivery Method: Circle system utilized Preoxygenation: Pre-oxygenation with 100% oxygen Induction Type: IV induction Ventilation: Mask ventilation without difficulty Laryngoscope Size: Mac and 3 Grade View: Grade I Tube type: Oral Rae Tube size: 7.0 mm Number of attempts: 1 Airway Equipment and Method: Stylet and Oral airway Placement Confirmation: ETT inserted through vocal cords under direct vision,  positive ETCO2 and breath sounds checked- equal and bilateral Secured at: 20 cm Tube secured with: Tape Dental Injury: Teeth and Oropharynx as per pre-operative assessment

## 2019-01-03 NOTE — Op Note (Signed)
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  Bilateral inferior turbinate reduction.  Slight septal deviation to the left.  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  Bilateral inferior turbinate reduction.  Slight septal deviation to the left.  OPERATION PERFORMED:  Functional endoscopic sinus surgery with bilateral total ethmoidectomy with removal of polyps, bilateral maxillary ostial enlargement with removal of polyps, nasal frontal exploration with removal of polyps, bilateral inferior  submucosal inferior turbinate reductions with Medtronic turbinate blade.  SURGEON:  [name], MD  ANESTHESIA:  General endotracheal.  ESTIMATED BLOOD LOSS:  629 mL  COMPLICATIONS:  None.  BRIEF CLINICAL NOTE:  The patient is a 43 year old female who has had a long history of sinonasal polyps and nasal obstruction as well as sinus disease.  She has been on numerous rounds of antibiotics as well as numerous rounds of steroids and nasal  steroid spray.  She is having increasing trouble breathing through her nose and on exam in the office, she has diffuse sinonasal polyps obstructing both nasal airways.  She has a slight septal deviation to the left.  She is taken to the operating room  this time for functional endoscopic sinus surgery with removal of sinonasal polyps and bilateral inferior turbinate reductions to help improve her nasal airway and breathing.  DESCRIPTION OF PROCEDURE:  The patient underwent general endotracheal anesthesia.  She received 2 grams Ancef IV preoperatively as well as Decadron.  The fusion system was calibrated and set up.  It was a little bit difficult setting up the fusion   calibration initially because a CT scan was a little bit low on the forehead.  However, finally getting the fusion setup and it worked appropriately.  The nose was prepped with Betadine solution and draped in sterile towels.  On nasal exam, she has  diffuse sinonasal polyps obstructing visualization of the nasal cavity.  Several of these large polyps were removed and sent to pathology.  After removing several of the anterior large polyps, the middle meatus was injected with approximately 10 mL of  Xylocaine with epinephrine for local hemostasis.  Next, using the microdebrider under fusion protocol, polyps were removed from the right middle meatus initially.  After removing the polyps from the right middle meatus, being care to not damage the  lamina as well as the roof of the ethmoid, the maxillary ostia was identified and was enlarged with backbiting and straight Tru-Cut forceps.  There was a fair amount of polypoid disease around the maxillary ostia that was removed with a microdebrider and  backbiting forceps.  However, on entering the maxillary sinus on the right side, there was no gross mucopurulent discharge.  The procedure was repeated on the left side.  Again, the microdebrider was used to open up the anterior and posterior ethmoid  cells.  The maxillary ostium was identified with a curved suction and was enlarged with backbiting and straight Tru-Cut forceps.  Polypoid disease was removed from the right maxillary ostia.  Again, the maxillary sinus was edematous, but no gross  mucopurulent discharge noted.  Using the microdebrider, the nasal frontal area was opened up also on both sides.  She had very small frontal sinuses.  After completing the total ethmoidectomy and nasal frontal region and maxillary ostium enlargement, the  airway was much  better, much improved.  Of note, she did have a septal deviation to the left with a small spur along the floor on the left side and this was fractured more  toward midline.  The sphenoid sinuses were not entered on either side.   Hemostasis was obtained with cotton pledgets soaked in Afrin as well as suction cautery.  Following completion of the sinus proportion, inferior turbinate reductions were performed with the Medtronic turbinate blade, submucosal turbinate reductions were  performed bilaterally and the turbinate bone was outfractured.  Hemostasis was obtained with suction cautery.  After obtaining adequate hemostasis, Nasopore was packed within the ethmoid regions bilaterally.  Following completion of everything, 80 mg of  Depo-Medrol were injected into the middle turbinate and ethmoid areas on both sides.  This completed the procedure.  The patient was awoken from anesthesia and transferred to recovery room postop doing well.  DISPOSITION:  She is discharged home later this morning on Keflex 500 mg b.i.d. for 10 days.  Tylenol, ibuprofen and/or hydrocodone 5/325 mg tablets q.6 hours p.r.n. pain.  She will follow up in my office in 5 days for recheck.  TN/NUANCE  D:01/03/2019 T:01/03/2019 JOB:[id number]

## 2019-01-03 NOTE — Interval H&P Note (Signed)
History and Physical Interval Note:  0/97/9499 7:18 AM  Jillian Vasquez  has presented today for surgery, with the diagnosis of NASAL POLYP, CHRONIC PANSINUSITIS.  The various methods of treatment have been discussed with the patient and family. After consideration of risks, benefits and other options for treatment, the patient has consented to  Procedure(s): TURBINATE REDUCTION (Bilateral) ENDOSCOPIC SINUS SURGERY WITH SHENOIDOTOMY (Bilateral) ETHMOIDECTOMY (Bilateral) MAXILLARY ANTROSTOMY WITH REMOVAL OF TISSUE (Bilateral) SINUS ENDO WITH FUSION (Bilateral) as a surgical intervention.  The patient's history has been reviewed, patient examined, no change in status, stable for surgery.  I have reviewed the patient's chart and labs.  Questions were answered to the patient's satisfaction.     [name]

## 2019-01-03 NOTE — Progress Notes (Signed)
EKG and BMET completed in preop

## 2019-01-03 NOTE — Transfer of Care (Signed)
Immediate Anesthesia Transfer of Care Note  Patient: Jillian Vasquez  Procedure(s) Performed: Albin Felling REDUCTION (Bilateral Nose) ENDOSCOPIC SINUS SURGERY WITH SHENOIDOTOMY (Bilateral Nose) ETHMOIDECTOMY (Bilateral Nose) MAXILLARY ANTROSTOMY WITH REMOVAL OF TISSUE (Bilateral Nose) SINUS ENDOSCOPY WITH FUSION NAVIGATION (Bilateral Nose)  Patient Location: PACU  Anesthesia Type:General  Level of Consciousness: awake, alert  and oriented  Airway & Oxygen Therapy: Patient Spontanous Breathing and Patient connected to face mask oxygen  Post-op Assessment: Report given to RN and Post -op Vital signs reviewed and stable  Post vital signs: Reviewed and stable  Last Vitals:  Vitals Value Taken Time  BP 113/63 01/03/2019 11:25 AM  Temp    Pulse 107 01/03/2019 11:29 AM  Resp 22 01/03/2019 11:29 AM  SpO2 100 % 01/03/2019 11:29 AM  Vitals shown include unvalidated device data.  Last Pain:  Vitals:   01/03/19 0656  TempSrc: Oral  PainSc: 0-No pain      Patients Stated Pain Goal: 3 (88/75/79 7282)  Complications: No apparent anesthesia complications

## 2019-01-03 NOTE — Brief Op Note (Signed)
01/03/2019  11:02 AM  PATIENT:  Jillian Vasquez  43 y.o. female  PRE-OPERATIVE DIAGNOSIS:  NASAL POLYP, CHRONIC PANSINUSITIS  POST-OPERATIVE DIAGNOSIS:  NASAL POLYP, CHRONIC PANSINUSITIS  PROCEDURE:  Procedure(s): TURBINATE REDUCTION (Bilateral) ENDOSCOPIC SINUS SURGERY WITH SHENOIDOTOMY (Bilateral) ETHMOIDECTOMY (Bilateral) MAXILLARY ANTROSTOMY WITH REMOVAL OF TISSUE (Bilateral) SINUS ENDOSCOPY WITH FUSION NAVIGATION (Bilateral)  SURGEON:  Surgeon(s) and Role:    Rozetta Nunnery, MD - Primary  PHYSICIAN ASSISTANT:   ASSISTANTS: none   ANESTHESIA:   general  EBL:  400 mL   BLOOD ADMINISTERED:none  DRAINS: none   LOCAL MEDICATIONS USED:  XYLOCAINE   SPECIMEN:  Source of Specimen:  sinonasal poyps  DISPOSITION OF SPECIMEN:  PATHOLOGY  COUNTS:  YES  TOURNIQUET:  * No tourniquets in log *  DICTATION: .Other Dictation: Dictation Number [id number]  PLAN OF CARE: Discharge to home after PACU  PATIENT DISPOSITION:  PACU - hemodynamically stable.   Delay start of Pharmacological VTE agent (>24hrs) due to surgical blood loss or risk of bleeding: yes

## 2019-01-04 ENCOUNTER — Encounter (HOSPITAL_BASED_OUTPATIENT_CLINIC_OR_DEPARTMENT_OTHER): Payer: Self-pay | Admitting: Otolaryngology

## 2019-01-08 NOTE — Anesthesia Postprocedure Evaluation (Signed)
Anesthesia Post Note  Patient: Jillian Vasquez  Procedure(s) Performed: TURBINATE REDUCTION (Bilateral Nose) ENDOSCOPIC SINUS SURGERY WITH SHENOIDOTOMY (Bilateral Nose) ETHMOIDECTOMY (Bilateral Nose) MAXILLARY ANTROSTOMY WITH REMOVAL OF TISSUE (Bilateral Nose) SINUS ENDOSCOPY WITH FUSION NAVIGATION (Bilateral Nose)     Patient location during evaluation: PACU Anesthesia Type: General Level of consciousness: awake and alert Pain management: pain level controlled Vital Signs Assessment: post-procedure vital signs reviewed and stable Respiratory status: spontaneous breathing, nonlabored ventilation and respiratory function stable Cardiovascular status: blood pressure returned to baseline and stable Postop Assessment: no apparent nausea or vomiting Anesthetic complications: no    Last Vitals:  Vitals:   01/03/19 1230 01/03/19 1245  BP: 116/80 126/80  Pulse: (!) 107 (!) 124  Resp: 12 16  Temp:  36.6 C  SpO2: 100% 100%    Last Pain:  Vitals:   01/03/19 1346  TempSrc:   PainSc: 0-No pain                 Audry Pili

## 2019-01-11 ENCOUNTER — Ambulatory Visit: Payer: No Typology Code available for payment source

## 2019-01-11 ENCOUNTER — Ambulatory Visit
Admission: RE | Admit: 2019-01-11 | Discharge: 2019-01-11 | Disposition: A | Discharge status: Home / Self Care | Payer: No Typology Code available for payment source | Source: Ambulatory Visit | Attending: Family Medicine | Admitting: Family Medicine

## 2019-01-11 ENCOUNTER — Other Ambulatory Visit: Payer: Self-pay

## 2019-01-11 DIAGNOSIS — N644 Mastodynia: Secondary | ICD-10-CM

## 2019-02-11 ENCOUNTER — Telehealth (INDEPENDENT_AMBULATORY_CARE_PROVIDER_SITE_OTHER): Payer: No Typology Code available for payment source | Admitting: Family Medicine

## 2019-02-11 ENCOUNTER — Encounter: Payer: Self-pay | Admitting: Family Medicine

## 2019-02-11 ENCOUNTER — Other Ambulatory Visit: Payer: Self-pay

## 2019-02-11 DIAGNOSIS — J4520 Mild intermittent asthma, uncomplicated: Secondary | ICD-10-CM

## 2019-02-11 DIAGNOSIS — J301 Allergic rhinitis due to pollen: Secondary | ICD-10-CM

## 2019-02-11 DIAGNOSIS — I10 Essential (primary) hypertension: Secondary | ICD-10-CM

## 2019-02-11 MED ORDER — CETIRIZINE HCL 10 MG PO TABS: 10.0000 mg | ORAL_TABLET | Freq: Every day | ORAL | 2 refills | Status: DC

## 2019-02-11 MED ORDER — FLUTICASONE PROPIONATE 50 MCG/ACT NA SUSP: 2.0000 | Freq: Every day | NASAL | 6 refills | Status: DC

## 2019-02-11 MED ORDER — AMLODIPINE BESYLATE 10 MG PO TABS: 10.0000 mg | ORAL_TABLET | Freq: Every day | ORAL | 2 refills | Status: DC

## 2019-02-11 MED ORDER — ALBUTEROL SULFATE HFA 108 (90 BASE) MCG/ACT IN AERS: 2.0000 | INHALATION_SPRAY | RESPIRATORY_TRACT | 3 refills | Status: DC | PRN

## 2019-02-11 MED ORDER — LISINOPRIL-HYDROCHLOROTHIAZIDE 20-25 MG PO TABS: 1.0000 | ORAL_TABLET | Freq: Every day | ORAL | 2 refills | Status: DC

## 2019-02-11 NOTE — Progress Notes (Signed)
[place] Telemedicine Visit  Patient consented to have virtual visit. Method of visit: Video  Encounter participants: Patient: Jillian Vasquez - located at home Provider: Martyn Malay - located at [facility]  Others (if applicable): none   Chief Complaint: congestion and dry cough   HPI:  Jillian Vasquez Jillian Vasquez is a pleasant 43 year old woman with history of HTN, asthma (no PFTs), seasonal allergies, and anemia presenting via video visit for evaluation of nasal congestion and a dry cough.  The patient reports overall she is been doing well.  She has noticed 3 weeks of worsening allergy symptoms with some nasal congestion and itchy eyes.  With the past few days she has noticed a dry cough.  She works in a [employer].  They have a reduced number of children present.  She denies any sick contacts with coronavirus.  She denies any fevers, shortness of breath, chest pain.  She has some clear sputum production.  She is on an ACE inhibitor but is not had this cough before.  She is using her inhaler with relief.  ROS: per HPI  Pertinent PMHx:  Asthma Seasonal allergies Hypertension on an ACE inhibitor  Exam:  Respiratory: Dry nonproductive cough superficial in upper airway.  No wheezing speaking in full sentences.  Assessment/Plan:  Diagnoses and all orders for this visit:  Seasonal allergic rhinitis due to pollen -     fluticasone (FLONASE) 50 MCG/ACT nasal spray; Place 2 sprays into both nostrils daily. -     cetirizine (ZYRTEC) 10 MG tablet; Take 1 tablet (10 mg total) by mouth daily.  Essential hypertension -     lisinopril-hydrochlorothiazide (ZESTORETIC) 20-25 MG tablet; Take 1 tablet by mouth daily. -     amLODipine (NORVASC) 10 MG tablet; Take 1 tablet (10 mg total) by mouth daily.  Mild intermittent asthma, unspecified whether complicated -     albuterol (VENTOLIN HFA) 108 (90 Base) MCG/ACT inhaler; Inhale 2 puffs into the  lungs every 4 (four) hours as needed for wheezing or shortness of breath.  Suspect that the majority of her symptoms are due to allergies at this point.  I have refilled her allergy pills and recommended a trial of intranasal corticosteroids.  No signs or symptoms suggestive of coronavirus although given that the disease can be mild and present as simply an upper airway disease in healthier patients recommended staying home from work until we get the symptoms cleared up as to reduce possible asymptomatic spread of disease.  We will write a letter for 2 days from work.  If her symptoms are improved she can go back to work on Wednesday.  Time spent during visit with patient: 10 minutes

## 2019-03-13 ENCOUNTER — Other Ambulatory Visit: Payer: No Typology Code available for payment source

## 2019-03-13 ENCOUNTER — Telehealth: Payer: Self-pay | Admitting: *Deleted

## 2019-03-13 ENCOUNTER — Telehealth (INDEPENDENT_AMBULATORY_CARE_PROVIDER_SITE_OTHER): Payer: No Typology Code available for payment source | Admitting: Family Medicine

## 2019-03-13 ENCOUNTER — Other Ambulatory Visit: Payer: Self-pay

## 2019-03-13 DIAGNOSIS — R05 Cough: Secondary | ICD-10-CM

## 2019-03-13 DIAGNOSIS — Z20822 Contact with and (suspected) exposure to covid-19: Secondary | ICD-10-CM

## 2019-03-13 DIAGNOSIS — R059 Cough, unspecified: Secondary | ICD-10-CM

## 2019-03-13 MED ORDER — PREDNISONE 50 MG PO TABS: 50.0000 mg | ORAL_TABLET | Freq: Every day | ORAL | 0 refills | Status: DC

## 2019-03-13 NOTE — Telephone Encounter (Signed)
Contacted pt to schedule COVID testing per Dr Chrisandra Netters; pt offered and accepted appointment at [facility] site 03/13/2019 at 1030; pt also given address, directions, and instructions that she and all occupants of her vehicle should wear a mask; orders placed per protocol; will route to provider for nortification

## 2019-03-13 NOTE — Progress Notes (Signed)
[place] Telemedicine Visit  Patient consented to have virtual visit. Method of visit: Video via doximity dialer  Encounter participants: Patient: Jillian Vasquez - located at home Provider: Chrisandra Netters - located at office Others (if applicable): n/a  Chief Complaint: cough, shortness of breath   HPI:  Patient seen via video visit for cough & shortness of breath. Reports for a few days she has had productive cough. No fever. Has history of asthma and is using her inhaler, which helps sometimes but not always. Has heard herself wheezing. Does have nasal congestion & drainage but reports last month she had sinus surgery. Yesterday her left eye was watering. Has been blowing nose a lot. Eating and drinking well. 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 Has otherwise been social distancing. Feels shortness of breath when walking up stairs but is not gasping for air. Comfortable at rest. She is concerned that she should be tested for COVID.  ROS: per HPI  Pertinent PMHx: history of asthma, chronic sinusitis, hypertension   Exam:  Respiratory: Patient speaking normally in full sentences throughout the encounter, without any respiratory distress evident. No cough heard during encounter.  Assessment/Plan:  Cough Patient seen in context of COVID-19 pandemic. She has symptoms which could certainly be attributed to COVID (cough, shortness of breath, congestion). I do think having her tested as an outpatient would be prudent. She is in no distress over the phone and patient agrees she is not ill enough to be evaluated in the ED. Will message [facility] pool to arrange drive up COVID testing. Instructed patient to stay home from work until test has resulted.   Symptoms could also be flare of asthma; patient reports has taken prednisone many times in the past for asthma. Will rx prednisone 50mg  daily x5 days.    Also discussed importance of social distancing,  wearing mask when out in public (though should stay home for now), avoiding contact with others as general precautions during this pandemic.  Patient agreeable and appreciative.  Time spent during visit with patient: 12 minutes

## 2019-03-17 LAB — NOVEL CORONAVIRUS, NAA: SARS-CoV-2, NAA: NOT DETECTED

## 2019-05-26 ENCOUNTER — Telehealth: Payer: Self-pay | Admitting: Family Medicine

## 2019-05-26 MED ORDER — ALBUTEROL SULFATE (2.5 MG/3ML) 0.083% IN NEBU: 2.5000 mg | INHALATION_SOLUTION | Freq: Four times a day (QID) | RESPIRATORY_TRACT | 1 refills | Status: DC | PRN

## 2019-05-26 NOTE — Telephone Encounter (Signed)
**  After Hours/ Emergency Line Call**  Received a call to report that [facility] is calling because she is having continued asthma. She has run out of her neb treatments and now only has her inhaler. She is still having SOB, wheezing, and coughing. Has had COVID negative test from 02/2019.  Denying fevers.  Recommended that she be evaluated in the office through a virtual visit on 8/3. Appointment scheduled. Refilled her albuterol neb solution. Red flags discussed.  Will forward to PCP.  [place] Becki Mccaskill, DO PGY-3, [facility] 05/26/2019 11:16 AM

## 2019-05-27 ENCOUNTER — Other Ambulatory Visit: Payer: Self-pay

## 2019-05-27 ENCOUNTER — Telehealth (INDEPENDENT_AMBULATORY_CARE_PROVIDER_SITE_OTHER): Payer: No Typology Code available for payment source | Admitting: Family Medicine

## 2019-05-27 DIAGNOSIS — R6889 Other general symptoms and signs: Secondary | ICD-10-CM

## 2019-05-27 DIAGNOSIS — Z20822 Contact with and (suspected) exposure to covid-19: Secondary | ICD-10-CM

## 2019-05-27 DIAGNOSIS — J4521 Mild intermittent asthma with (acute) exacerbation: Secondary | ICD-10-CM

## 2019-05-27 MED ORDER — PREDNISONE 50 MG PO TABS: 50.0000 mg | ORAL_TABLET | Freq: Every day | ORAL | 0 refills | Status: DC

## 2019-05-27 MED ORDER — MONTELUKAST SODIUM 10 MG PO TABS: 10.0000 mg | ORAL_TABLET | Freq: Every day | ORAL | 3 refills | Status: DC

## 2019-05-27 NOTE — Progress Notes (Signed)
158lb  120/85 t- no fever  [id number] - [place], [place] AT [place]  Pt gave consent to telephone visit. Salvatore Marvel, CMA

## 2019-05-27 NOTE — Progress Notes (Signed)
[place] Telemedicine Visit  Patient consented to have virtual visit. Method of visit: Video  Encounter participants: Patient: Jillian Vasquez - located at home Provider: [place] Innocence Schlotzhauer - located at [facility]  Others (if applicable): n/a  Chief Complaint:   HPI:  Cough and SOB: For one week. Using albuterol everyday, 3-4 times per day. No fevers, no chills. No body aches. 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 Occasionally wheezing. No congestion but has some runny nose. Has trouble with allergies, still doing nasal spray.  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 By the time she is at the top of her stairs using  Got asthma a couple of years ago.  ROS: per HPI  Pertinent PMHx: Asthma  Exam:  General: Well-appearing, NAD Respiratory: able to speak in complete sentences without issue, comfortable work of breathing  Assessment/Plan:  Asthma Could be asthma exacerbation.  Patient provided with 5-day burst of prednisone 50 mg.  We will also start patient on Singulair daily given her history of allergies.  She may require controller medication if she continues to have frequent use of albuterol inhaler.  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  [id number] for COVID testing placed.  Patient advised of hours 8am-4pm and that they should be in line by 3 PM.  -ED precautions discussed and patient expressed good understanding -Patient counseled on wearing a mask, washing hands -Patient instructed to avoid others until she meets criteria for ending isolation after any suspected COVID, which are:  -3 days with no fever and  -Respiratory symptoms have improved (e.g. cough, shortness of breath) or  -10 days since symptoms first appeared   Time spent during visit with patient: 15 minutes  [place] Amayra Kiedrowski, DO PGY-3, [place]

## 2019-05-28 ENCOUNTER — Other Ambulatory Visit: Payer: Self-pay

## 2019-05-28 DIAGNOSIS — Z20822 Contact with and (suspected) exposure to covid-19: Secondary | ICD-10-CM

## 2019-05-28 NOTE — Assessment & Plan Note (Addendum)
Could be asthma exacerbation.  Patient provided with 5-day burst of prednisone 50 mg.  We will also start patient on Singulair daily given her history of allergies.  She may require controller medication if she continues to have frequent use of albuterol inhaler.  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   [id number] for COVID testing placed.  Patient advised of hours 8am-4pm and that they should be in line by 3 PM.  -ED precautions discussed and patient expressed good understanding -Patient counseled on wearing a mask, washing hands -Patient instructed to avoid others until she meets criteria for ending isolation after any suspected COVID, which are:  -3 days with no fever and  -Respiratory symptoms have improved (e.g. cough, shortness of breath) or  -10 days since symptoms first appeared

## 2019-05-29 LAB — NOVEL CORONAVIRUS, NAA: SARS-CoV-2, NAA: NOT DETECTED

## 2019-09-04 ENCOUNTER — Telehealth (INDEPENDENT_AMBULATORY_CARE_PROVIDER_SITE_OTHER): Payer: Self-pay | Admitting: Family Medicine

## 2019-09-04 ENCOUNTER — Other Ambulatory Visit: Payer: Self-pay

## 2019-09-04 DIAGNOSIS — J301 Allergic rhinitis due to pollen: Secondary | ICD-10-CM

## 2019-09-04 DIAGNOSIS — R0981 Nasal congestion: Secondary | ICD-10-CM | POA: Insufficient documentation

## 2019-09-04 MED ORDER — FLUTICASONE PROPIONATE 50 MCG/ACT NA SUSP: 2.0000 | Freq: Every day | NASAL | 6 refills | Status: DC

## 2019-09-04 NOTE — Progress Notes (Signed)
[place] Telemedicine Visit I connected with  Jillian Vasquez on 09/04/19 by a video enabled telemedicine application and verified that I am speaking with the correct person using two identifiers.   I discussed the limitations of evaluation and management by telemedicine. The patient expressed understanding and agreed to proceed.  Patient consented to have virtual visit. Method of visit: Video  Encounter participants: Patient: Jillian Vasquez - located at Home Provider: Caroline More - located at [facility] Others (if applicable): None  Chief Complaint: SOB  HPI: SOB Patient reports she has had two COVID tests prior. Used nebulizer twice this week. This usually happens when weather changes. This week she has noticed breathing has worsened. Has limited going to places and how many people she is around. Is refusing a COVID test as she has been tested frequently. Has started a new medicine that is now one in the morning, one at night (singulair and cetirazine). Had a surgery for her recurrent sinus infections in the beginning of the year. Since the surgery she has not been able to smell. Reports nasal drainage and clear drainage.  Is not using her Flonase.  Is frustrated as she feels like she did not have relief from surgery.   Used her rescue inhaler twice a day x3 days. House has been more dusty as they are getting kitchen re-done.   ROS: per HPI  Pertinent PMHx: sinusitis (chronic)  Exam:  Respiratory: speaking full sentences, no increased WOB, NAD  Assessment/Plan:  Nasal congestion This seems to be a chronic problem, patient agreed with this assessment.  Reports that symptoms have been occurring since she had her turbinate surgery back in March.  Patient has not been able to smell and has had clear nasal drainage since.  Has had multiple negative Covid test and reports that symptoms have not changed since March so unlikely  coronavirus.  Unlikely asthma exacerbation as patient is not having any wheezing or shortness of breath involving lungs.  I believe this difficulty breathing is related to nasal congestion.  States that she did have to use her inhaler twice daily x3 days but this is because she is getting her kitchen redone and is very dusty.  Reports no exacerbations of asthma when her house was not dusty.  Patient would benefit from seeing ENT for follow-up.  I will place referral but patient can also call her ENT doctor who performed surgery to follow-up in office.  Advised to use Flonase daily until she is able to see ENT.  Continue cetirizine as well as Singulair as this seems to be keeping her asthma under control.  Strict return precautions given.  Advised that if symptoms change such as increased shortness of breath or fever she needs to get coronavirus testing and likely go to the emergency department if she is having shortness of breath.  Patient verbalized understanding of this plan.    Time spent during visit with patient: 10 minutes

## 2019-09-04 NOTE — Assessment & Plan Note (Signed)
This seems to be a chronic problem, patient agreed with this assessment.  Reports that symptoms have been occurring since she had her turbinate surgery back in March.  Patient has not been able to smell and has had clear nasal drainage since.  Has had multiple negative Covid test and reports that symptoms have not changed since March so unlikely coronavirus.  Unlikely asthma exacerbation as patient is not having any wheezing or shortness of breath involving lungs.  I believe this difficulty breathing is related to nasal congestion.  States that she did have to use her inhaler twice daily x3 days but this is because she is getting her kitchen redone and is very dusty.  Reports no exacerbations of asthma when her house was not dusty.  Patient would benefit from seeing ENT for follow-up.  I will place referral but patient can also call her ENT doctor who performed surgery to follow-up in office.  Advised to use Flonase daily until she is able to see ENT.  Continue cetirizine as well as Singulair as this seems to be keeping her asthma under control.  Strict return precautions given.  Advised that if symptoms change such as increased shortness of breath or fever she needs to get coronavirus testing and likely go to the emergency department if she is having shortness of breath.  Patient verbalized understanding of this plan.

## 2019-10-05 ENCOUNTER — Ambulatory Visit (INDEPENDENT_AMBULATORY_CARE_PROVIDER_SITE_OTHER): Payer: Self-pay

## 2019-10-05 ENCOUNTER — Encounter (HOSPITAL_COMMUNITY): Payer: Self-pay | Admitting: *Deleted

## 2019-10-05 ENCOUNTER — Other Ambulatory Visit: Payer: Self-pay

## 2019-10-05 ENCOUNTER — Ambulatory Visit (HOSPITAL_COMMUNITY)
Admission: EM | Admit: 2019-10-05 | Discharge: 2019-10-05 | Disposition: A | Discharge status: Home / Self Care | Payer: Self-pay | Attending: Emergency Medicine | Admitting: Emergency Medicine

## 2019-10-05 DIAGNOSIS — J4521 Mild intermittent asthma with (acute) exacerbation: Secondary | ICD-10-CM

## 2019-10-05 DIAGNOSIS — R059 Cough, unspecified: Secondary | ICD-10-CM

## 2019-10-05 DIAGNOSIS — R05 Cough: Secondary | ICD-10-CM

## 2019-10-05 MED ORDER — PREDNISONE 50 MG PO TABS: 50.0000 mg | ORAL_TABLET | Freq: Every day | ORAL | 0 refills | Status: AC

## 2019-10-05 NOTE — ED Triage Notes (Addendum)
C/O increasingly frequent need for breathing treatments over past couple months.  Started with slight productive cough x "rattling" in chest over past couple weeks without fevers.  Most recent Covid test at PCP 2 wks ago was neg per pt.    Pt states "feels like when I've had pneumonia".  Pt declining Covid test recheck today.

## 2019-10-05 NOTE — ED Provider Notes (Signed)
[place]    CSN: [id number] Arrival date & time: 10/05/19  1005      History   Chief Complaint Chief Complaint  Patient presents with  . Cough  . Nasal Congestion    HPI Jillian Vasquez is a 43 y.o. female.   Jillian Vasquez presents with complaints of cough. Using inhaler, which does help. With changes in weather typically can need to use her nebulizer. Last night needed her nebulizer. Heard "rattling" in her chest last night. She feels like it started approximately 5-6 days ago, but last night was worse. Negative covid testing two weeks, states has had this cough at least two weeks. Use of prednisone a few months ago helped with similar symptoms. Cough is occasional. Some production with cough. No new nasal drainage. No fever. No body aches headache. No sore throat no ear pain. No gi symptoms. Hasn't used inhaler or nebulizer today. Intermittent wheezing. Doesn't smoke. Taking cetirizine and singulair, as well as flonase. No known ill contacts. Works at a daycare. History of asthma, htn, sinusitis with surgery.     ROS per HPI, negative if not otherwise mentioned.      Past Medical History:  Diagnosis Date  . Asthma   . Chronic sinusitis of both maxillary sinuses   . Headache disorder    "from my BP"  . Hypertension   . Hypertensive emergency without congestive heart failure 03/06/2012  . Sinus headache     Patient Active Problem List   Diagnosis Date Noted  . Nasal congestion 09/04/2019  . 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 Sinusitis, chronic 03/07/2014  . Asthma 01/02/2013  . Left hand weakness 03/30/2012  . Anemia 03/15/2012  . Overweight(278.02) 05/21/2009  . HYPERTENSION, BENIGN SYSTEMIC 12/21/2006    Past Surgical History:  Procedure Laterality Date  . BARTHOLIN CYST MARSUPIALIZATION N/A 08/29/2014   Procedure: BARTHOLIN CYST MARSUPIALIZATION;  Surgeon: Osborne Oman,  Vasquez;  Location: [facility];  Service: Gynecology;  Laterality: N/A;  . CESAREAN SECTION  2000  . ETHMOIDECTOMY Bilateral 01/03/2019   Procedure: ETHMOIDECTOMY;  Surgeon: Jillian Nunnery, Vasquez;  Location: [facility];  Service: ENT;  Laterality: Bilateral;  . MAXILLARY ANTROSTOMY Bilateral 01/03/2019   Procedure: MAXILLARY ANTROSTOMY WITH REMOVAL OF TISSUE;  Surgeon: Jillian Nunnery, Vasquez;  Location: [place];  Service: ENT;  Laterality: Bilateral;  . NASAL SINUS SURGERY Bilateral 01/03/2019   Procedure: ENDOSCOPIC SINUS SURGERY WITH SHENOIDOTOMY;  Surgeon: Jillian Nunnery, Vasquez;  Location: [place];  Service: ENT;  Laterality: Bilateral;  . SINUS ENDO WITH FUSION Bilateral 01/03/2019   Procedure: SINUS ENDOSCOPY WITH FUSION NAVIGATION;  Surgeon: Jillian Nunnery, Vasquez;  Location: [place];  Service: ENT;  Laterality: Bilateral;  . TUBAL LIGATION  2000  . TURBINATE REDUCTION Bilateral 01/03/2019   Procedure: TURBINATE REDUCTION;  Surgeon: Jillian Nunnery, Vasquez;  Location: [facility];  Service: ENT;  Laterality: Bilateral;    OB History    Gravida  2   Para  2   Term  2   Preterm      AB  0   Living  3     SAB  0   TAB  0   Ectopic      Multiple  1   Live Births               Home Medications    Prior  to Admission medications   Medication Sig Start Date End Date Taking? Authorizing Provider  albuterol (PROVENTIL) (2.5 MG/3ML) 0.083% nebulizer solution Take 3 mLs (2.5 mg total) by nebulization every 6 (six) hours as needed for wheezing or shortness of breath. 05/26/19  Yes Jillian Vasquez  albuterol (VENTOLIN HFA) 108 (90 Base) MCG/ACT inhaler Inhale 2 puffs into the lungs every 4 (four) hours as needed for wheezing or shortness of breath. 02/11/19  Yes Martyn Malay, Vasquez  amLODipine (NORVASC) 10 MG tablet Take 1 tablet (10 mg total) by mouth daily. 02/11/19  Yes Martyn Malay,  Vasquez  cetirizine (ZYRTEC) 10 MG tablet Take 1 tablet (10 mg total) by mouth daily. 02/11/19  Yes Martyn Malay, Vasquez  cyclobenzaprine (FLEXERIL) 10 MG tablet Take 1 tablet (10 mg total) 2 (two) times daily as needed by mouth for muscle spasms. 08/29/17  Yes Jillian Vasquez  fluticasone (FLONASE) 50 MCG/ACT nasal spray Place 2 sprays into both nostrils daily. 09/04/19  Yes Jillian Vasquez  lisinopril-hydrochlorothiazide (ZESTORETIC) 20-25 MG tablet Take 1 tablet by mouth daily. 02/11/19  Yes Martyn Malay, Vasquez  montelukast (SINGULAIR) 10 MG tablet Take 1 tablet (10 mg total) by mouth at bedtime. 05/27/19  Yes Jillian Vasquez  Multiple Vitamin (MULTIVITAMIN WITH MINERALS) TABS tablet Take 1 tablet by mouth daily.   Yes Provider, Historical, Vasquez  ibuprofen (ADVIL,MOTRIN) 600 MG tablet Take 1 tablet (600 mg total) by mouth every 6 (six) hours as needed for headache, mild pain or moderate pain. 08/29/14   Jillian Vasquez  predniSONE (DELTASONE) 50 MG tablet Take 1 tablet (50 mg total) by mouth daily with breakfast for 5 days. 10/05/19 10/10/19  Zigmund Gottron, NP    Family History Family History  Problem Relation Age of Onset  . Hypertension Mother   . Hypertension Father   . Hypertension Maternal Aunt   . Breast cancer Maternal Aunt   . Hypertension Maternal Uncle   . Breast cancer Maternal Grandmother     Social History Social History   Tobacco Use  . Smoking status: Never Smoker  . Smokeless tobacco: Never Used  Substance Use Topics  . Alcohol use: Yes    Comment: rarely  . Drug use: No     Allergies   Patient has no known allergies.   Review of Systems Review of Systems   Physical Exam Triage Vital Signs ED Triage Vitals  Enc Vitals Group     BP 10/05/19 1022 (!) 145/79     Pulse Rate 10/05/19 1022 78     Resp 10/05/19 1022 18     Temp 10/05/19 1022 98.8 F (37.1 C)     Temp Source 10/05/19 1022 Oral     SpO2 10/05/19 1022 100 %     Weight --       Height --      Head Circumference --      Peak Flow --      Pain Score 10/05/19 1025 0     Pain Loc --      Pain Edu? --      Excl. in [place]? --    No data found.  Updated Vital Signs BP (!) 145/79   Pulse 78   Temp 98.8 F (37.1 C) (Oral)   Resp 18   LMP 09/27/2019   SpO2 100%   Visual Acuity Right Eye Distance:   Left Eye Distance:   Bilateral Distance:    Right  Eye Near:   Left Eye Near:    Bilateral Near:     Physical Exam Constitutional:      General: She is not in acute distress.    Appearance: She is well-developed.  Cardiovascular:     Rate and Rhythm: Normal rate and regular rhythm.     Heart sounds: Normal heart sounds.  Pulmonary:     Effort: Pulmonary effort is normal.     Breath sounds: Wheezing present.     Comments: Occasional wheezes noted; no cough throughout exam, no difficulty breathing  Skin:    General: Skin is warm and dry.  Neurological:     Mental Status: She is alert and oriented to person, place, and time.      UC Treatments / Results  Labs (all labs ordered are listed, but only abnormal results are displayed) Labs Reviewed - No data to display  EKG   Radiology DG Chest 2 View  Result Date: 10/05/2019 CLINICAL DATA:  Wheezing, hypertension, asthma EXAM: CHEST - 2 VIEW COMPARISON:  08/29/2017 FINDINGS: The heart size and mediastinal contours are within normal limits. Both lungs are clear. The visualized skeletal structures are unremarkable. IMPRESSION: No active cardiopulmonary disease. Electronically Signed   By: Jerilynn Mages.  Shick M.D.   On: 10/05/2019 11:15    Procedures Procedures (including critical care time)  Medications Ordered in UC Medications - No data to display  Initial Impression / Assessment and Plan / UC Course  I have reviewed the triage vital signs and the nursing notes.  Pertinent labs & imaging results that were available during my care of the patient were reviewed by me and considered in my medical decision  making (see chart for details).     Non toxic. Benign physical exam.  No increased work of breathing. No tachycardia, no hypoxia. Declines further covid-19 testing. Symptoms similar to asthma flares in the past. Chest xray reassuring. Prednisone provided. Return precautions provided. Patient verbalized understanding and agreeable to plan.   Final Clinical Impressions(s) / UC Diagnoses   Final diagnoses:  Cough  Mild intermittent asthma with acute exacerbation     Discharge Instructions     Your chest xray looks well today, there are no indications of pneumonia, consistent with what you have shared with me today.  Continue with your inhaler or nebulizers as needed.  Push fluids to ensure adequate hydration and keep secretions thin.  Continue with your daily allergy medications and nasal spray.  A mucinex as an expectorant may also be helpful.  5 days of prednisone.  If symptoms worsen or Vasquez not improve in the next week to return to be seen or to follow up with your PCP.      ED Prescriptions    Medication Sig Dispense Auth. Provider   predniSONE (DELTASONE) 50 MG tablet Take 1 tablet (50 mg total) by mouth daily with breakfast for 5 days. 5 tablet Zigmund Gottron, NP     PDMP not reviewed this encounter.   Zigmund Gottron, NP 10/05/19 1156

## 2019-10-05 NOTE — Discharge Instructions (Signed)
Your chest xray looks well today, there are no indications of pneumonia, consistent with what you have shared with me today.  Continue with your inhaler or nebulizers as needed.  Push fluids to ensure adequate hydration and keep secretions thin.  Continue with your daily allergy medications and nasal spray.  A mucinex as an expectorant may also be helpful.  5 days of prednisone.  If symptoms worsen or do not improve in the next week to return to be seen or to follow up with your PCP.

## 2019-12-30 ENCOUNTER — Telehealth: Payer: Self-pay | Admitting: Family Medicine

## 2019-12-30 ENCOUNTER — Other Ambulatory Visit: Payer: Self-pay

## 2019-12-30 DIAGNOSIS — J301 Allergic rhinitis due to pollen: Secondary | ICD-10-CM

## 2019-12-30 DIAGNOSIS — I10 Essential (primary) hypertension: Secondary | ICD-10-CM

## 2019-12-30 MED ORDER — AMLODIPINE BESYLATE 10 MG PO TABS: 10.0000 mg | ORAL_TABLET | Freq: Every day | ORAL | 0 refills | Status: DC

## 2019-12-30 MED ORDER — CETIRIZINE HCL 10 MG PO TABS: 10.0000 mg | ORAL_TABLET | Freq: Every day | ORAL | 0 refills | Status: DC

## 2019-12-30 MED ORDER — LISINOPRIL-HYDROCHLOROTHIAZIDE 20-25 MG PO TABS: 1.0000 | ORAL_TABLET | Freq: Every day | ORAL | 0 refills | Status: DC

## 2019-12-30 NOTE — Telephone Encounter (Signed)
Patient made appointment with Autry-Lott on 03/102021 for PAP smear and blood work per Dr. Owens Shark. Patient needed late appointment time which PCP did not have available and prefers female provider.  Jillian Vasquez, [place]

## 2019-12-30 NOTE — Telephone Encounter (Signed)
Refilled medications for 30 days. Nursing- please call patient and help to schedule with me sometime this month or next---she is due for blood work.  Dorris Singh, MD  Family Medicine Teaching Service

## 2020-01-01 ENCOUNTER — Other Ambulatory Visit: Payer: Self-pay

## 2020-01-01 ENCOUNTER — Encounter: Payer: Self-pay | Admitting: Family Medicine

## 2020-01-01 ENCOUNTER — Ambulatory Visit (INDEPENDENT_AMBULATORY_CARE_PROVIDER_SITE_OTHER): Payer: Self-pay | Admitting: Family Medicine

## 2020-01-01 ENCOUNTER — Other Ambulatory Visit (HOSPITAL_COMMUNITY)
Admission: RE | Admit: 2020-01-01 | Discharge: 2020-01-01 | Disposition: A | Discharge status: Home / Self Care | Payer: Self-pay | Source: Ambulatory Visit | Attending: Family Medicine | Admitting: Family Medicine

## 2020-01-01 VITALS — BP 122/72 | HR 105 | Ht 61.0 in | Wt 189.0 lb

## 2020-01-01 DIAGNOSIS — Z124 Encounter for screening for malignant neoplasm of cervix: Secondary | ICD-10-CM

## 2020-01-01 DIAGNOSIS — N920 Excessive and frequent menstruation with regular cycle: Secondary | ICD-10-CM

## 2020-01-01 DIAGNOSIS — Z1151 Encounter for screening for human papillomavirus (HPV): Secondary | ICD-10-CM | POA: Insufficient documentation

## 2020-01-01 DIAGNOSIS — I10 Essential (primary) hypertension: Secondary | ICD-10-CM

## 2020-01-01 DIAGNOSIS — Z0000 Encounter for general adult medical examination without abnormal findings: Secondary | ICD-10-CM

## 2020-01-01 DIAGNOSIS — D649 Anemia, unspecified: Secondary | ICD-10-CM

## 2020-01-01 NOTE — Patient Instructions (Signed)
We will call you with your lab results.   Please call the clinic at [phone] if you have any concerns. It was our pleasure to serve you.

## 2020-01-01 NOTE — Progress Notes (Addendum)
    SUBJECTIVE:   CHIEF COMPLAINT / HPI:  Ms. Bobette Mo presents today for a pap smear and lab work for her hypertension.  Menorrhagia She experienced irregular cycles for 2 month, 4 months ago. Her cycle has been normal for the last 2 months but is now with heavier bleeding than before. She has a family history of fibroids but none personally that she knows of. Does not endorse shortness of breath or fatigue.  HTN Endorses compliance with medication but would like to decrease the use of some medications as she is worried about her kidney function. Does not endorse chest pain, changes in vision, or headache.  PERTINENT  PMH / PSH: Anemia, HTN, Obesity, Asthma  OBJECTIVE:   BP 122/72   Pulse (!) 105   Ht 5\' 1"  (1.549 m)   Wt 189 lb (85.7 kg)   SpO2 97%   BMI 35.71 kg/m   General: Appears well, no acute distress. Age appropriate. Cardiac: RRR, normal heart sounds, no murmurs Respiratory: end-expiratory wheezing bilaterally, normal effort Pelvic exam: normal external genitalia, vulva, vagina, cervix, uterus and adnexa PAP: Pap smear done today, exam chaperoned by Delray Alt  ASSESSMENT/PLAN:   HYPERTENSION, BENIGN SYSTEMIC Medication controlled. 122/72. Cr 1.32 increased from a baseline of 1 -Continue norvasc 10mg  -Stop zestoretic 20-25mg  -Start lisinopril 20mg  daily -follow up in 1 month for repeat BMET and BP check  Menorrhagia with regular cycle Etiology unknown at this time. No obvious fibroids found with bimanual exam. Hgb 8.2.  -Iron 325mg  PO daily -consider ultrasound -Pap done today -follow up as needed  Anemia Hgb 8.2. Likely due to menorrhagia. -Start iron 325 mg PO daily -Consider anemia panel -ED precautions given   Screening for malignant neoplasm of cervix Pap preformed today.  Healthcare maintenance -Pap smear -HCV -HIV  Gerlene Fee, [place]

## 2020-01-02 DIAGNOSIS — Z0000 Encounter for general adult medical examination without abnormal findings: Secondary | ICD-10-CM | POA: Insufficient documentation

## 2020-01-02 DIAGNOSIS — Z124 Encounter for screening for malignant neoplasm of cervix: Secondary | ICD-10-CM | POA: Insufficient documentation

## 2020-01-02 DIAGNOSIS — N920 Excessive and frequent menstruation with regular cycle: Secondary | ICD-10-CM | POA: Insufficient documentation

## 2020-01-02 LAB — HCV COMMENT:

## 2020-01-02 LAB — BASIC METABOLIC PANEL
BUN/Creatinine Ratio: 11 (ref 9–23)
BUN: 15 mg/dL (ref 6–24)
CO2: 23 mmol/L (ref 20–29)
Calcium: 9.5 mg/dL (ref 8.7–10.2)
Chloride: 100 mmol/L (ref 96–106)
Creatinine, Ser: 1.32 mg/dL — ABNORMAL HIGH (ref 0.57–1.00)
GFR calc Af Amer: 57 mL/min/{1.73_m2} — ABNORMAL LOW (ref >59)
GFR calc non Af Amer: 49 mL/min/{1.73_m2} — ABNORMAL LOW (ref >59)
Glucose: 91 mg/dL (ref 65–99)
Potassium: 4.1 mmol/L (ref 3.5–5.2)
Sodium: 138 mmol/L (ref 134–144)

## 2020-01-02 LAB — CBC WITH DIFFERENTIAL/PLATELET
Basophils Absolute: 0 10*3/uL (ref 0.0–0.2)
Basos: 1 %
EOS (ABSOLUTE): 0.9 10*3/uL — ABNORMAL HIGH (ref 0.0–0.4)
Eos: 11 %
Hematocrit: 29.2 % — ABNORMAL LOW (ref 34.0–46.6)
Hemoglobin: 8.2 g/dL — ABNORMAL LOW (ref 11.1–15.9)
Immature Grans (Abs): 0 10*3/uL (ref 0.0–0.1)
Immature Granulocytes: 0 %
Lymphocytes Absolute: 1.8 10*3/uL (ref 0.7–3.1)
Lymphs: 23 %
MCH: 17.8 pg — ABNORMAL LOW (ref 26.6–33.0)
MCHC: 28.1 g/dL — ABNORMAL LOW (ref 31.5–35.7)
MCV: 63 fL — ABNORMAL LOW (ref 79–97)
Monocytes Absolute: 1 10*3/uL — ABNORMAL HIGH (ref 0.1–0.9)
Monocytes: 13 %
Neutrophils Absolute: 4.2 10*3/uL (ref 1.4–7.0)
Neutrophils: 52 %
Platelets: 400 10*3/uL (ref 150–450)
RBC: 4.61 x10E6/uL (ref 3.77–5.28)
RDW: 19.7 % — ABNORMAL HIGH (ref 11.7–15.4)
WBC: 7.9 10*3/uL (ref 3.4–10.8)

## 2020-01-02 LAB — HEPATITIS C ANTIBODY (REFLEX): HCV Ab: <0.1 s/co ratio (ref 0.0–0.9)

## 2020-01-02 LAB — HIV ANTIBODY (ROUTINE TESTING W REFLEX): HIV Screen 4th Generation wRfx: NONREACTIVE

## 2020-01-02 MED ORDER — IRON (FERROUS SULFATE) 325 (65 FE) MG PO TABS: 325.0000 mg | ORAL_TABLET | Freq: Every day | ORAL | 3 refills | Status: DC

## 2020-01-02 MED ORDER — LISINOPRIL 20 MG PO TABS: 20.0000 mg | ORAL_TABLET | Freq: Every day | ORAL | 3 refills | Status: DC

## 2020-01-02 NOTE — Assessment & Plan Note (Signed)
Medication controlled. 122/72. Cr 1.32 increased from a baseline of 1 -Continue norvasc 10mg  -Stop zestoretic 20-25mg  -Start lisinopril 20mg  daily -follow up in 1 month for repeat BMET and BP check

## 2020-01-02 NOTE — Assessment & Plan Note (Signed)
-  Pap smear -HCV -HIV

## 2020-01-02 NOTE — Assessment & Plan Note (Signed)
Hgb 8.2. Likely due to menorrhagia. -Start iron 325 mg PO daily -Consider anemia panel -ED precautions given

## 2020-01-02 NOTE — Assessment & Plan Note (Signed)
Pap preformed today.

## 2020-01-02 NOTE — Assessment & Plan Note (Signed)
Etiology unknown at this time. No obvious fibroids found with bimanual exam. Hgb 8.2.  -Iron 325mg  PO daily -consider ultrasound -Pap done today -follow up as needed

## 2020-01-03 ENCOUNTER — Telehealth: Payer: Self-pay

## 2020-01-03 LAB — CYTOLOGY - PAP
Comment: NEGATIVE
Diagnosis: NEGATIVE
High risk HPV: NEGATIVE

## 2020-01-03 NOTE — Telephone Encounter (Signed)
Informed patient of results of PAP and told her to repeat in 3 years.  Patient verbalized understanding and appreciative.  Jillian Vasquez, [place]

## 2020-01-06 ENCOUNTER — Telehealth: Payer: Self-pay | Admitting: Family Medicine

## 2020-01-06 DIAGNOSIS — D649 Anemia, unspecified: Secondary | ICD-10-CM

## 2020-01-06 NOTE — Telephone Encounter (Signed)
Called patient to inform her of the need to get a colonoscopy to make sure this is not a source of her anemia. She voiced understanding.   Marien Manship Autry-Lott, DO

## 2020-01-15 ENCOUNTER — Telehealth: Payer: Self-pay | Admitting: Family Medicine

## 2020-01-15 NOTE — Telephone Encounter (Addendum)
Attempted to call patient to inform her that she can follow up in 5 years for a repeat Pap smear. HPV + Cytology negative. No answer. No VM left. Answering machine appeared to be a business phone.   Dr. Janus Molder, DO

## 2020-01-28 ENCOUNTER — Other Ambulatory Visit: Payer: Self-pay | Admitting: *Deleted

## 2020-01-28 DIAGNOSIS — I10 Essential (primary) hypertension: Secondary | ICD-10-CM

## 2020-01-28 MED ORDER — AMLODIPINE BESYLATE 10 MG PO TABS: 10.0000 mg | ORAL_TABLET | Freq: Every day | ORAL | 3 refills | Status: DC

## 2020-02-03 ENCOUNTER — Other Ambulatory Visit: Payer: Self-pay | Admitting: Family Medicine

## 2020-02-06 ENCOUNTER — Inpatient Hospital Stay: Admission: RE | Admit: 2020-02-06 | Payer: Self-pay | Source: Ambulatory Visit

## 2020-02-12 ENCOUNTER — Other Ambulatory Visit: Payer: Self-pay | Admitting: Family Medicine

## 2020-03-03 ENCOUNTER — Other Ambulatory Visit: Payer: Self-pay | Admitting: Family Medicine

## 2020-03-03 DIAGNOSIS — Z1231 Encounter for screening mammogram for malignant neoplasm of breast: Secondary | ICD-10-CM

## 2020-03-12 ENCOUNTER — Ambulatory Visit
Admission: RE | Admit: 2020-03-12 | Discharge: 2020-03-12 | Disposition: A | Discharge status: Home / Self Care | Payer: BC Managed Care – PPO | Source: Ambulatory Visit | Attending: Family Medicine | Admitting: Family Medicine

## 2020-03-12 ENCOUNTER — Other Ambulatory Visit: Payer: Self-pay

## 2020-03-12 DIAGNOSIS — Z1231 Encounter for screening mammogram for malignant neoplasm of breast: Secondary | ICD-10-CM

## 2020-03-15 ENCOUNTER — Encounter: Payer: Self-pay | Admitting: Family Medicine

## 2020-04-22 ENCOUNTER — Other Ambulatory Visit: Payer: Self-pay

## 2020-04-22 MED ORDER — MONTELUKAST SODIUM 10 MG PO TABS: 10.0000 mg | ORAL_TABLET | Freq: Every day | ORAL | 3 refills | Status: DC

## 2020-05-19 ENCOUNTER — Other Ambulatory Visit: Payer: Self-pay

## 2020-05-19 DIAGNOSIS — J301 Allergic rhinitis due to pollen: Secondary | ICD-10-CM

## 2020-05-19 MED ORDER — CETIRIZINE HCL 10 MG PO TABS: 10.0000 mg | ORAL_TABLET | Freq: Every day | ORAL | 0 refills | Status: DC

## 2020-06-18 ENCOUNTER — Other Ambulatory Visit: Payer: Self-pay

## 2020-06-18 DIAGNOSIS — J301 Allergic rhinitis due to pollen: Secondary | ICD-10-CM

## 2020-06-18 MED ORDER — CETIRIZINE HCL 10 MG PO TABS: 10.0000 mg | ORAL_TABLET | Freq: Every day | ORAL | 2 refills | Status: DC

## 2020-06-18 MED ORDER — ALBUTEROL SULFATE (2.5 MG/3ML) 0.083% IN NEBU: 2.5000 mg | INHALATION_SOLUTION | Freq: Four times a day (QID) | RESPIRATORY_TRACT | 1 refills | Status: DC | PRN

## 2020-06-23 ENCOUNTER — Ambulatory Visit (INDEPENDENT_AMBULATORY_CARE_PROVIDER_SITE_OTHER): Payer: BC Managed Care – PPO | Admitting: Family Medicine

## 2020-06-23 ENCOUNTER — Other Ambulatory Visit: Payer: Self-pay

## 2020-06-23 VITALS — BP 136/88 | HR 81 | Wt 178.4 lb

## 2020-06-23 DIAGNOSIS — R7989 Other specified abnormal findings of blood chemistry: Secondary | ICD-10-CM

## 2020-06-23 DIAGNOSIS — I10 Essential (primary) hypertension: Secondary | ICD-10-CM | POA: Diagnosis not present

## 2020-06-23 DIAGNOSIS — J4531 Mild persistent asthma with (acute) exacerbation: Secondary | ICD-10-CM

## 2020-06-23 DIAGNOSIS — J4520 Mild intermittent asthma, uncomplicated: Secondary | ICD-10-CM | POA: Diagnosis not present

## 2020-06-23 MED ORDER — ALBUTEROL SULFATE HFA 108 (90 BASE) MCG/ACT IN AERS: 2.0000 | INHALATION_SPRAY | RESPIRATORY_TRACT | 2 refills | Status: DC | PRN

## 2020-06-23 MED ORDER — BUDESONIDE-FORMOTEROL FUMARATE 80-4.5 MCG/ACT IN AERO: 2.0000 | INHALATION_SPRAY | Freq: Two times a day (BID) | RESPIRATORY_TRACT | 12 refills | Status: DC

## 2020-06-23 NOTE — Patient Instructions (Addendum)
It was very nice to see you today. Please enjoy the rest of your week. Today you were seen for asthma flare and we also checked your blood pressure.   I have prescribed Symbicort to be taken 2 times daily. Albuterol is only for an acute attack.  Please schedule an appointment with Dr. Valentina Lucks for pulmonary function tests   Follow up in 2-4 weeks for a follow up visit or sooner if needed.   Please call the clinic at [phone] if your symptoms worsen or you have any concerns. It was our pleasure to serve you.

## 2020-06-23 NOTE — Progress Notes (Signed)
    SUBJECTIVE:   CHIEF COMPLAINT / HPI:   [name] is a 44 yo F who present for the below issues  Asthma History of asthma since 2008, 2009.  Experiencing increased wheezing and shortness of breath since July of this year.  She is doing well today but endorses heat as a trigger.  She has associated symptoms of dry to wet cough producing some phlegm as well as sinusitis symptoms.  States she has a history of sinusitis as well as ENT surgery that has caused a loss of sense of smell.  She uses her inhaler twice daily as well as nebs daily.  She states she was not using any of these medications 2 months ago.  Hypertension - Medications: Amlodipine 10 mg daily, lisinopril 20 mg daily - Compliance: Yes - Checking BP at home: Occasionally - Denies any SOB, CP, vision changes, LE edema, medication SEs, or symptoms of hypotension - Exercise: Endorses weight loss from walking and light body weight exercises.  PERTINENT  PMH / PSH: Nasal sinus surgery 01/03/2019 due to chronic sinusitis  OBJECTIVE:   BP 136/88   Pulse 81   Wt 178 lb 6.4 oz (80.9 kg)   SpO2 99%   BMI 33.71 kg/m   General: Appears well, no acute distress. Age appropriate. Cardiac: RRR, normal heart sounds, no murmurs Respiratory: Inspiratory wheezing more appreciable in the upper lung fields.  No rhonchi rales.  Normal effort.  ASSESSMENT/PLAN:   Asthma Worsening due to heat trigger during summer months.  Non-smoking history. -Counseled on rescue inhaler as needed -Refill albuterol -Start Symbicort twice daily -Schedule PFTs with Dr. Valentina Lucks -Follow-up if symptoms fail to improve  HYPERTENSION, BENIGN SYSTEMIC BP well controlled. -Continue medications -Obtain repeat BMP   Gerlene Fee, [place]

## 2020-06-24 ENCOUNTER — Ambulatory Visit (INDEPENDENT_AMBULATORY_CARE_PROVIDER_SITE_OTHER): Payer: BC Managed Care – PPO | Admitting: Otolaryngology

## 2020-06-24 VITALS — Temp 97.7°F

## 2020-06-24 DIAGNOSIS — J339 Nasal polyp, unspecified: Secondary | ICD-10-CM | POA: Diagnosis not present

## 2020-06-24 DIAGNOSIS — R430 Anosmia: Secondary | ICD-10-CM | POA: Diagnosis not present

## 2020-06-24 LAB — BASIC METABOLIC PANEL
BUN/Creatinine Ratio: 13 (ref 9–23)
BUN: 13 mg/dL (ref 6–24)
CO2: 21 mmol/L (ref 20–29)
Calcium: 8.9 mg/dL (ref 8.7–10.2)
Chloride: 103 mmol/L (ref 96–106)
Creatinine, Ser: 1 mg/dL (ref 0.57–1.00)
GFR calc Af Amer: 80 mL/min/{1.73_m2} (ref >59)
GFR calc non Af Amer: 69 mL/min/{1.73_m2} (ref >59)
Glucose: 78 mg/dL (ref 65–99)
Potassium: 4.5 mmol/L (ref 3.5–5.2)
Sodium: 137 mmol/L (ref 134–144)

## 2020-06-24 NOTE — Progress Notes (Signed)
HPI: Jillian Vasquez is a 44 y.o. female who returns today for evaluation of severe decreased sense of smell.  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  She has done well with the surgery breathing better with less sinus problems but has lost her sense of smell.  She is breathing reasonably well today with no mucopurulent discharge noted. She does have history of asthma and uses inhalers for asthma.  Past Medical History:  Diagnosis Date  . Asthma   . Chronic sinusitis of both maxillary sinuses   . Headache disorder    "from my BP"  . Hypertension   . Hypertensive emergency without congestive heart failure 03/06/2012  . Sinus headache    Past Surgical History:  Procedure Laterality Date  . BARTHOLIN CYST MARSUPIALIZATION N/A 08/29/2014   Procedure: BARTHOLIN CYST MARSUPIALIZATION;  Surgeon: Osborne Oman, MD;  Location: [facility];  Service: Gynecology;  Laterality: N/A;  . CESAREAN SECTION  2000  . ETHMOIDECTOMY Bilateral 01/03/2019   Procedure: ETHMOIDECTOMY;  Surgeon: Rozetta Nunnery, MD;  Location: [facility];  Service: ENT;  Laterality: Bilateral;  . MAXILLARY ANTROSTOMY Bilateral 01/03/2019   Procedure: MAXILLARY ANTROSTOMY WITH REMOVAL OF TISSUE;  Surgeon: Rozetta Nunnery, MD;  Location: [facility];  Service: ENT;  Laterality: Bilateral;  . NASAL SINUS SURGERY Bilateral 01/03/2019   Procedure: ENDOSCOPIC SINUS SURGERY WITH SHENOIDOTOMY;  Surgeon: Rozetta Nunnery, MD;  Location: [facility];  Service: ENT;  Laterality: Bilateral;  . SINUS ENDO WITH FUSION Bilateral 01/03/2019   Procedure: SINUS ENDOSCOPY WITH FUSION NAVIGATION;  Surgeon: Rozetta Nunnery, MD;  Location: [facility];  Service: ENT;  Laterality: Bilateral;  . TUBAL LIGATION  2000  . TURBINATE REDUCTION Bilateral 01/03/2019   Procedure: TURBINATE  REDUCTION;  Surgeon: Rozetta Nunnery, MD;  Location: [facility];  Service: ENT;  Laterality: Bilateral;   Social History   Socioeconomic History  . Marital status: Single    Spouse name: Not on file  . Number of children: Not on file  . Years of education: Not on file  . Highest education level: Not on file  Occupational History  . Not on file  Tobacco Use  . Smoking status: Never Smoker  . Smokeless tobacco: Never Used  Vaping Use  . Vaping Use: Never used  Substance and Sexual Activity  . Alcohol use: Yes    Comment: rarely  . Drug use: No  . Sexual activity: Not on file  Other Topics Concern  . Not on file  Social History Narrative  . Not on file   Social Determinants of Health   Financial Resource Strain:   . Difficulty of Paying Living Expenses: Not on file  Food Insecurity:   . Worried About Charity fundraiser in the Last Year: Not on file  . Ran Out of Food in the Last Year: Not on file  Transportation Needs:   . Lack of Transportation (Medical): Not on file  . Lack of Transportation (Non-Medical): Not on file  Physical Activity:   . Days of Exercise per Week: Not on file  . Minutes of Exercise per Session: Not on file  Stress:   . Feeling of Stress : Not on file  Social Connections:   . Frequency of Communication with Friends and Family: Not on file  . Frequency of Social Gatherings with  Friends and Family: Not on file  . Attends Religious Services: Not on file  . Active Member of Clubs or Organizations: Not on file  . Attends Archivist Meetings: Not on file  . Marital Status: Not on file   Family History  Problem Relation Age of Onset  . Hypertension Mother   . Hypertension Father   . Hypertension Maternal Aunt   . Breast cancer Maternal Aunt   . Hypertension Maternal Uncle   . Breast cancer Maternal Grandmother    No Known Allergies Prior to Admission medications   Medication Sig Start Date End Date Taking?  Authorizing Provider  albuterol (PROVENTIL) (2.5 MG/3ML) 0.083% nebulizer solution Take 3 mLs (2.5 mg total) by nebulization every 6 (six) hours as needed for wheezing or shortness of breath. 06/18/20  Yes Martyn Malay, MD  albuterol (VENTOLIN HFA) 108 (90 Base) MCG/ACT inhaler Inhale 2 puffs into the lungs every 4 (four) hours as needed for wheezing or shortness of breath. 06/23/20  Yes Autry-Lott, Naaman Plummer, DO  amLODipine (NORVASC) 10 MG tablet Take 1 tablet (10 mg total) by mouth daily. 01/28/20  Yes Martyn Malay, MD  budesonide-formoterol [facility]) 80-4.5 MCG/ACT inhaler Inhale 2 puffs into the lungs in the morning and at bedtime. 06/23/20  Yes Autry-Lott, Naaman Plummer, DO  cetirizine (ZYRTEC) 10 MG tablet Take 1 tablet (10 mg total) by mouth daily. 06/18/20  Yes Martyn Malay, MD  Iron, Ferrous Sulfate, 325 (65 Fe) MG TABS Take 325 mg by mouth daily. 01/02/20  Yes Autry-Lott, Naaman Plummer, DO  lisinopril (ZESTRIL) 20 MG tablet Take 1 tablet (20 mg total) by mouth at bedtime. 01/02/20  Yes Autry-Lott, Naaman Plummer, DO  montelukast (SINGULAIR) 10 MG tablet Take 1 tablet (10 mg total) by mouth at bedtime. 04/22/20  Yes Martyn Malay, MD  Multiple Vitamin (MULTIVITAMIN WITH MINERALS) TABS tablet Take 1 tablet by mouth daily.   Yes [provider]     Positive ROS: Otherwise negative  All other systems have been reviewed and were otherwise negative with the exception of those mentioned in the HPI and as above.  Physical Exam: Constitutional: Alert, well-appearing, no acute distress Ears: External ears without lesions or tenderness. Ear canals are clear bilaterally with intact, clear TMs.  Nasal: External nose without lesions. Septum midline.  With moderate rhinitis and clear mucus discharge..  Nasal endoscopy was performed in the office on nasal endoscopy she has small residual polyps but the middle meatus regions are reasonably clear but edematous.  No mucopurulent discharge noted. Oral: Lips and gums  without lesions. Tongue and palate mucosa without lesions. Posterior oropharynx clear. Neck: No palpable adenopathy or masses Respiratory: Breathing comfortably  Skin: No facial/neck lesions or rash noted.  Nasal/sinus endoscopy  Date/Time: 06/24/2020 2:07 PM Performed by: Rozetta Nunnery, MD Authorized by: Rozetta Nunnery, MD   Consent:    Consent obtained:  Verbal   Consent given by:  Patient Procedure details:    Indications: sino-nasal symptoms     Medication:  Afrin   Instrument: flexible fiberoptic nasal endoscope     Scope location: bilateral nare   Septum:    normal   Sinus:    Right middle meatus: normal     polyps     Left middle meatus: normal     polyps   Comments:     On nasal endoscopy patient has small polyps on both sides but these are not totally obstructing.  Airway is otherwise patent.  No signs  of infection.    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  Plan: Placed her on a prednisone course for 9 days starting with 60 mg x 2 days, 50 mg x 2 days, 40 mg x 2 days, and then taper off over 3 days. Also prescribed XHANCE 2 sprays twice daily. Would recommend further evaluation with allergist and follow-up here in 2 months.   Radene Journey, MD

## 2020-06-25 ENCOUNTER — Encounter: Payer: Self-pay | Admitting: Family Medicine

## 2020-06-25 NOTE — Assessment & Plan Note (Signed)
BP well controlled. -Continue medications -Obtain repeat BMP

## 2020-06-25 NOTE — Assessment & Plan Note (Addendum)
Worsening due to heat trigger during summer months.  Non-smoking history. -Counseled on rescue inhaler as needed -Refill albuterol -Start Symbicort twice daily -Schedule PFTs with Dr. Valentina Lucks -Follow-up if symptoms fail to improve

## 2020-07-02 ENCOUNTER — Encounter: Payer: Self-pay | Admitting: Pharmacist

## 2020-07-02 ENCOUNTER — Other Ambulatory Visit: Payer: Self-pay

## 2020-07-02 ENCOUNTER — Ambulatory Visit (INDEPENDENT_AMBULATORY_CARE_PROVIDER_SITE_OTHER): Payer: BC Managed Care – PPO | Admitting: Pharmacist

## 2020-07-02 DIAGNOSIS — J4521 Mild intermittent asthma with (acute) exacerbation: Secondary | ICD-10-CM

## 2020-07-02 LAB — PULMONARY FUNCTION TEST

## 2020-07-02 NOTE — Patient Instructions (Addendum)
Nice meeting you today!  Continue Symbicort 2 puffs twice daily   Continue Singulair 10 mg daily at bedtime  Continue Xhance nasal spray  Use albuterol inhaler if you have trouble breathing, and if that doesn't work, then use the nebulizer

## 2020-07-02 NOTE — Assessment & Plan Note (Signed)
Spirometry evaluation with pre-bronchodilator revealed normal lung function. Post-nebulized albuterol treatment revealed reversible lung function with a 51.2% change in FEF257 and 12% increase in FEV1. Suspect improved breathing is due to current prednisone taper and addition of Xhance nasal spray. Advised patient to continue using Symbicort inhaler as maintenance therapy and albuterol as needed for shortness of breath.    -Continued Symbicort 80-4.5 mcg - 2 puffs BID -Continued albuterol PRN for shortness of breath -Continued Xhance nasal spray 93 mcg - 2 sprays BID -Educated patient on purpose, proper use, potential adverse effects including Symbicort risk of esophageal candidiasis and need to rinse mouth after each use.  -Reviewed results of pulmonary function tests.

## 2020-07-02 NOTE — Progress Notes (Signed)
° °  S:    Patient arrives in good spirits. Presents for lung function evaluation.   Patient was referred and last seen by Primary Care Provider on 06/23/20.  Asthma diagnosed in ~2009. 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 Denies shortness of breath and wheezing. Reports prior to visit with Dr. Lucia Gaskins, she was using albuterol twice daily. Patient denies atopic sx such as atopic dermatitis and allergic rhinitis.   Patient reports adherence to medications, however did not take Symbicort yesterday (9/8) Patient reports last dose of asthma medications was this morning (6AM) with exercise for albuterol. Did not take Symbicort this morning Current asthma medications: Albuterol PRN, Symbicort 80-4.5 mcg twice daily Rescue inhaler use frequency: once in the past week   Level of asthma sx control- in the last 4 weeks: Question Scoring Patient Score  Daytime sx more than twice a week Yes (1) No (0) Yes  Any nighttime waking due to asthma Yes (1) No (0) No  Reliever needed >2x/week Yes (1) No (0) Yes  Any activity limitation due to asthma Yes (1) No (0) No   Total Score 2  Well controlled - 0, partly controlled - 1-2, uncontrolled 3-4  O: Physical Exam Vitals reviewed.  Constitutional:      Appearance: Normal appearance.  Pulmonary:     Effort: Pulmonary effort is normal.  Neurological:     Mental Status: She is alert and oriented to person, place, and time.  Psychiatric:        Mood and Affect: Mood normal.        Behavior: Behavior normal.        Thought Content: Thought content normal.    Review of Systems  Respiratory: Negative.   All other systems reviewed and are negative.   Vitals:   07/02/20 1126  BP: 130/90  Pulse: 76  SpO2: 98%    See "scanned report" or Documentation Flowsheet  (discrete results - PFTs) for  Spirometry results. Patient provided good effort while attempting spirometry.   Lung Age = 64 Albuterol Neb  Lot# [id number]   Exp. 12/21  PHQ-9 Score: 6  A/P: Spirometry evaluation with pre-bronchodilator revealed normal lung function. Post-nebulized albuterol treatment revealed reversible lung function with a 51.2% change in FEF257 and 12% increase in FEV1. Suspect improved breathing is due to current prednisone taper and addition of Xhance nasal spray. Advised patient to continue using Symbicort inhaler as maintenance therapy and albuterol as needed for shortness of breath.    -Continued Symbicort 80-4.5 mcg - 2 puffs BID -Continued albuterol PRN for shortness of breath -Continued Xhance nasal spray 93 mcg - 2 sprays BID -Educated patient on purpose, proper use, potential adverse effects including Symbicort risk of esophageal candidiasis and need to rinse mouth after each use.  -Reviewed results of pulmonary function tests.   Patient verbalized understanding of results and education.  Written pt instructions provided.   F/U PCP visit on 10/1. Total time in face to face counseling 30 minutes.    Patient seen with Fara Olden, PharmD - PGY-1 Resident. and Lorel Monaco, PharmD, PGY2 Pharmacy Resident.   Marland Kitchen

## 2020-07-06 NOTE — Progress Notes (Signed)
Reviewed: I agree with Dr. Koval's documentation and management. 

## 2020-07-24 ENCOUNTER — Ambulatory Visit (INDEPENDENT_AMBULATORY_CARE_PROVIDER_SITE_OTHER): Payer: BC Managed Care – PPO | Admitting: Family Medicine

## 2020-07-24 ENCOUNTER — Other Ambulatory Visit: Payer: Self-pay

## 2020-07-24 ENCOUNTER — Encounter: Payer: Self-pay | Admitting: Family Medicine

## 2020-07-24 VITALS — BP 142/98 | HR 92 | Wt 181.8 lb

## 2020-07-24 DIAGNOSIS — N939 Abnormal uterine and vaginal bleeding, unspecified: Secondary | ICD-10-CM | POA: Diagnosis not present

## 2020-07-24 DIAGNOSIS — D509 Iron deficiency anemia, unspecified: Secondary | ICD-10-CM | POA: Diagnosis not present

## 2020-07-24 DIAGNOSIS — I10 Essential (primary) hypertension: Secondary | ICD-10-CM

## 2020-07-24 DIAGNOSIS — D500 Iron deficiency anemia secondary to blood loss (chronic): Secondary | ICD-10-CM

## 2020-07-24 DIAGNOSIS — L02419 Cutaneous abscess of limb, unspecified: Secondary | ICD-10-CM

## 2020-07-24 DIAGNOSIS — R0981 Nasal congestion: Secondary | ICD-10-CM

## 2020-07-24 MED ORDER — DOXYCYCLINE HYCLATE 100 MG PO TABS: 100.0000 mg | ORAL_TABLET | Freq: Two times a day (BID) | ORAL | 0 refills | Status: DC

## 2020-07-24 NOTE — Assessment & Plan Note (Signed)
Continue current medications. She is to send in values.

## 2020-07-24 NOTE — Progress Notes (Signed)
    SUBJECTIVE:   CHIEF COMPLAINT / HPI:   Jillian Vasquez is a pleasant 44 year old with history significant for HTN, bilateral tubal ligation and Bartholin's cyst presenting today for follow up.   Menses The patient reports years of heavy menses. Menses are irregular. She reports heavy flow for 5 day, changing pads every 30 minutes (on day 2, she changes pads this frequently--other days, less frequently). She also reports her 'Bartholin's cyst' recurs with menses. She is not on her menses today. Denies pica but endorses fatigue. Married, one partner, desires no further children. She is interested in definitive therapy.   Hypertension Reports compliance with medications. Home readings are 'much better'. She denies headache or chest pain. She reports this is due to pain under left arm.  Left Arm Issue Patient reports 2-3 days of pain and 'swelling' under left axilla. No fevers, drainage. She does not shave typically, no recent injury or activity. No nipple discharge or breast issues that she is aware of at this time. Had mammogram in May.    PERTINENT  PMH / PSH/Family/Social History : updated and reviewed. She owns a daycare-very stressful  Family history of Maternal aunt with breast cancer  OBJECTIVE:   BP (!) 142/98   Pulse 92   Wt 181 lb 12.8 oz (82.5 kg)   SpO2 98%   BMI 33.46 kg/m   HEENT: Sclera anicteric. Dentition is moderate. Appears well hydrated. Neck: Supple Cardiac: Regular rate and rhythm. Normal S1/S2. No murmurs, rubs, or gallops appreciated. Lungs: Clear bilaterally to ascultation.  Axillary: Indurated, mildly erythematous palpable nodule, most no fluctuance, confluent with lymph nodes No masses or LAD on right Chaperoned ([place])     ASSESSMENT/PLAN:   HYPERTENSION, BENIGN SYSTEMIC Continue current medications. She is to send in values.   Anemia Patient previously wanted virtual colonoscopy. Given age, anemia, and prior symptoms,  colonoscopy certainly reasonable, order placed.   Suspect in large part due to AUB. Referral to OB/Gyn as patient desires surgery therapy rather than medication or IUD.  CBC today    AUB- uncertain cause-primarily addressed abscess today. Consider pelvic ultrasound at follow up-patient prefers to see Gynecology as she is quite frustrated with continued heavy menses and what she feels is recurrence of cyst.   Left axillary furuncle, likely start of abscess, may require drainage. Also considered lymphadenopathy related to malignancy--however, this is painful and new. If this does not markedly improve, needs diagnostic mammogram. Discussed with patient--warm compresses + doxycycline X10 days. Discussed limiting sun exposure, do not take with milk/calcium.  Patient instructed to follow up next week for repeat exam (may need drained in 4-5 days). She would like to wait--will message me with improved. Discussed and reviewed return precautions.   HCM  Declined influenza vaccine today   Dorris Singh, MD  [facility]

## 2020-07-24 NOTE — Assessment & Plan Note (Signed)
Patient previously wanted virtual colonoscopy. Given age, anemia, and prior symptoms, colonoscopy certainly reasonable, order placed.   Suspect in large part due to AUB. Referral to OB/Gyn as patient desires surgery therapy rather than medication or IUD.  CBC today

## 2020-07-24 NOTE — Patient Instructions (Signed)
It was wonderful to see you today.  Please bring ALL of your medications with you to every visit.   Today we talked about:  --Warm compressed TWICE per day every single day  - Doxycycline twice per day---use sunscreen when you take this (predisposes to sunburns)  - I placed referral for a colonoscopy and the Gynecology team  - I will call you with results  - Message me about your arm PLEASE!   Thank you for choosing [facility].   Please call [phone] with any questions about today's appointment.  Please be sure to schedule follow up at the front  desk before you leave today.   Dorris Singh, MD  Family Medicine

## 2020-07-25 LAB — CBC
Hematocrit: 40.5 % (ref 34.0–46.6)
Hemoglobin: 12.5 g/dL (ref 11.1–15.9)
MCH: 24.9 pg — ABNORMAL LOW (ref 26.6–33.0)
MCHC: 30.9 g/dL — ABNORMAL LOW (ref 31.5–35.7)
MCV: 81 fL (ref 79–97)
Platelets: 220 10*3/uL (ref 150–450)
RBC: 5.03 x10E6/uL (ref 3.77–5.28)
RDW: 15.4 % (ref 11.7–15.4)
WBC: 5.3 10*3/uL (ref 3.4–10.8)

## 2020-07-27 ENCOUNTER — Telehealth: Payer: Self-pay

## 2020-07-27 ENCOUNTER — Telehealth: Payer: Self-pay | Admitting: Family Medicine

## 2020-07-27 DIAGNOSIS — R599 Enlarged lymph nodes, unspecified: Secondary | ICD-10-CM

## 2020-07-27 NOTE — Telephone Encounter (Signed)
LVM for patient to call [facility] for appointment.  When patient calls back please inform her of below appointment.  [place], suite [phone] 08/06/2020 1230 with arrival [address] card Photo ID Wear mask and loose clothing Do not wear deodorant, lotion or powder Please come alone to appointment  .Ozella Almond, CMA

## 2020-07-27 NOTE — Telephone Encounter (Signed)
Patient returned call to nurse line and informed of below.   Talbot Grumbling, RN

## 2020-07-27 NOTE — Telephone Encounter (Signed)
CBC is stable there is no leukocytosis.  Inquired about axillary adenopathy which was felt to be possible abscess. She reports intolerance to medication.  Warm compresses are helping.  Recommended discontinuing doxycycline given the symptoms.  To further evaluate.  Will obtain ultrasound.  Nursing-- can we please help patient ultrasound of  left axilla sometime this week? After 11 AM is best.   Thanks! Dorris Singh, MD  Family Medicine Teaching Service

## 2020-08-06 ENCOUNTER — Ambulatory Visit
Admission: RE | Admit: 2020-08-06 | Discharge: 2020-08-06 | Disposition: A | Discharge status: Home / Self Care | Payer: BC Managed Care – PPO | Source: Ambulatory Visit | Attending: Family Medicine | Admitting: Family Medicine

## 2020-08-06 ENCOUNTER — Other Ambulatory Visit: Payer: Self-pay | Admitting: Family Medicine

## 2020-08-06 ENCOUNTER — Other Ambulatory Visit: Payer: Self-pay

## 2020-08-06 DIAGNOSIS — R599 Enlarged lymph nodes, unspecified: Secondary | ICD-10-CM

## 2020-08-06 DIAGNOSIS — R922 Inconclusive mammogram: Secondary | ICD-10-CM | POA: Diagnosis not present

## 2020-08-06 DIAGNOSIS — N6489 Other specified disorders of breast: Secondary | ICD-10-CM | POA: Diagnosis not present

## 2020-08-07 ENCOUNTER — Telehealth: Payer: Self-pay | Admitting: Family Medicine

## 2020-08-07 DIAGNOSIS — D649 Anemia, unspecified: Secondary | ICD-10-CM

## 2020-08-07 MED ORDER — IRON (FERROUS SULFATE) 325 (65 FE) MG PO TABS: 325.0000 mg | ORAL_TABLET | Freq: Every day | ORAL | 3 refills | Status: DC

## 2020-08-07 NOTE — Telephone Encounter (Signed)
Called patient about mammogram/ultrasound. Reviewed next steps, provided supportive listening. All questions answered.  Dorris Singh, MD  Family Medicine Teaching Service

## 2020-08-18 ENCOUNTER — Ambulatory Visit
Admission: RE | Admit: 2020-08-18 | Discharge: 2020-08-18 | Disposition: A | Discharge status: Home / Self Care | Payer: BC Managed Care – PPO | Source: Ambulatory Visit | Attending: Family Medicine | Admitting: Family Medicine

## 2020-08-18 ENCOUNTER — Other Ambulatory Visit: Payer: Self-pay

## 2020-08-18 DIAGNOSIS — R928 Other abnormal and inconclusive findings on diagnostic imaging of breast: Secondary | ICD-10-CM | POA: Diagnosis not present

## 2020-08-18 DIAGNOSIS — R599 Enlarged lymph nodes, unspecified: Secondary | ICD-10-CM

## 2020-08-18 DIAGNOSIS — R921 Mammographic calcification found on diagnostic imaging of breast: Secondary | ICD-10-CM | POA: Diagnosis not present

## 2020-08-21 ENCOUNTER — Telehealth: Payer: Self-pay | Admitting: Family Medicine

## 2020-08-21 ENCOUNTER — Encounter: Payer: Self-pay | Admitting: Family Medicine

## 2020-08-21 DIAGNOSIS — R0683 Snoring: Secondary | ICD-10-CM

## 2020-08-21 NOTE — Telephone Encounter (Signed)
Called to follow-up on most recent results of mammogram, biopsy.  At the patient's last visit she had heavy menstrual bleeding and has had a history of significant anemia requiring iron supplementation.  As such she was referred to GI IN a [facility] gynecology.  She has not heard from either.  Messages sent via MyChart with numbers.  The patient also has a history of hypertension excessive daytime sleepiness and snoring.  Her husband has noted she is stops breathing at night at times.  We discussed sleep study at her prior visit but she had deferred at that time.  At this time she would like sleep study.  Referral placed.

## 2020-08-26 ENCOUNTER — Other Ambulatory Visit: Payer: Self-pay | Admitting: Family Medicine

## 2020-08-26 DIAGNOSIS — D649 Anemia, unspecified: Secondary | ICD-10-CM

## 2020-10-01 ENCOUNTER — Encounter: Payer: BC Managed Care – PPO | Admitting: Obstetrics and Gynecology

## 2020-10-15 ENCOUNTER — Other Ambulatory Visit: Payer: Self-pay

## 2020-10-15 DIAGNOSIS — J301 Allergic rhinitis due to pollen: Secondary | ICD-10-CM

## 2020-10-15 MED ORDER — CETIRIZINE HCL 10 MG PO TABS: 10.0000 mg | ORAL_TABLET | Freq: Every day | ORAL | 2 refills | Status: DC

## 2020-10-15 NOTE — Telephone Encounter (Signed)
Patient calls nurse line requesting refill on cetirizine and to report that she received insurance approval for sleep study.   Please advise next steps for patient.   Talbot Grumbling, RN

## 2020-10-29 ENCOUNTER — Ambulatory Visit: Payer: BC Managed Care – PPO

## 2020-11-04 ENCOUNTER — Ambulatory Visit (INDEPENDENT_AMBULATORY_CARE_PROVIDER_SITE_OTHER): Payer: BC Managed Care – PPO | Admitting: Otolaryngology

## 2020-11-04 ENCOUNTER — Other Ambulatory Visit: Payer: Self-pay

## 2020-11-04 ENCOUNTER — Encounter (INDEPENDENT_AMBULATORY_CARE_PROVIDER_SITE_OTHER): Payer: Self-pay | Admitting: Otolaryngology

## 2020-11-04 VITALS — Temp 96.8°F

## 2020-11-04 DIAGNOSIS — J339 Nasal polyp, unspecified: Secondary | ICD-10-CM

## 2020-11-04 DIAGNOSIS — J310 Chronic rhinitis: Secondary | ICD-10-CM

## 2020-11-04 NOTE — Progress Notes (Signed)
HPI: Jillian Vasquez is a 45 y.o. 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  She had recurrence with decreased sense of smell and was seen several months ago and treated with steroids and Xhance nasal spray.  She is doing much better today.  She has a referral to allergist for further allergy testing and treatment.  But her appointment is still pending.  Past Medical History:  Diagnosis Date  . Asthma   . Chronic sinusitis of both maxillary sinuses   . Headache disorder    "from my BP"  . Hypertension    Past Surgical History:  Procedure Laterality Date  . BARTHOLIN CYST MARSUPIALIZATION N/A 08/29/2014   Procedure: BARTHOLIN CYST MARSUPIALIZATION;  Surgeon: Osborne Oman, MD;  Location: [facility];  Service: Gynecology;  Laterality: N/A;  . CESAREAN SECTION  2000  . ETHMOIDECTOMY Bilateral 01/03/2019   Procedure: ETHMOIDECTOMY;  Surgeon: Rozetta Nunnery, MD;  Location: [facility];  Service: ENT;  Laterality: Bilateral;  . MAXILLARY ANTROSTOMY Bilateral 01/03/2019   Procedure: MAXILLARY ANTROSTOMY WITH REMOVAL OF TISSUE;  Surgeon: Rozetta Nunnery, MD;  Location: [facility];  Service: ENT;  Laterality: Bilateral;  . NASAL SINUS SURGERY Bilateral 01/03/2019   Procedure: ENDOSCOPIC SINUS SURGERY WITH SHENOIDOTOMY;  Surgeon: Rozetta Nunnery, MD;  Location: [facility];  Service: ENT;  Laterality: Bilateral;  . SINUS ENDO WITH FUSION Bilateral 01/03/2019   Procedure: SINUS ENDOSCOPY WITH FUSION NAVIGATION;  Surgeon: Rozetta Nunnery, MD;  Location: [facility];  Service: ENT;  Laterality: Bilateral;  . TUBAL LIGATION  2000  . TURBINATE REDUCTION Bilateral 01/03/2019   Procedure: TURBINATE REDUCTION;  Surgeon: Rozetta Nunnery, MD;  Location: [facility];  Service: ENT;  Laterality: Bilateral;    Social History   Socioeconomic History  . Marital status: Single    Spouse name: Not on file  . Number of children: Not on file  . Years of education: Not on file  . Highest education level: Not on file  Occupational History  . Not on file  Tobacco Use  . Smoking status: Never Smoker  . Smokeless tobacco: Never Used  Vaping Use  . Vaping Use: Never used  Substance and Sexual Activity  . Alcohol use: Yes    Comment: rarely  . Drug use: No  . Sexual activity: Not on file  Other Topics Concern  . Not on file  Social History Narrative  . Not on file   Social Determinants of Health   Financial Resource Strain: Not on file  Food Insecurity: Not on file  Transportation Needs: Not on file  Physical Activity: Not on file  Stress: Not on file  Social Connections: Not on file   Family History  Problem Relation Age of Onset  . Hypertension Mother   . Hypertension Father   . Hypertension Maternal Aunt   . Breast cancer Maternal Aunt   . Hypertension Maternal Uncle   . Breast cancer Maternal Grandmother    No Known Allergies Prior to Admission medications   Medication Sig Start Date End Date Taking? Authorizing Provider  albuterol (PROVENTIL) (2.5 MG/3ML) 0.083% nebulizer solution Take 3 mLs (2.5 mg total) by nebulization every 6 (six) hours as needed for wheezing or shortness of breath. 06/18/20   Martyn Malay, MD  albuterol (VENTOLIN HFA) 108 (90 Base) MCG/ACT inhaler Inhale 2  puffs into the lungs every 4 (four) hours as needed for wheezing or shortness of breath. Patient not taking: Reported on 07/02/2020 06/23/20   Autry-Lott, Naaman Plummer, DO  amLODipine (NORVASC) 10 MG tablet Take 1 tablet (10 mg total) by mouth daily. 01/28/20   Martyn Malay, MD  budesonide-formoterol [facility]) 80-4.5 MCG/ACT inhaler Inhale 2 puffs into the lungs in the morning and at bedtime. 06/23/20   Autry-Lott, Naaman Plummer, DO  cetirizine (ZYRTEC) 10 MG tablet Take 1 tablet (10 mg total) by mouth daily.  10/15/20   Martyn Malay, MD  doxycycline (VIBRA-TABS) 100 MG tablet Take 1 tablet (100 mg total) by mouth 2 (two) times daily. 07/24/20   Martyn Malay, MD  FEROSUL 325 (65 Fe) MG tablet TAKE 1 TABLET BY MOUTH DAILY 08/27/20   Martyn Malay, MD  Fluticasone Propionate Truett Perna) 93 MCG/ACT EXHU Place 93 mcg into the nose in the morning and at bedtime. 2 sprays twice daily    [provider]  lisinopril (ZESTRIL) 20 MG tablet Take 1 tablet (20 mg total) by mouth at bedtime. 01/02/20   Autry-Lott, Naaman Plummer, DO  montelukast (SINGULAIR) 10 MG tablet Take 1 tablet (10 mg total) by mouth at bedtime. 04/22/20   Martyn Malay, MD  Multiple Vitamin (MULTIVITAMIN WITH MINERALS) TABS tablet Take 1 tablet by mouth daily.    [provider]     Positive ROS: Otherwise negative  All other systems have been reviewed and were otherwise negative with the exception of those mentioned in the HPI and as above.  Physical Exam: Constitutional: Alert, well-appearing, no acute distress Ears: External ears without lesions or tenderness. Ear canals are clear bilaterally with intact, clear TMs.  Nasal: External nose without lesions. Septum with slight deviation..  She has small polyps on both sides within the middle meatus and medial to the middle turbinate but they are nonobstructing.  She has no clinical evidence of mucopurulent discharge. Oral: Lips and gums without lesions. Tongue and palate mucosa without lesions. Posterior oropharynx clear. Neck: No palpable adenopathy or masses Respiratory: Breathing comfortably  Skin: No facial/neck lesions or rash noted.  Procedures  Assessment: History of sinonasal polyps and allergies.  Doing better presently using Xhance.  Plan: Recommended regular use of Xhance.  She will follow-up in 4 to 6 months for recheck. Hopefully she will have her appointment with allergist concerning further evaluation and treatment.  She might be a candidate for use of  Dexilant.   Radene Journey, MD

## 2020-12-15 ENCOUNTER — Other Ambulatory Visit: Payer: Self-pay

## 2020-12-15 ENCOUNTER — Telehealth: Payer: Self-pay

## 2020-12-15 ENCOUNTER — Ambulatory Visit (HOSPITAL_COMMUNITY)
Admission: RE | Admit: 2020-12-15 | Discharge: 2020-12-15 | Disposition: A | Discharge status: Home / Self Care | Payer: BC Managed Care – PPO | Source: Ambulatory Visit | Attending: Family Medicine | Admitting: Family Medicine

## 2020-12-15 ENCOUNTER — Ambulatory Visit: Payer: BC Managed Care – PPO | Admitting: Family Medicine

## 2020-12-15 VITALS — BP 132/78 | HR 84 | Ht 61.0 in | Wt 191.2 lb

## 2020-12-15 DIAGNOSIS — R0789 Other chest pain: Secondary | ICD-10-CM | POA: Insufficient documentation

## 2020-12-15 DIAGNOSIS — I10 Essential (primary) hypertension: Secondary | ICD-10-CM

## 2020-12-15 NOTE — Assessment & Plan Note (Signed)
Patient presents with left pectoral discomfort that appears to be associated with left shoulder pain and intermittent tightness.  Patient has no red flag symptoms to indicate a cardiac etiology of pain.  Pain has no difference on palpation.  She has no shortness of breath or GI upset when pain occurs.  Risk factors include hypertension and mother with myocardial infarction. -EKG completed, no signs of ischemia -BMP -CBC, check for signs of anemia -Counseled patient on relaxation techniques, massage, use a heating pad in NSAIDs -Due to increased risk levels, recommended patient speak with Dr. Hartford Poli for limited counseling sessions, patient agreeable to this

## 2020-12-15 NOTE — Telephone Encounter (Signed)
Patient calls nurse line reporting chest pains x3 days. Patients denies any SOB, left arm weakness, dizziness, or nausea vomitting. Patient does have a history of asthma and feels the discomfort is related to this. Patient scheduled for this afternoon. Patient reports she will try to make this apt, however her work schedule may conflict. Patient advised to call our office to cancel if she can not make this apt.

## 2020-12-15 NOTE — Progress Notes (Signed)
    SUBJECTIVE:   CHIEF COMPLAINT / HPI: chest pain   Patient reports chest pain for 3 days that has been going on for ~1 year intermittently.   Pain is localized to left lateral pectoral region and sometimes travels to her left shoulder. Sometimes has left shoulder pain/tightness with tingling in her left hand. Episodes occur throughout the day and last 5-10 minutes.  Discomfort does not appear to be related to any activities.  Patient has tried sleeping with a heating pad on her back the past few nights.  She does not experience any arm weakness.  She reports no episodes of feeling some numbness in her fingers on the left hand.  Patient reports some concern as her mother had a heart attack at age 34.  Patient denies any other family members with early onset cardiac disease.  PERTINENT  PMH / PSH:  IDA  HTN  Asthma  Recurrent sinusitis, sinus surgery   OBJECTIVE:   BP 132/78   Pulse 84   Ht 5\' 1"  (1.549 m)   Wt 191 lb 3.2 oz (86.7 kg)   SpO2 96%   BMI 36.13 kg/m   General: female appearing stated age in no acute distress HEENT: MMM, no oral lesions noted,Neck non-tender without lymphadenopathy, masses  Cardio: Normal S1 and S2, no S3 or S4. Rhythm is regular. No murmurs or rubs.  Bilateral radial pulses palpable Pulm: Clear to auscultation bilaterally, no crackles, wheezing, or diminished breath sounds. Normal respiratory effort Abdomen: Bowel sounds normal. Abdomen soft and non-tender.  Extremities: No peripheral edema. Warm/ well perfused.  ASSESSMENT/PLAN:   Chest wall pain Patient presents with left pectoral discomfort that appears to be associated with left shoulder pain and intermittent tightness.  Patient has no red flag symptoms to indicate a cardiac etiology of pain.  Pain has no difference on palpation.  She has no shortness of breath or GI upset when pain occurs.  Risk factors include hypertension and mother with myocardial infarction. -EKG completed, no signs of  ischemia -BMP -CBC, check for signs of anemia -Counseled patient on relaxation techniques, massage, use a heating pad in NSAIDs -Due to increased risk levels, recommended patient speak with Dr. Hartford Poli for limited counseling sessions, patient agreeable to this    Eulis Foster, MD [facility]

## 2020-12-15 NOTE — Patient Instructions (Signed)
We will we have completed an EKG that did not show any abnormalities regarding the electric conductivity of your heart.  We will collect blood work.  I will notify you of any abnormal results.  Please follow-up with [place] in 1 week.   I also recommend that you continue to use a heating pad and complete stretches to help with your musculoskeletal pain on that side of your chest.  I have also sent your information to Dr. Oren Binet in order to set up sessions for therapy.  Please be on the look out for a call from her in regards to scheduling.    Shoulder Exercises Ask your health care provider which exercises are safe for you. Do exercises exactly as told by your health care provider and adjust them as directed. It is normal to feel mild stretching, pulling, tightness, or discomfort as you do these exercises. Stop right away if you feel sudden pain or your pain gets worse. Do not begin these exercises until told by your health care provider. Stretching exercises External rotation and abduction This exercise is sometimes called corner stretch. This exercise rotates your arm outward (external rotation) and moves your arm out from your body (abduction). 1. Stand in a doorway with one of your feet slightly in front of the other. This is called a staggered stance. If you cannot reach your forearms to the door frame, stand facing a corner of a room. 2. Choose one of the following positions as told by your health care provider: ? Place your hands and forearms on the door frame above your head. ? Place your hands and forearms on the door frame at the height of your head. ? Place your hands on the door frame at the height of your elbows. 3. Slowly move your weight onto your front foot until you feel a stretch across your chest and in the front of your shoulders. Keep your head and chest upright and keep your abdominal muscles tight. 4. Hold for __________ seconds. 5. To release the stretch, shift your weight  to your back foot. Repeat __________ times. Complete this exercise __________ times a day.   Extension, standing 1. Stand and hold a broomstick, a cane, or a similar object behind your back. ? Your hands should be a little wider than shoulder width apart. ? Your palms should face away from your back. 2. Keeping your elbows straight and your shoulder muscles relaxed, move the stick away from your body until you feel a stretch in your shoulders (extension). ? Avoid shrugging your shoulders while you move the stick. Keep your shoulder blades tucked down toward the middle of your back. 3. Hold for __________ seconds. 4. Slowly return to the starting position. Repeat __________ times. Complete this exercise __________ times a day. Range-of-motion exercises Pendulum 1. Stand near a wall or a surface that you can hold onto for balance. 2. Bend at the waist and let your left / right arm hang straight down. Use your other arm to support you. Keep your back straight and do not lock your knees. 3. Relax your left / right arm and shoulder muscles, and move your hips and your trunk so your left / right arm swings freely. Your arm should swing because of the motion of your body, not because you are using your arm or shoulder muscles. 4. Keep moving your hips and trunk so your arm swings in the following directions, as told by your health care provider: ? Side to side. ?  Forward and backward. ? In clockwise and counterclockwise circles. 5. Continue each motion for __________ seconds, or for as long as told by your health care provider. 6. Slowly return to the starting position. Repeat __________ times. Complete this exercise __________ times a day.   Shoulder flexion, standing 1. Stand and hold a broomstick, a cane, or a similar object. Place your hands a little more than shoulder width apart on the object. Your left / right hand should be palm up, and your other hand should be palm down. 2. Keep your  elbow straight and your shoulder muscles relaxed. Push the stick up with your healthy arm to raise your left / right arm in front of your body, and then over your head until you feel a stretch in your shoulder (flexion). ? Avoid shrugging your shoulder while you raise your arm. Keep your shoulder blade tucked down toward the middle of your back. 3. Hold for __________ seconds. 4. Slowly return to the starting position. Repeat __________ times. Complete this exercise __________ times a day.   Shoulder abduction, standing 1. Stand and hold a broomstick, a cane, or a similar object. Place your hands a little more than shoulder width apart on the object. Your left / right hand should be palm up, and your other hand should be palm down. 2. Keep your elbow straight and your shoulder muscles relaxed. Push the object across your body toward your left / right side. Raise your left / right arm to the side of your body (abduction) until you feel a stretch in your shoulder. ? Do not raise your arm above shoulder height unless your health care provider tells you to do that. ? If directed, raise your arm over your head. ? Avoid shrugging your shoulder while you raise your arm. Keep your shoulder blade tucked down toward the middle of your back. 3. Hold for __________ seconds. 4. Slowly return to the starting position. Repeat __________ times. Complete this exercise __________ times a day. Internal rotation 1. Place your left / right hand behind your back, palm up. 2. Use your other hand to dangle an exercise band, a towel, or a similar object over your shoulder. Grasp the band with your left / right hand so you are holding on to both ends. 3. Gently pull up on the band until you feel a stretch in the front of your left / right shoulder. The movement of your arm toward the center of your body is called internal rotation. ? Avoid shrugging your shoulder while you raise your arm. Keep your shoulder blade tucked  down toward the middle of your back. 4. Hold for __________ seconds. 5. Release the stretch by letting go of the band and lowering your hands. Repeat __________ times. Complete this exercise __________ times a day.   Strengthening exercises External rotation 1. Sit in a stable chair without armrests. 2. Secure an exercise band to a stable object at elbow height on your left / right side. 3. Place a soft object, such as a folded towel or a small pillow, between your left / right upper arm and your body to move your elbow about 4 inches (10 cm) away from your side. 4. Hold the end of the exercise band so it is tight and there is no slack. 5. Keeping your elbow pressed against the soft object, slowly move your forearm out, away from your abdomen (external rotation). Keep your body steady so only your forearm moves. 6. Hold for __________ seconds.  7. Slowly return to the starting position. Repeat __________ times. Complete this exercise __________ times a day.   Shoulder abduction 1. Sit in a stable chair without armrests, or stand up. 2. Hold a __________ weight in your left / right hand, or hold an exercise band with both hands. 3. Start with your arms straight down and your left / right palm facing in, toward your body. 4. Slowly lift your left / right hand out to your side (abduction). Do not lift your hand above shoulder height unless your health care provider tells you that this is safe. ? Keep your arms straight. ? Avoid shrugging your shoulder while you do this movement. Keep your shoulder blade tucked down toward the middle of your back. 5. Hold for __________ seconds. 6. Slowly lower your arm, and return to the starting position. Repeat __________ times. Complete this exercise __________ times a day.   Shoulder extension 1. Sit in a stable chair without armrests, or stand up. 2. Secure an exercise band to a stable object in front of you so it is at shoulder height. 3. Hold one end of  the exercise band in each hand. Your palms should face each other. 4. Straighten your elbows and lift your hands up to shoulder height. 5. Step back, away from the secured end of the exercise band, until the band is tight and there is no slack. 6. Squeeze your shoulder blades together as you pull your hands down to the sides of your thighs (extension). Stop when your hands are straight down by your sides. Do not let your hands go behind your body. 7. Hold for __________ seconds. 8. Slowly return to the starting position. Repeat __________ times. Complete this exercise __________ times a day. Shoulder row 1. Sit in a stable chair without armrests, or stand up. 2. Secure an exercise band to a stable object in front of you so it is at waist height. 3. Hold one end of the exercise band in each hand. Position your palms so that your thumbs are facing the ceiling (neutral position). 4. Bend each of your elbows to a 90-degree angle (right angle) and keep your upper arms at your sides. 5. Step back until the band is tight and there is no slack. 6. Slowly pull your elbows back behind you. 7. Hold for __________ seconds. 8. Slowly return to the starting position. Repeat __________ times. Complete this exercise __________ times a day. Shoulder press-ups 1. Sit in a stable chair that has armrests. Sit upright, with your feet flat on the floor. 2. Put your hands on the armrests so your elbows are bent and your fingers are pointing forward. Your hands should be about even with the sides of your body. 3. Push down on the armrests and use your arms to lift yourself off the chair. Straighten your elbows and lift yourself up as much as you comfortably can. ? Move your shoulder blades down, and avoid letting your shoulders move up toward your ears. ? Keep your feet on the ground. As you get stronger, your feet should support less of your body weight as you lift yourself up. 4. Hold for __________  seconds. 5. Slowly lower yourself back into the chair. Repeat __________ times. Complete this exercise __________ times a day.   Wall push-ups 1. Stand so you are facing a stable wall. Your feet should be about one arm-length away from the wall. 2. Lean forward and place your palms on the wall at shoulder height.  3. Keep your feet flat on the floor as you bend your elbows and lean forward toward the wall. 4. Hold for __________ seconds. 5. Straighten your elbows to push yourself back to the starting position. Repeat __________ times. Complete this exercise __________ times a day.   This information is not intended to replace advice given to you by your health care provider. Make sure you discuss any questions you have with your health care provider. Document Revised: 02/01/2019 Document Reviewed: 11/09/2018 Elsevier Patient Education  2021 [employer].

## 2020-12-16 LAB — BASIC METABOLIC PANEL
BUN/Creatinine Ratio: 12 (ref 9–23)
BUN: 13 mg/dL (ref 6–24)
CO2: 24 mmol/L (ref 20–29)
Calcium: 9.3 mg/dL (ref 8.7–10.2)
Chloride: 100 mmol/L (ref 96–106)
Creatinine, Ser: 1.05 mg/dL — ABNORMAL HIGH (ref 0.57–1.00)
GFR calc Af Amer: 75 mL/min/{1.73_m2} (ref >59)
GFR calc non Af Amer: 65 mL/min/{1.73_m2} (ref >59)
Glucose: 87 mg/dL (ref 65–99)
Potassium: 3.7 mmol/L (ref 3.5–5.2)
Sodium: 138 mmol/L (ref 134–144)

## 2020-12-16 LAB — CBC
Hematocrit: 36.9 % (ref 34.0–46.6)
Hemoglobin: 11.1 g/dL (ref 11.1–15.9)
MCH: 23.1 pg — ABNORMAL LOW (ref 26.6–33.0)
MCHC: 30.1 g/dL — ABNORMAL LOW (ref 31.5–35.7)
MCV: 77 fL — ABNORMAL LOW (ref 79–97)
Platelets: 312 10*3/uL (ref 150–450)
RBC: 4.8 x10E6/uL (ref 3.77–5.28)
RDW: 15.1 % (ref 11.7–15.4)
WBC: 7.4 10*3/uL (ref 3.4–10.8)

## 2020-12-16 LAB — FERRITIN: Ferritin: 6 ng/mL — ABNORMAL LOW (ref 15–150)

## 2020-12-24 ENCOUNTER — Other Ambulatory Visit: Payer: Self-pay

## 2020-12-24 ENCOUNTER — Ambulatory Visit: Payer: BC Managed Care – PPO | Admitting: Psychology

## 2020-12-24 DIAGNOSIS — F4323 Adjustment disorder with mixed anxiety and depressed mood: Secondary | ICD-10-CM | POA: Diagnosis not present
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
## 2020-12-25 ENCOUNTER — Ambulatory Visit (HOSPITAL_BASED_OUTPATIENT_CLINIC_OR_DEPARTMENT_OTHER): Payer: BC Managed Care – PPO | Attending: Family Medicine | Admitting: Internal Medicine

## 2020-12-25 VITALS — Ht 61.0 in | Wt 170.0 lb

## 2020-12-25 DIAGNOSIS — Z79899 Other long term (current) drug therapy: Secondary | ICD-10-CM | POA: Diagnosis not present

## 2020-12-25 DIAGNOSIS — R0683 Snoring: Secondary | ICD-10-CM | POA: Diagnosis not present

## 2020-12-25 DIAGNOSIS — G4733 Obstructive sleep apnea (adult) (pediatric): Secondary | ICD-10-CM

## 2021-01-02 DIAGNOSIS — R0683 Snoring: Secondary | ICD-10-CM | POA: Diagnosis not present

## 2021-01-02 NOTE — Procedures (Signed)
Patient Name: Jillian Vasquez, Jillian Vasquez Study Date: 54/27/0623 Gender: Female D.O.B: [birth year] Age (years): 45 Referring Provider: Martyn Malay Height (inches): 61 Interpreting Physician: Baird Lyons MD, ABSM Weight (lbs): 170 RPSGT: Baxter Flattery BMI: 32 MRN: [id number] Neck Size: 16.00  CLINICAL INFORMATION Sleep Study Type: Split Night CPAP Indication for sleep study: Fatigue, Obesity, Snoring, Witnessed Apneas Epworth Sleepiness Score: 19  SLEEP STUDY TECHNIQUE As per the AASM Manual for the Scoring of Sleep and Associated Events v2.3 (April 2016) with a hypopnea requiring 4% desaturations.  The channels recorded and monitored were frontal, central and occipital EEG, electrooculogram (EOG), submentalis EMG (chin), nasal and oral airflow, thoracic and abdominal wall motion, anterior tibialis EMG, snore microphone, electrocardiogram, and pulse oximetry. Continuous positive airway pressure (CPAP) was initiated when the patient met split night criteria and was titrated according to treat sleep-disordered breathing.  MEDICATIONS Medications self-administered by patient taken the night of the study : MONTELUKAST, LISINOPRIL  RESPIRATORY PARAMETERS Diagnostic  Total AHI (/hr): 39.2 RDI (/hr): 39.6 OA Index (/hr): 12.6 CA Index (/hr): 0.0 REM AHI (/hr): 63.3 NREM AHI (/hr): 36.5 Supine AHI (/hr): 49.2 Non-supine AHI (/hr): 29.1 Min O2 Sat (%): 81.0 Mean O2 (%): 94.6 Time below 88% (min): 2.3   Titration  Optimal Pressure (cm): 13 AHI at Optimal Pressure (/hr): 4.2 Min O2 at Optimal Pressure (%): 93.0 Supine % at Optimal (%): 100 Sleep % at Optimal (%): 97   SLEEP ARCHITECTURE The recording time for the entire night was 417.9 minutes.  During a baseline period of 201.8 minutes, the patient slept for 175.8 minutes in REM and nonREM, yielding a sleep efficiency of 87.1%%. Sleep onset after lights out was 21.5 minutes with a REM latency of 73.0 minutes. The patient spent  1.7%% of the night in stage N1 sleep, 88.1%% in stage N2 sleep, 0.0%% in stage N3 and 10.2% in REM.  During the titration period of 210.3 minutes, the patient slept for 199.5 minutes in REM and nonREM, yielding a sleep efficiency of 94.9%%. Sleep onset after CPAP initiation was 4.4 minutes with a REM latency of 12.0 minutes. The patient spent 4.0%% of the night in stage N1 sleep, 60.2%% in stage N2 sleep, 0.0%% in stage N3 and 35.8% in REM.  CARDIAC DATA The 2 lead EKG demonstrated sinus rhythm. The mean heart rate was 100.0 beats per minute. Other EKG findings include: None.  LEG MOVEMENT DATA The total Periodic Limb Movements of Sleep (PLMS) were 0. The PLMS index was 0.0 .  IMPRESSIONS - Severe obstructive sleep apnea occurred during the diagnostic portion of the study (AHI = 39.2/hour). An optimal PAP pressure was selected for this patient ( 13 cm of water) - No significant central sleep apnea occurred during the diagnostic portion of the study (CAI = 0.0/hour). - Moderate oxygen desaturation was noted during the diagnostic portion of the study (Min O2 =81.0%). Minimum O2 saturation on CPAP 13 was 93%. - Snoring was audible during the diagnostic portion of the study. - No cardiac abnormalities were noted during this study. - Clinically significant periodic limb movements did not occur during sleep.  DIAGNOSIS - Obstructive Sleep Apnea (G47.33)  RECOMMENDATIONS - Trial of CPAP therapy on 13 cm H2O or autopap 5-15. - Patient used a Small size Fisher&Paykel Full Face Mask Simplus mask and heated humidification. - Be careful with alcohol, sedatives and other CNS depressants that may worsen sleep apnea and disrupt normal sleep architecture. - Sleep hygiene should be reviewed to assess factors  that may improve sleep quality. - Weight management and regular exercise should be initiated or continued. - Consultation is available.  [Electronically signed] 01/02/2021 11:12 AM  Baird Lyons  MD, ABSM Diplomate, American Board of Sleep Medicine   NPI: [id number]                          [place] [place] [facility]  ELECTRONICALLY SIGNED ON:  01/02/2021, 11:08 AM [place] PH: [phone]   FX: [phone] [place]

## 2021-01-04 ENCOUNTER — Telehealth: Payer: Self-pay | Admitting: Family Medicine

## 2021-01-04 DIAGNOSIS — G4733 Obstructive sleep apnea (adult) (pediatric): Secondary | ICD-10-CM | POA: Insufficient documentation

## 2021-01-04 NOTE — Telephone Encounter (Signed)
Community message sent to [employer] for CPAP machine. Will await response.   Talbot Grumbling, RN

## 2021-01-04 NOTE — Telephone Encounter (Signed)
Called with results, discussed CPAP, ordered CPAP. Patient reports she much prefers nasal pillows vs. Mask, specified in order comments  Dorris Singh, MD  [facility] Medicine Teaching Service

## 2021-01-05 NOTE — Telephone Encounter (Signed)
Received below message from [employer].   Received, Thank you!     Ahniyah Giancola C Kijana Cromie, RN  

## 2021-01-06 ENCOUNTER — Ambulatory Visit: Payer: BC Managed Care – PPO | Admitting: Psychology

## 2021-01-25 ENCOUNTER — Telehealth: Payer: Self-pay | Admitting: Family Medicine

## 2021-01-25 DIAGNOSIS — D649 Anemia, unspecified: Secondary | ICD-10-CM

## 2021-01-25 MED ORDER — FERROUS SULFATE 325 (65 FE) MG PO TABS: 325.0000 mg | ORAL_TABLET | Freq: Every day | ORAL | 3 refills | Status: DC

## 2021-01-25 MED ORDER — MONTELUKAST SODIUM 10 MG PO TABS
10.0000 mg | ORAL_TABLET | Freq: Every day | ORAL | 3 refills | Status: DC
Start: 2021-01-25 — End: 2021-11-08

## 2021-01-25 NOTE — Telephone Encounter (Signed)
Called to remind to obtain mammogram--patient to schedule.   Dorris Singh, MD  Family Medicine Teaching Service

## 2021-02-03 ENCOUNTER — Other Ambulatory Visit: Payer: Self-pay

## 2021-02-03 DIAGNOSIS — J301 Allergic rhinitis due to pollen: Secondary | ICD-10-CM

## 2021-02-03 MED ORDER — CETIRIZINE HCL 10 MG PO TABS: 10.0000 mg | ORAL_TABLET | Freq: Every day | ORAL | 2 refills | Status: DC

## 2021-02-15 ENCOUNTER — Other Ambulatory Visit: Payer: Self-pay | Admitting: *Deleted

## 2021-02-15 DIAGNOSIS — I10 Essential (primary) hypertension: Secondary | ICD-10-CM

## 2021-02-15 MED ORDER — AMLODIPINE BESYLATE 10 MG PO TABS: 10.0000 mg | ORAL_TABLET | Freq: Every day | ORAL | 0 refills | Status: DC

## 2021-03-09 ENCOUNTER — Other Ambulatory Visit: Payer: Self-pay | Admitting: Family Medicine

## 2021-03-09 DIAGNOSIS — I10 Essential (primary) hypertension: Secondary | ICD-10-CM

## 2021-03-16 ENCOUNTER — Encounter: Payer: Self-pay | Admitting: Gastroenterology

## 2021-04-05 ENCOUNTER — Other Ambulatory Visit: Payer: Self-pay | Admitting: Family Medicine

## 2021-04-05 DIAGNOSIS — Z09 Encounter for follow-up examination after completed treatment for conditions other than malignant neoplasm: Secondary | ICD-10-CM

## 2021-04-13 ENCOUNTER — Encounter: Payer: Self-pay | Admitting: Gastroenterology

## 2021-04-13 ENCOUNTER — Ambulatory Visit (INDEPENDENT_AMBULATORY_CARE_PROVIDER_SITE_OTHER): Payer: BC Managed Care – PPO | Admitting: Gastroenterology

## 2021-04-13 VITALS — BP 110/70 | HR 74 | Ht 61.0 in | Wt 188.4 lb

## 2021-04-13 DIAGNOSIS — D509 Iron deficiency anemia, unspecified: Secondary | ICD-10-CM

## 2021-04-13 DIAGNOSIS — R059 Cough, unspecified: Secondary | ICD-10-CM | POA: Diagnosis not present

## 2021-04-13 MED ORDER — SUTAB 1479-225-188 MG PO TABS: ORAL_TABLET | ORAL | 0 refills | Status: DC

## 2021-04-13 NOTE — Progress Notes (Signed)
9[birth year]3875 Jillian Vasquez Jillian Vasquez [id number] [birth year]   HISTORY OF PRESENT ILLNESS: This is a 45 year old female who is new to our office.  She has been referred here by her PCP, Dr. Owens Shark, for microcytic anemia.  This referral was actually placed back in October 2021.  She denies any sign of rectal bleeding.  She says that she attributes her anemia to very heavy menses that she was having previously.  She has been on oral iron supplementations at bedtime and most recent hemoglobin back in February had normalized.  Ferritin was still very low though.  She says that she is due to have repeat blood work done again by her PCP soon.  She says that overall she is feeling better and does not have the urge to eat ice all the time.  She was interested in colonoscopy because she just had one of her best friends pass away from colon cancer in her early 39s.  She also reports that she has been having issues with an ongoing cough.  She does have underlying asthma.  She admits to occasional heartburn if she lies down too soon after eating.  Does not feel like heartburn or reflux is a regular occurrence.   Past Medical History:  Diagnosis Date   Asthma    Chronic sinusitis of both maxillary sinuses    Headache disorder    "from my BP"   Hypertension    Past Surgical History:  Procedure Laterality Date   BARTHOLIN CYST MARSUPIALIZATION N/A 08/29/2014   Procedure: BARTHOLIN CYST MARSUPIALIZATION;  Surgeon: Osborne Oman, MD;  Location: [facility];  Service: Gynecology;  Laterality: N/A;   CESAREAN SECTION  2000   ETHMOIDECTOMY Bilateral 01/03/2019   Procedure: ETHMOIDECTOMY;  Surgeon: Rozetta Nunnery, MD;  Location: [facility];  Service: ENT;  Laterality: Bilateral;   MAXILLARY ANTROSTOMY Bilateral 01/03/2019   Procedure: MAXILLARY ANTROSTOMY WITH REMOVAL OF TISSUE;  Surgeon: Rozetta Nunnery, MD;  Location: [facility];  Service: ENT;  Laterality:  Bilateral;   NASAL SINUS SURGERY Bilateral 01/03/2019   Procedure: ENDOSCOPIC SINUS SURGERY WITH SHENOIDOTOMY;  Surgeon: Rozetta Nunnery, MD;  Location: [facility];  Service: ENT;  Laterality: Bilateral;   SINUS ENDO WITH FUSION Bilateral 01/03/2019   Procedure: SINUS ENDOSCOPY WITH FUSION NAVIGATION;  Surgeon: Rozetta Nunnery, MD;  Location: [facility];  Service: ENT;  Laterality: Bilateral;   TUBAL LIGATION  2000   TURBINATE REDUCTION Bilateral 01/03/2019   Procedure: TURBINATE REDUCTION;  Surgeon: Rozetta Nunnery, MD;  Location: [facility];  Service: ENT;  Laterality: Bilateral;    reports that she has never smoked. She has never used smokeless tobacco. She reports current alcohol use. She reports that she does not use drugs. family history includes Breast cancer in her maternal aunt and maternal grandmother; Hypertension in her father, maternal aunt, maternal uncle, and mother. No Known Allergies    Outpatient Encounter Medications as of 04/13/2021  Medication Sig   albuterol (PROVENTIL) (2.5 MG/3ML) 0.083% nebulizer solution Take 3 mLs (2.5 mg total) by nebulization every 6 (six) hours as needed for wheezing or shortness of breath.   albuterol (VENTOLIN HFA) 108 (90 Base) MCG/ACT inhaler Inhale 2 puffs into the lungs every 4 (four) hours as needed for wheezing or shortness of breath.   amLODipine (NORVASC) 10 MG tablet Take 1 tablet (10 mg total) by mouth daily. Please schedule follow up with PCP prior to next  refill   budesonide-formoterol (SYMBICORT) 80-4.5 MCG/ACT inhaler Inhale 2 puffs into the lungs in the morning and at bedtime.   cetirizine (ZYRTEC) 10 MG tablet Take 1 tablet (10 mg total) by mouth daily.   ferrous sulfate (FEROSUL) 325 (65 FE) MG tablet Take 1 tablet (325 mg total) by mouth daily.   Fluticasone Propionate (XHANCE) 93 MCG/ACT EXHU Place 93 mcg into the nose in the morning and at bedtime. 2 sprays twice daily    lisinopril (ZESTRIL) 20 MG tablet TAKE 1 TABLET(20 MG) BY MOUTH AT BEDTIME   montelukast (SINGULAIR) 10 MG tablet Take 1 tablet (10 mg total) by mouth at bedtime.   Multiple Vitamin (MULTIVITAMIN WITH MINERALS) TABS tablet Take 1 tablet by mouth daily.   No facility-administered encounter medications on file as of 04/13/2021.     REVIEW OF SYSTEMS  : All other systems reviewed and negative except where noted in the History of Present Illness.   PHYSICAL EXAM: BP 110/70   Pulse 74   Ht 5\' 1"  (1.549 m)   Wt 188 lb 6.4 oz (85.5 kg)   BMI 35.60 kg/m  General: Well developed AA female in no acute distress Head: Normocephalic and atraumatic Eyes:  Sclerae anicteric, conjunctiva pink. Ears: Normal auditory acuity Lungs: Clear throughout to auscultation; no W/R/R. Heart: Regular rate and rhythm; no M/R/G. Abdomen: Soft, non-distended.  BS present.  Non-tender. Rectal:  Will be done at the time of colonoscopy. Musculoskeletal: Symmetrical with no gross deformities  Skin: No lesions on visible extremities Extremities: No edema  Neurological: Alert oriented x 4, grossly non-focal Psychological:  Alert and cooperative. Normal mood and affect  ASSESSMENT AND PLAN: *IDA: On oral iron supplements daily.  Hemoglobin actually normalized on last labs in February, but ferritin still very low.  She says she is due for some updated blood work with her PCP soon.  She is physically feeling better.  She attributes the anemia to heavy menses that she was having previously.  She denies any overt GI bleeding.  Stools have not been hemocculted.  She is interested in colonoscopy just to be cautious as one of her best friends just passed away from colon cancer in her 91s.  Will schedule with Dr. Carlean Purl.  The risks, benefits, and alternatives to colonoscopy were discussed with the patient and she consents to proceed.  *Ongoing cough: She does have history of asthma, but is also on lisinopril.  Has occasional  heartburn if she lies down shortly after eating.  We discussed trying not to lie down within 2 to 3 hours after eating.  Certainly can use Pepcid at bedtime if needed.  She will discuss with her PCP about the lisinopril and see if she can be taken off of that to see if that will allow for improvement in her coughing issues.  CC:  Martyn Malay, MD

## 2021-04-13 NOTE — Patient Instructions (Signed)
You have been scheduled for a colonoscopy. Please follow written instructions given to you at your visit today.  Please pick up your prep supplies at the pharmacy within the next 1-3 days. If you use inhalers (even only as needed), please bring them with you on the day of your procedure.  If you are age 44 or younger, your body mass index should be between 19-25. Your Body mass index is 35.6 kg/m. If this is out of the aformentioned range listed, please consider follow up with your Primary Care Provider.   _________________________________________________________  The [place] [facility] providers would like to encourage you to use [facility] to communicate with providers for non-urgent requests or questions.  Due to long hold times on the telephone, sending your provider a message by [facility] may be a faster and more efficient way to get a response.  Please allow 48 business hours for a response.  Please remember that this is for non-urgent requests.   Due to recent changes in healthcare laws, you may see the results of your imaging and laboratory studies on MyChart before your provider has had a chance to review them.  We understand that in some cases there may be results that are confusing or concerning to you. Not all laboratory results come back in the same time frame and the provider may be waiting for multiple results in order to interpret others.  Please give [place] 48 hours in order for your provider to thoroughly review all the results before contacting the office for clarification of your results.

## 2021-04-16 ENCOUNTER — Encounter: Payer: Self-pay | Admitting: Internal Medicine

## 2021-04-23 ENCOUNTER — Ambulatory Visit (AMBULATORY_SURGERY_CENTER): Payer: BC Managed Care – PPO | Admitting: Internal Medicine

## 2021-04-23 ENCOUNTER — Telehealth (INDEPENDENT_AMBULATORY_CARE_PROVIDER_SITE_OTHER): Payer: BC Managed Care – PPO | Admitting: Family Medicine

## 2021-04-23 ENCOUNTER — Encounter: Payer: Self-pay | Admitting: Internal Medicine

## 2021-04-23 ENCOUNTER — Other Ambulatory Visit: Payer: Self-pay

## 2021-04-23 VITALS — BP 150/90 | HR 84 | Temp 97.3°F | Resp 19 | Ht 61.0 in | Wt 188.0 lb

## 2021-04-23 DIAGNOSIS — J329 Chronic sinusitis, unspecified: Secondary | ICD-10-CM

## 2021-04-23 DIAGNOSIS — G4733 Obstructive sleep apnea (adult) (pediatric): Secondary | ICD-10-CM

## 2021-04-23 DIAGNOSIS — I10 Essential (primary) hypertension: Secondary | ICD-10-CM

## 2021-04-23 DIAGNOSIS — Z1211 Encounter for screening for malignant neoplasm of colon: Secondary | ICD-10-CM | POA: Diagnosis not present

## 2021-04-23 DIAGNOSIS — D509 Iron deficiency anemia, unspecified: Secondary | ICD-10-CM | POA: Diagnosis not present

## 2021-04-23 HISTORY — PX: COLONOSCOPY WITH PROPOFOL: SHX5780

## 2021-04-23 MED ORDER — LOSARTAN POTASSIUM 50 MG PO TABS: 50.0000 mg | ORAL_TABLET | Freq: Every day | ORAL | 3 refills | Status: DC

## 2021-04-23 MED ORDER — SODIUM CHLORIDE 0.9 % IV SOLN: 500.0000 mL | Freq: Once | INTRAVENOUS | Status: DC

## 2021-04-23 NOTE — Telephone Encounter (Signed)
Community message sent to [employer]. Will await response.   Lando Alcalde C Ishia Tenorio, RN  

## 2021-04-23 NOTE — Assessment & Plan Note (Signed)
Recommend follow up for exam. Discussed limitations of evaluating cough over phone.  Stop Lisinopril Start ARB Repeat BMP at follow up Discussed return precautions for cough

## 2021-04-23 NOTE — Patient Instructions (Addendum)
The colonoscopy was normal!  Next routine colonoscopy or other screening test in 10 years - 2032.  I appreciate the opportunity to care for you. Gatha Mayer, MD, [facility]  Continue your normal medications  YOU HAD AN ENDOSCOPIC PROCEDURE TODAY AT [place]:   Refer to the procedure report that was given to you for any specific questions about what was found during the examination.  If the procedure report does not answer your questions, please call your gastroenterologist to clarify.  If you requested that your care partner not be given the details of your procedure findings, then the procedure report has been included in a sealed envelope for you to review at your convenience later.  YOU SHOULD EXPECT: Some feelings of bloating in the abdomen. Passage of more gas than usual.  Walking can help get rid of the air that was put into your GI tract during the procedure and reduce the bloating. If you had a lower endoscopy (such as a colonoscopy or flexible sigmoidoscopy) you may notice spotting of blood in your stool or on the toilet paper. If you underwent a bowel prep for your procedure, you may not have a normal bowel movement for a few days.  Please Note:  You might notice some irritation and congestion in your nose or some drainage.  This is from the oxygen used during your procedure.  There is no need for concern and it should clear up in a day or so.  SYMPTOMS TO REPORT IMMEDIATELY:  Following lower endoscopy (colonoscopy or flexible sigmoidoscopy):  Excessive amounts of blood in the stool  Significant tenderness or worsening of abdominal pains  Swelling of the abdomen that is new, acute  Fever of 100F or higher  For urgent or emergent issues, a gastroenterologist can be reached at any hour by calling [phone]. Do not use MyChart messaging for urgent concerns.    DIET:  We do recommend a small meal at first, but then you may proceed to your regular diet.  Drink  plenty of fluids but you should avoid alcoholic beverages for 24 hours.  ACTIVITY:  You should plan to take it easy for the rest of today and you should NOT DRIVE or use heavy machinery until tomorrow (because of the sedation medicines used during the test).    FOLLOW UP: Our staff will call the number listed on your records 48-72 hours following your procedure to check on you and address any questions or concerns that you may have regarding the information given to you following your procedure. If we do not reach you, we will leave a message.  We will attempt to reach you two times.  During this call, we will ask if you have developed any symptoms of COVID 19. If you develop any symptoms (ie: fever, flu-like symptoms, shortness of breath, cough etc.) before then, please call [phone].  If you test positive for Covid 19 in the 2 weeks post procedure, please call and report this information to [place].    SIGNATURES/CONFIDENTIALITY: You and/or your care partner have signed paperwork which will be entered into your electronic medical record.  These signatures attest to the fact that that the information above on your After Visit Summary has been reviewed and is understood.  Full responsibility of the confidentiality of this discharge information lies with you and/or your care-partner.

## 2021-04-23 NOTE — Progress Notes (Signed)
Vitals-CW  History reviewed. 

## 2021-04-23 NOTE — Assessment & Plan Note (Signed)
Tolerating CPAP well. Recommend continuing current therapy given significant benefit.

## 2021-04-23 NOTE — Telephone Encounter (Signed)
[facility] Tele[facility]icine Visit  Patient consented to have virtual visit and was identified by name and date of birth. Method of visit: Telephone  Encounter participants: Patient: Jillian Vasquez - located at home Provider: Martyn Malay - located at [facility]    Chief Complaint: CPAP questions and dry cough   HPI: OSA Patient reports nightly compliance with CPAP. Patient uses nasal pillow with CPAP setting as AutoPap. She has no side effects. She reports she feels 'very good' using it and gets better rest. She feels 'more energized'.  She uses 7-8 hours per night each day of the week (7 days).  No headaches, no epistaxis.  CPAP settings: Autopap    Hypertension: Patient reports new cough with lisinopril. Dry in nature. No dyspnea, chest pain. Would like to switch medications.   Patient has a history of recurrent sinusitis. She regularly sees ENT. Has to have ABX 2-3 or more per year. No symptoms today. She would like allergy testing per ENT to identify triggers  ROS: per HPI  Pertinent PMHx: Reviewed, has CSY today   Talkative, speaking in full sentences   Assessment/Plan:  Obstructive sleep apnea Tolerating CPAP well. Recommend continuing current therapy given significant benefit.   HYPERTENSION, BENIGN SYSTEMIC Recommend follow up for exam. Discussed limitations of evaluating cough over phone.  Stop Lisinopril Start ARB Repeat BMP at follow up Discussed return precautions for cough  Called pharmacy to DC ACE inhibitor    Recurrent Sinusitis referral to Allergy per request for recurrent symptoms.  Reviewed return precautions  Time spent during visit with patient: 11 minutes

## 2021-04-23 NOTE — Op Note (Signed)
[place] Patient Name: Jillian Vasquez Procedure Date: 04/23/2021 9:41 AM MRN: [id number] Endoscopist: Gatha Mayer , MD Age: 45 Referring MD:  Date of Birth: 07-Jun-1976 Gender: Female Account #: [id number] Procedure:                Colonoscopy Indications:              Iron deficiency anemia Medicines:                Propofol per Anesthesia, Monitored Anesthesia Care Procedure:                Pre-Anesthesia Assessment:                           - Prior to the procedure, a History and Physical                            was performed, and patient medications and                            allergies were reviewed. The patient's tolerance of                            previous anesthesia was also reviewed. The risks                            and benefits of the procedure and the sedation                            options and risks were discussed with the patient.                            All questions were answered, and informed consent                            was obtained. Prior Anticoagulants: The patient has                            taken no previous anticoagulant or antiplatelet                            agents. ASA Grade Assessment: II - A patient with                            mild systemic disease. After reviewing the risks                            and benefits, the patient was deemed in                            satisfactory condition to undergo the procedure.                           After obtaining informed consent, the colonoscope  was passed under direct vision. Throughout the                            procedure, the patient's blood pressure, pulse, and                            oxygen saturations were monitored continuously. The                            Olympus PCF-H190DL (#[id number]) Colonoscope was                            introduced through the anus and advanced to the the                             cecum, identified by appendiceal orifice and                            ileocecal valve. The colonoscopy was performed                            without difficulty. The patient tolerated the                            procedure well. The quality of the bowel                            preparation was excellent. The ileocecal valve,                            appendiceal orifice, and rectum were photographed.                            The bowel preparation used was SuTab via split dose                            instruction. Scope In: 9:57:02 AM Scope Out: 10:07:39 AM Scope Withdrawal Time: 0 hours 7 minutes 32 seconds  Total Procedure Duration: 0 hours 10 minutes 37 seconds  Findings:                 The perianal and digital rectal examinations were                            normal.                           The entire examined colon appeared normal on direct                            and retroflexion views. Complications:            No immediate complications. Estimated Blood Loss:     Estimated blood loss: none. Impression:               - The entire examined colon is normal on direct  and                            retroflexion views. Iron deficiency must have been                            from menstrual losses.                           - No specimens collected. Recommendation:           - Patient has a contact number available for                            emergencies. The signs and symptoms of potential                            delayed complications were discussed with the                            patient. Return to normal activities tomorrow.                            Written discharge instructions were provided to the                            patient.                           - Resume previous diet.                           - Continue present medications.                           - Repeat colonoscopy in 10 years for screening                             purposes. Gatha Mayer, MD 04/23/2021 10:14:00 AM This report has been signed electronically.

## 2021-04-23 NOTE — Progress Notes (Signed)
pt tolerated well. VSS. awake and to recovery. Report given to RN.  

## 2021-04-23 NOTE — Progress Notes (Signed)
1032- pt up to bathroom now to try to pass air.  She had been drowsy while in the RR and passed small amount of air.  She rates abdominal cramping as a "4."  Abdomen distended.  She states "I always have trouble passing air- I had this same issue after my pregnancies."  1040- Levsin 0.125 mg SL 2 tablets given.  Pt ambulated twice around the nurse's station and back in the bathroom at this time.    1050- rating abdominal cramping as a "3."  She is able to belch and states some relief from that.   Pt on hands and knees,rocking onto heels.  She is able to pass a large amount of air.  Rates discomfort as a "1/" now.  States she wants to go home.  Abdomen soft

## 2021-04-23 NOTE — Telephone Encounter (Signed)
Received the following message from [employer].   Noted. Patient was setup with new cpap on 04-08-21 @ 9am   Thank you,   Demetrius Charity

## 2021-04-28 ENCOUNTER — Telehealth: Payer: Self-pay | Admitting: *Deleted

## 2021-04-28 NOTE — Telephone Encounter (Signed)
  Follow up Call-  Call back number 04/23/2021  Post procedure Call Back phone  # [phone]  Permission to leave phone message Yes  Some recent data might be hidden     Patient questions:  Do you have a fever, pain , or abdominal swelling? No. Pain Score  0 *  Have you tolerated food without any problems? Yes.    Have you been able to return to your normal activities? Yes.    Do you have any questions about your discharge instructions: Diet   No. Medications  No. Follow up visit  No.  Do you have questions or concerns about your Care? No.  Actions: * If pain score is 4 or above: No action needed, pain <4.  Have you developed a fever since your procedure? no  2.   Have you had an respiratory symptoms (SOB or cough) since your procedure? no  3.   Have you tested positive for COVID 19 since your procedure no  4.   Have you had any family members/close contacts diagnosed with the COVID 19 since your procedure?  no   If yes to any of these questions please route to Joylene John, RN and Joella Prince, RN

## 2021-05-08 DIAGNOSIS — G4733 Obstructive sleep apnea (adult) (pediatric): Secondary | ICD-10-CM | POA: Diagnosis not present

## 2021-05-11 DIAGNOSIS — G4733 Obstructive sleep apnea (adult) (pediatric): Secondary | ICD-10-CM | POA: Diagnosis not present

## 2021-05-14 ENCOUNTER — Ambulatory Visit
Admission: EM | Admit: 2021-05-14 | Discharge: 2021-05-14 | Disposition: A | Discharge status: Home / Self Care | Payer: BC Managed Care – PPO | Attending: Student | Admitting: Student

## 2021-05-14 ENCOUNTER — Other Ambulatory Visit: Payer: Self-pay

## 2021-05-14 ENCOUNTER — Encounter: Payer: Self-pay | Admitting: Emergency Medicine

## 2021-05-14 DIAGNOSIS — Z20822 Contact with and (suspected) exposure to covid-19: Secondary | ICD-10-CM | POA: Diagnosis not present

## 2021-05-14 DIAGNOSIS — J069 Acute upper respiratory infection, unspecified: Secondary | ICD-10-CM

## 2021-05-14 DIAGNOSIS — J4540 Moderate persistent asthma, uncomplicated: Secondary | ICD-10-CM

## 2021-05-14 DIAGNOSIS — Z1152 Encounter for screening for COVID-19: Secondary | ICD-10-CM

## 2021-05-14 MED ORDER — BENZONATATE 100 MG PO CAPS: 100.0000 mg | ORAL_CAPSULE | Freq: Three times a day (TID) | ORAL | 0 refills | Status: DC

## 2021-05-14 MED ORDER — PREDNISONE 20 MG PO TABS: 40.0000 mg | ORAL_TABLET | Freq: Every day | ORAL | 0 refills | Status: AC

## 2021-05-14 MED ORDER — PROMETHAZINE-DM 6.25-15 MG/5ML PO SYRP: 5.0000 mL | ORAL_SOLUTION | Freq: Four times a day (QID) | ORAL | 0 refills | Status: DC | PRN

## 2021-05-14 NOTE — ED Triage Notes (Signed)
Pt here with nasal congestion and body aches since Tuesday. Works at [employer]. Denies sore throat or fever.

## 2021-05-14 NOTE — ED Provider Notes (Signed)
[facility]    CSN: [id number] Arrival date & time: 05/14/21  [id number]      History   Chief Complaint Chief Complaint  Patient presents with   Nasal Congestion   Generalized Body Aches    HPI Jillian Vasquez is a 45 y.o. female presenting with nasal congestion and generalized body aches for 4 days. Works at [employer]. Daughter has already tested negative for covid and strep. Medical history asthma (singulair, symbicort, albuterol), hypertension, allergies (singulair). Cough is productive of yellow sputum. Inhalers providing relief. Nasal congestion but no facial pressure. Denies fever/chills, chest pain, shortness of breath, dizziness.  HPI  Past Medical History:  Diagnosis Date   Asthma    Chronic sinusitis of both maxillary sinuses    Headache disorder    "from my BP"   Hypertension     Patient Active Problem List   Diagnosis Date Noted   Cough 04/13/2021   Obstructive sleep apnea 01/04/2021   Chest wall pain 12/15/2020   Nasal congestion 09/04/2019   Asthma 01/02/2013   Anemia 03/15/2012   Overweight(278.02) 05/21/2009   HYPERTENSION, BENIGN SYSTEMIC 12/21/2006    Past Surgical History:  Procedure Laterality Date   BARTHOLIN CYST MARSUPIALIZATION N/A 08/29/2014   Procedure: BARTHOLIN CYST MARSUPIALIZATION;  Surgeon: Osborne Oman, MD;  Location: [facility];  Service: Gynecology;  Laterality: N/A;   CESAREAN SECTION  10/24/1998   ETHMOIDECTOMY Bilateral 01/03/2019   Procedure: ETHMOIDECTOMY;  Surgeon: Rozetta Nunnery, MD;  Location: [place];  Service: ENT;  Laterality: Bilateral;   LIVER BIOPSY     MAXILLARY ANTROSTOMY Bilateral 01/03/2019   Procedure: MAXILLARY ANTROSTOMY WITH REMOVAL OF TISSUE;  Surgeon: Rozetta Nunnery, MD;  Location: [place];  Service: ENT;  Laterality: Bilateral;   NASAL SINUS SURGERY Bilateral 01/03/2019   Procedure: ENDOSCOPIC SINUS SURGERY WITH SHENOIDOTOMY;  Surgeon:  Rozetta Nunnery, MD;  Location: [place];  Service: ENT;  Laterality: Bilateral;   SINUS ENDO WITH FUSION Bilateral 01/03/2019   Procedure: SINUS ENDOSCOPY WITH FUSION NAVIGATION;  Surgeon: Rozetta Nunnery, MD;  Location: [place];  Service: ENT;  Laterality: Bilateral;   TUBAL LIGATION  10/24/1998   TURBINATE REDUCTION Bilateral 01/03/2019   Procedure: TURBINATE REDUCTION;  Surgeon: Rozetta Nunnery, MD;  Location: [place];  Service: ENT;  Laterality: Bilateral;    OB History     Gravida  2   Para  2   Term  2   Preterm      AB  0   Living  3      SAB  0   IAB  0   Ectopic      Multiple  1   Live Births               Home Medications    Prior to Admission medications   Medication Sig Start Date End Date Taking? Authorizing Provider  benzonatate (TESSALON) 100 MG capsule Take 1 capsule (100 mg total) by mouth every 8 (eight) hours. 05/14/21  Yes Hazel Sams, PA-C  predniSONE (DELTASONE) 20 MG tablet Take 2 tablets (40 mg total) by mouth daily for 5 days. 05/14/21 05/19/21 Yes Hazel Sams, PA-C  promethazine-dextromethorphan (PROMETHAZINE-DM) 6.25-15 MG/5ML syrup Take 5 mLs by mouth 4 (four) times daily as needed for cough. 05/14/21  Yes Hazel Sams, PA-C  albuterol (PROVENTIL) (2.5 MG/3ML) 0.083% nebulizer solution Take 3 mLs (2.5 mg total) by  nebulization every 6 (six) hours as needed for wheezing or shortness of breath. 06/18/20   Martyn Malay, MD  albuterol (VENTOLIN HFA) 108 (90 Base) MCG/ACT inhaler Inhale 2 puffs into the lungs every 4 (four) hours as needed for wheezing or shortness of breath. 06/23/20   Autry-Lott, Naaman Plummer, DO  amLODipine (NORVASC) 10 MG tablet Take 1 tablet (10 mg total) by mouth daily. Please schedule follow up with PCP prior to next refill 02/15/21   Martyn Malay, MD  budesonide-formoterol [facility]) 80-4.5 MCG/ACT inhaler Inhale 2 puffs into the lungs in the  morning and at bedtime. 06/23/20   Autry-Lott, Naaman Plummer, DO  cetirizine (ZYRTEC) 10 MG tablet Take 1 tablet (10 mg total) by mouth daily. 02/03/21   Martyn Malay, MD  ferrous sulfate (FEROSUL) 325 (65 FE) MG tablet Take 1 tablet (325 mg total) by mouth daily. 01/25/21   Martyn Malay, MD  Fluticasone Propionate Truett Perna) 93 MCG/ACT EXHU Place 93 mcg into the nose in the morning and at bedtime. 2 sprays twice daily    [provider]  losartan (COZAAR) 50 MG tablet Take 1 tablet (50 mg total) by mouth at bedtime. 04/27/21   Martyn Malay, MD  montelukast (SINGULAIR) 10 MG tablet Take 1 tablet (10 mg total) by mouth at bedtime. 01/25/21   Martyn Malay, MD  Multiple Vitamin (MULTIVITAMIN WITH MINERALS) TABS tablet Take 1 tablet by mouth daily.    [provider]    Family History Family History  Problem Relation Age of Onset   Hypertension Mother    Hypertension Father    Hypertension Maternal Aunt    Breast cancer Maternal Aunt    Hypertension Maternal Uncle    Breast cancer Maternal Grandmother    Colon cancer Neg Hx    Esophageal cancer Neg Hx    Stomach cancer Neg Hx     Social History Social History   Tobacco Use   Smoking status: Never   Smokeless tobacco: Never  Vaping Use   Vaping Use: Never used  Substance Use Topics   Alcohol use: Yes    Comment: rarely   Drug use: No     Allergies   Lisinopril   Review of Systems Review of Systems  Constitutional:  Negative for appetite change, chills and fever.  HENT:  Positive for congestion. Negative for ear pain, rhinorrhea, sinus pressure, sinus pain and sore throat.   Eyes:  Negative for redness and visual disturbance.  Respiratory:  Positive for cough. Negative for chest tightness, shortness of breath and wheezing.   Cardiovascular:  Negative for chest pain and palpitations.  Gastrointestinal:  Negative for abdominal pain, constipation, diarrhea, nausea and vomiting.  Genitourinary:  Negative for  dysuria, frequency and urgency.  Musculoskeletal:  Positive for myalgias.  Neurological:  Negative for dizziness, weakness and headaches.  Psychiatric/Behavioral:  Negative for confusion.   All other systems reviewed and are negative.   Physical Exam Triage Vital Signs ED Triage Vitals [05/14/21 0945]  Enc Vitals Group     BP (!) 151/98     Pulse Rate 87     Resp 20     Temp 98.4 F (36.9 C)     Temp Source Oral     SpO2 97 %     Weight      Height      Head Circumference      Peak Flow      Pain Score 5     Pain Loc  Pain Edu?      Excl. in [place]?    No data found.  Updated Vital Signs BP (!) 151/98   Pulse 87   Temp 98.4 F (36.9 C) (Oral)   Resp 20   SpO2 97%   Visual Acuity Right Eye Distance:   Left Eye Distance:   Bilateral Distance:    Right Eye Near:   Left Eye Near:    Bilateral Near:     Physical Exam Vitals reviewed.  Constitutional:      General: She is not in acute distress.    Appearance: Normal appearance. She is not ill-appearing.  HENT:     Head: Normocephalic and atraumatic.     Right Ear: Tympanic membrane, ear canal and external ear normal. No tenderness. No middle ear effusion. There is no impacted cerumen. Tympanic membrane is not perforated, erythematous, retracted or bulging.     Left Ear: Tympanic membrane, ear canal and external ear normal. No tenderness.  No middle ear effusion. There is no impacted cerumen. Tympanic membrane is not perforated, erythematous, retracted or bulging.     Nose: Congestion present.     Mouth/Throat:     Mouth: Mucous membranes are moist.     Pharynx: Uvula midline. No oropharyngeal exudate or posterior oropharyngeal erythema.  Eyes:     Extraocular Movements: Extraocular movements intact.     Pupils: Pupils are equal, round, and reactive to light.  Cardiovascular:     Rate and Rhythm: Normal rate and regular rhythm.     Heart sounds: Normal heart sounds.  Pulmonary:     Effort: Pulmonary effort  is normal.     Breath sounds: Normal breath sounds.  Abdominal:     Palpations: Abdomen is soft.     Tenderness: There is no abdominal tenderness. There is no guarding or rebound.  Neurological:     General: No focal deficit present.     Mental Status: She is alert and oriented to person, place, and time.  Psychiatric:        Mood and Affect: Mood normal.        Behavior: Behavior normal.        Thought Content: Thought content normal.        Judgment: Judgment normal.     UC Treatments / Results  Labs (all labs ordered are listed, but only abnormal results are displayed) Labs Reviewed  COVID-19, FLU A+B NAA    EKG   Radiology No results found.  Procedures Procedures (including critical care time)  Medications Ordered in UC Medications - No data to display  Initial Impression / Assessment and Plan / UC Course  I have reviewed the triage vital signs and the nursing notes.  Pertinent labs & imaging results that were available during my care of the patient were reviewed by me and considered in my medical decision making (see chart for details).     This patient is a very pleasant 45 y.o. year old female presenting with viral URI with cough. Today this pt is afebrile nontachycardic nontachypneic, oxygenating well on room air, no wheezes rhonchi or rales.   Covid and flu swab sent.   Promethazine, prednisone, tessalon. Continue inhalers for asthma.  Work note provided. ED return precautions discussed. Patient verbalizes understanding and agreement.    Final Clinical Impressions(s) / UC Diagnoses   Final diagnoses:  Viral URI with cough  Encounter for screening for COVID-19  Moderate persistent asthma without complication     Discharge Instructions      -  Promethazine DM cough syrup for congestion/cough. This could make you drowsy, so take at night before bed. -Tessalon (Benzonatate) as needed for cough. Take one pill up to 3x daily (every 8  hours) -Prednisone, 2 pills taken at the same time for 5 days in a row.  Try taking this earlier in the day as it can give you energy. Avoid NSAIDs like ibuprofen and alleve while taking this medication as they can increase your risk of stomach upset and even GI bleeding when in combination with a steroid. You can continue tylenol (acetaminophen) up to '1000mg'$  3x daily. -Rest, drink plenty of fluids -Continue inhaler -With a virus, you're typically contagious for 5-7 days, or as long as you're having fevers.       ED Prescriptions     Medication Sig Dispense Auth. Provider   promethazine-dextromethorphan (PROMETHAZINE-DM) 6.25-15 MG/5ML syrup Take 5 mLs by mouth 4 (four) times daily as needed for cough. 118 mL Hazel Sams, PA-C   predniSONE (DELTASONE) 20 MG tablet Take 2 tablets (40 mg total) by mouth daily for 5 days. 10 tablet Hazel Sams, PA-C   benzonatate (TESSALON) 100 MG capsule Take 1 capsule (100 mg total) by mouth every 8 (eight) hours. 21 capsule Hazel Sams, PA-C      PDMP not reviewed this encounter.   Hazel Sams, PA-C 05/14/21 1034

## 2021-05-14 NOTE — Discharge Instructions (Addendum)
-  Promethazine DM cough syrup for congestion/cough. This could make you drowsy, so take at night before bed. -Tessalon (Benzonatate) as needed for cough. Take one pill up to 3x daily (every 8 hours) -Prednisone, 2 pills taken at the same time for 5 days in a row.  Try taking this earlier in the day as it can give you energy. Avoid NSAIDs like ibuprofen and alleve while taking this medication as they can increase your risk of stomach upset and even GI bleeding when in combination with a steroid. You can continue tylenol (acetaminophen) up to '1000mg'$  3x daily. -Rest, drink plenty of fluids -Continue inhaler -With a virus, you're typically contagious for 5-7 days, or as long as you're having fevers.

## 2021-05-16 LAB — COVID-19, FLU A+B NAA
Influenza A, NAA: NOT DETECTED
Influenza B, NAA: NOT DETECTED
SARS-CoV-2, NAA: DETECTED — AB

## 2021-06-07 ENCOUNTER — Other Ambulatory Visit: Payer: Self-pay

## 2021-06-07 DIAGNOSIS — J301 Allergic rhinitis due to pollen: Secondary | ICD-10-CM

## 2021-06-07 MED ORDER — CETIRIZINE HCL 10 MG PO TABS: 10.0000 mg | ORAL_TABLET | Freq: Every day | ORAL | 2 refills | Status: DC

## 2021-06-08 DIAGNOSIS — G4733 Obstructive sleep apnea (adult) (pediatric): Secondary | ICD-10-CM | POA: Diagnosis not present

## 2021-06-11 ENCOUNTER — Telehealth: Payer: Self-pay | Admitting: Family Medicine

## 2021-06-11 MED ORDER — AMLODIPINE BESYLATE 10 MG PO TABS: 10.0000 mg | ORAL_TABLET | Freq: Every day | ORAL | 0 refills | Status: DC

## 2021-06-11 NOTE — Telephone Encounter (Signed)
**  After Hours/ Emergency Line Call**  Received a call to report that Bracey ran out of her Amlodipine prescription. She no longer has any more refills. Would like some to get her through the weekend prior to upcoming appointment on Monday, though I do not see an appointment scheduled. Will refill 64-monthprescription to patients preferred pharmacy. Patient should schedule an appointment for further refills. Will forward to PCP.  ASharion Settler DO PGY-2, [facility] 06/11/2021 5:16 PM

## 2021-06-17 ENCOUNTER — Other Ambulatory Visit: Payer: Self-pay | Admitting: Family Medicine

## 2021-06-17 MED ORDER — AMLODIPINE BESYLATE 10 MG PO TABS: 10.0000 mg | ORAL_TABLET | Freq: Every day | ORAL | 0 refills | Status: DC

## 2021-06-17 NOTE — Addendum Note (Signed)
Addended by: Talbot Grumbling on: 06/17/2021 04:11 PM   Modules accepted: Orders

## 2021-06-17 NOTE — Telephone Encounter (Signed)
Patient returns call to nurse line. Reports that insurance will not cover rx at [facility]. Called and canceled at [facility].   Resent to [facility] on [address], per patient request.   Talbot Grumbling, RN

## 2021-06-18 NOTE — Telephone Encounter (Signed)
90 day supply to pharmacy   Dorris Singh, MD  [facility] Medicine Teaching Service

## 2021-06-22 ENCOUNTER — Telehealth: Payer: Self-pay

## 2021-06-22 DIAGNOSIS — R053 Chronic cough: Secondary | ICD-10-CM

## 2021-06-22 DIAGNOSIS — R058 Other specified cough: Secondary | ICD-10-CM

## 2021-06-22 DIAGNOSIS — J4531 Mild persistent asthma with (acute) exacerbation: Secondary | ICD-10-CM

## 2021-06-22 DIAGNOSIS — J4520 Mild intermittent asthma, uncomplicated: Secondary | ICD-10-CM

## 2021-06-22 MED ORDER — ALBUTEROL SULFATE HFA 108 (90 BASE) MCG/ACT IN AERS: 2.0000 | INHALATION_SPRAY | RESPIRATORY_TRACT | 2 refills | Status: DC | PRN

## 2021-06-22 MED ORDER — BUDESONIDE-FORMOTEROL FUMARATE 80-4.5 MCG/ACT IN AERO: 2.0000 | INHALATION_SPRAY | Freq: Two times a day (BID) | RESPIRATORY_TRACT | 12 refills | Status: DC

## 2021-06-22 MED ORDER — PROMETHAZINE-DM 6.25-15 MG/5ML PO SYRP: 5.0000 mL | ORAL_SOLUTION | Freq: Four times a day (QID) | ORAL | 0 refills | Status: DC | PRN

## 2021-06-22 MED ORDER — BENZONATATE 100 MG PO CAPS: 100.0000 mg | ORAL_CAPSULE | Freq: Three times a day (TID) | ORAL | 0 refills | Status: DC

## 2021-06-22 NOTE — Telephone Encounter (Signed)
Patient calls nurse line reporting she had covid on 7/22, however still has a terrible cough. Patient reports she coughs and ultimately urinates on herself. Patient denies any other symptoms of the virus. Patient is requesting a refill on the cough medications given at [facility]. Please advise.

## 2021-06-22 NOTE — Telephone Encounter (Signed)
Called patient to discuss severity of symptoms.  She denies chest pain or dyspnea.  She denies wheezing.  She does not think she has an asthma exacerbation but is not sure why her cough returned.  She had COVID at the end of July and her cough improved but then has returned with a vengeance as she described.  She discussed denies fever or sputum production.  Nursing please schedule for visit this week.  I have refilled the requested medications including her albuterol and ask her to increase the dose of her Symbicort to 2 puffs twice daily.  She has not been fully adherent with this.  Also ordered chest x-ray for review at visit.  Will call patient with results when they come in.  Reviewed reasons to call and return to care

## 2021-06-23 ENCOUNTER — Other Ambulatory Visit: Payer: Self-pay

## 2021-06-23 ENCOUNTER — Ambulatory Visit
Admission: RE | Admit: 2021-06-23 | Discharge: 2021-06-23 | Disposition: A | Discharge status: Home / Self Care | Payer: BC Managed Care – PPO | Source: Ambulatory Visit | Attending: Family Medicine | Admitting: Family Medicine

## 2021-06-23 DIAGNOSIS — R053 Chronic cough: Secondary | ICD-10-CM

## 2021-06-23 DIAGNOSIS — J4520 Mild intermittent asthma, uncomplicated: Secondary | ICD-10-CM

## 2021-06-23 DIAGNOSIS — R058 Other specified cough: Secondary | ICD-10-CM

## 2021-06-23 DIAGNOSIS — R059 Cough, unspecified: Secondary | ICD-10-CM | POA: Diagnosis not present

## 2021-06-23 NOTE — Telephone Encounter (Signed)
Patient has an apt scheduled for 9/16 with PCP. Patient is to call us once she gets CXR completed and if her symptoms worsen.

## 2021-07-09 ENCOUNTER — Ambulatory Visit: Payer: BC Managed Care – PPO | Admitting: Family Medicine

## 2021-07-09 ENCOUNTER — Other Ambulatory Visit: Payer: Self-pay

## 2021-07-09 ENCOUNTER — Encounter: Payer: Self-pay | Admitting: Family Medicine

## 2021-07-09 VITALS — BP 179/105 | HR 78 | Wt 184.6 lb

## 2021-07-09 DIAGNOSIS — N939 Abnormal uterine and vaginal bleeding, unspecified: Secondary | ICD-10-CM | POA: Diagnosis not present

## 2021-07-09 DIAGNOSIS — I10 Essential (primary) hypertension: Secondary | ICD-10-CM | POA: Diagnosis not present

## 2021-07-09 DIAGNOSIS — G4733 Obstructive sleep apnea (adult) (pediatric): Secondary | ICD-10-CM | POA: Diagnosis not present

## 2021-07-09 DIAGNOSIS — Z23 Encounter for immunization: Secondary | ICD-10-CM

## 2021-07-09 DIAGNOSIS — D500 Iron deficiency anemia secondary to blood loss (chronic): Secondary | ICD-10-CM

## 2021-07-09 NOTE — Assessment & Plan Note (Signed)
Patient has a plan to increase adherence to medications.  Reviewed return precautions.  She is going to monitor her blood pressure 3 times over the next week in the morning measuring it several times and message me with numbers.  I reviewed her goal of at least less than 130/80 with her.

## 2021-07-09 NOTE — Assessment & Plan Note (Signed)
Suspect cause is abnormal uterine bleeding.  We will repeat a CBC today.  She is taking her iron pills.  No symptoms of anemia.  Given her history and prior examination will obtain transvaginal ultrasound to evaluate for potential cause such as fibroids.  Given her intermittent symptoms of what she describes as Bartholin cyst will refer to gynecology for further recommendations she prefers this and has previously seen them for her gynecologic complaints.  Also considered but less likely endometrial hyperplasia, polyps less likely cervical dysplasia given recent normal Pap.

## 2021-07-09 NOTE — Patient Instructions (Signed)
It was wonderful to see you today.  Please bring ALL of your medications with you to every visit.   Today we talked about:  -checking your blood count  - you will be called about the Gynecology visit with Dr. Harolyn Rutherford  Measure your blood pressure 2-3 times a week in the morning--message me with numbers next Friday  Follow up in 3 months   Let me know if your sinuses act up  Call to schedule your mammogram [phone]   Thank you for choosing [facility].   Please call [phone] with any questions about today's appointment.  Please be sure to schedule follow up at the front  desk before you leave today.   Dorris Singh, MD  Family Medicine

## 2021-07-09 NOTE — Progress Notes (Signed)
    SUBJECTIVE:   CHIEF COMPLAINT: heavy menses and cysts HPI:   Jillian Vasquez is a 45 y.o. yo with history notable for hypertension, heavy menses and obesity presenting for follow-up for blood pressure and abnormal bleeding.  The patient reports adherence with her blood pressure medications she did forget to take her losartan last night.  She denies headaches, chest pain, dyspnea on exertion or lower extremity edema.  The patient reports that her periods have been heavier than usual and she has having what she describes as flares of prior Bartholin cyst.  She is not on her menses today and reports no cyst today.  She reports several months ago she had 1 flare.  She reports passing of clots at times as well as heavy menses.  She denies symptoms of anemia including syncope, palpitations, chest pain or dyspnea on exertion. She is interested in a definitive therapy for this--such as ablation or hysterectomy. Last Pap in 01/01/2020- NILM and negative HPV.   The patient reports that her sleep is improved with the use of CPAP.  She does have some sinus congestion at time but that is slowly improving.   The patient recently had COVID and this is improved.  She no longer has cough or dyspnea.  She has had no fevers. PERTINENT  PMH / PSH/Family/Social History : Updated and reviewed as appropriate.  OBJECTIVE:   BP (!) 179/105   Pulse 78   Wt 184 lb 9.6 oz (83.7 kg)   SpO2 100%   BMI 34.88 kg/m   Today's weight:  Last Weight  Most recent update: 07/09/2021  8:45 AM    Weight  83.7 kg (184 lb 9.6 oz)            Review of prior weights: [employer]   07/09/21 0845  Weight: 184 lb 9.6 oz (83.7 kg)    Cardiac: Regular rate and rhythm. Normal S1/S2. No murmurs, rubs, or gallops appreciated. Lungs: Clear bilaterally to ascultation.  Abdomen: Normoactive bowel sounds. No tenderness to deep or light palpation. No rebound or guarding.  Psych: Pleasant and appropriate     ASSESSMENT/PLAN:   Anemia Suspect cause is abnormal uterine bleeding.  We will repeat a CBC today.  She is taking her iron pills.  No symptoms of anemia.  Given her history and prior examination will obtain transvaginal ultrasound to evaluate for potential cause such as fibroids.  Given her intermittent symptoms of what she describes as Bartholin cyst will refer to gynecology for further recommendations she prefers this and has previously seen them for her gynecologic complaints.  Also considered but less likely endometrial hyperplasia, polyps less likely cervical dysplasia given recent normal Pap.   Essential hypertension Patient has a plan to increase adherence to medications.  Reviewed return precautions.  She is going to monitor her blood pressure 3 times over the next week in the morning measuring it several times and message me with numbers.  I reviewed her goal of at least less than 130/80 with her.     HCM  Flu today Number for mammogram given     Dorris Singh, MD  [place]

## 2021-07-10 LAB — CBC
Hematocrit: 42 % (ref 34.0–46.6)
Hemoglobin: 12.9 g/dL (ref 11.1–15.9)
MCH: 24.5 pg — ABNORMAL LOW (ref 26.6–33.0)
MCHC: 30.7 g/dL — ABNORMAL LOW (ref 31.5–35.7)
MCV: 80 fL (ref 79–97)
Platelets: 314 10*3/uL (ref 150–450)
RBC: 5.27 x10E6/uL (ref 3.77–5.28)
RDW: 15.4 % (ref 11.7–15.4)
WBC: 6.9 10*3/uL (ref 3.4–10.8)

## 2021-07-12 ENCOUNTER — Telehealth: Payer: Self-pay | Admitting: *Deleted

## 2021-07-12 NOTE — Telephone Encounter (Signed)
Pt scheduled and informed. Collie Kittel, CMA  

## 2021-07-12 NOTE — Telephone Encounter (Signed)
-----   Message from Martyn Malay, MD sent at 07/10/2021 10:32 AM EDT ----- Regarding: Ultrasound Hi Team,  Can we please schedule this patients ultrasound?   Thanks! CB

## 2021-07-13 ENCOUNTER — Other Ambulatory Visit: Payer: Self-pay | Admitting: Family Medicine

## 2021-07-15 ENCOUNTER — Ambulatory Visit: Payer: BC Managed Care – PPO | Admitting: Allergy & Immunology

## 2021-07-15 ENCOUNTER — Other Ambulatory Visit: Payer: Self-pay

## 2021-07-15 ENCOUNTER — Encounter: Payer: Self-pay | Admitting: Allergy & Immunology

## 2021-07-15 VITALS — BP 120/74 | HR 73 | Temp 98.1°F | Resp 16 | Ht 61.0 in | Wt 183.2 lb

## 2021-07-15 DIAGNOSIS — J3089 Other allergic rhinitis: Secondary | ICD-10-CM

## 2021-07-15 DIAGNOSIS — J339 Nasal polyp, unspecified: Secondary | ICD-10-CM | POA: Diagnosis not present

## 2021-07-15 DIAGNOSIS — J4540 Moderate persistent asthma, uncomplicated: Secondary | ICD-10-CM | POA: Diagnosis not present

## 2021-07-15 DIAGNOSIS — J302 Other seasonal allergic rhinitis: Secondary | ICD-10-CM

## 2021-07-15 MED ORDER — DUPILUMAB 300 MG/2ML ~~LOC~~ SOSY
300.0000 mg | PREFILLED_SYRINGE | Freq: Once | SUBCUTANEOUS | Status: AC
  Administered 2021-07-15: 300 mg via SUBCUTANEOUS

## 2021-07-15 NOTE — Patient Instructions (Addendum)
1. Moderate persistent asthma, uncomplicated - Lung testing looked fairly good today. - We are not going to make any changes aside from starting using a spacer. - Spacer use reviewed. - Daily controller medication(s): Symbicort 160/4.47mcg two puffs twice daily with spacer - Prior to physical activity: albuterol 2 puffs 10-15 minutes before physical activity. - Rescue medications: albuterol 4 puffs every 4-6 hours as needed - Asthma control goals:  * Full participation in all desired activities (may need albuterol before activity) * Albuterol use two time or less a week on average (not counting use with activity) * Cough interfering with sleep two time or less a month * Oral steroids no more than once a year * No hospitalizations  2. Chronic rhinitis - Testing today showed: grasses, indoor molds, and cat - Copy of test results provided.  - Avoidance measures provided. - Continue with: Xhance (fluticasone) 1-2 sprays per nostril daily and Zyrtec (cetirizine) 10mg  in the morning and Singulair (montelukast) 10mg  at night  - You can use an extra dose of the antihistamine, if needed, for breakthrough symptoms.  - Consider nasal saline rinses 1-2 times daily to remove allergens from the nasal cavities as well as help with mucous clearance (this is especially helpful to do before the nasal sprays are given) - Consider allergy shots as a means of long-term control. - Allergy shots "re-train" and "reset" the immune system to ignore environmental allergens and decrease the resulting immune response to those allergens (sneezing, itchy watery eyes, runny nose, nasal congestion, etc).    - Allergy shots improve symptoms in 75-85% of patients.  - We can discuss more at the next appointment if the medications are not working for you.  3. Nasal polyps - You seem to have nasal polyps on both sides. - Sample of [place] provided today. - Information provided and consent provided. - We are going to get  some blood work to see if you would qualify for the [place].   4. Return in about 2 months (around 09/14/2021).    Please inform us of any Emergency Department visits, hospitalizations, or changes in symptoms. Call us before going to the ED for breathing or allergy symptoms since we might be able to fit you in for a sick visit. Feel free to contact us anytime with any questions, problems, or concerns.  It was a pleasure to meet you today!  Websites that have reliable patient information: 1. American Academy of Asthma, Allergy, and Immunology: www.aaaai.org 2. Food Allergy Research and Education (FARE): foodallergy.org 3. Mothers of Asthmatics: http://www.asthmacommunitynetwork.org 4. American College of Allergy, Asthma, and Immunology: www.acaai.org   COVID-19 Vaccine Information can be found at: ShippingScam.co.uk For questions related to vaccine distribution or appointments, please email vaccine@ [email or web address] or call [phone].   We realize that you might be concerned about having an allergic reaction to the COVID19 vaccines. To help with that concern, WE ARE OFFERING THE COVID19 VACCINES IN OUR OFFICE! Ask the front desk for dates!     "Like" [place] on Facebook and Instagram for our latest updates!      A healthy democracy works best when [employer] participate! Make sure you are registered to vote! If you have moved or changed any of your contact information, you will need to get this updated before voting!  In some cases, you MAY be able to register to vote online: [email or web address]   1. Control-Buffer 50% Glycerol Negative   2. Control-Histamine 1 mg/ml 2+   3. Albumin saline Negative  4. Gage 3+   5. [place] Negative   6. Johnson 3+   7. [place] Blue 3+   8. Meadow Fescue 3+   9. Perennial Rye 3+   10. Sweet Vernal 3+   11. Timothy Negative   12. Cocklebur Negative   13.  Burweed Marshelder Negative   14. Ragweed, short Negative   15. Ragweed, Giant Negative   16. Plantain,  English Negative   17. Lamb's Quarters Negative   18. Sheep Sorrell Negative   19. Rough Pigweed Negative   20. Marsh Elder, Rough Negative   21. Mugwort, Common Negative   22. Ash mix Negative   23. Birch mix Negative   24. Beech American Negative   25. Box, Elder Negative   26. Cedar, red Negative   27. Cottonwood, [place] Negative   28. Elm mix Negative   29. Hickory Negative   30. Maple mix Negative   31. Oak, [place] mix Negative   32. Pecan Pollen Negative   33. Pine mix Negative   34. Sycamore Eastern Negative   35. [place], Black Pollen Negative   36. Alternaria alternata Negative   37. Cladosporium Herbarum Negative   38. Aspergillus mix Negative   39. Penicillium mix Negative   40. Bipolaris sorokiniana (Helminthosporium) Negative   41. Drechslera spicifera (Curvularia) Negative   42. Mucor plumbeus Negative   43. Fusarium moniliforme Negative   44. Aureobasidium pullulans (pullulara) Negative   45. Rhizopus oryzae Negative   46. Botrytis cinera Negative   47. Epicoccum nigrum Negative   48. Phoma betae Negative   49. Candida Albicans Negative   50. Trichophyton mentagrophytes Negative   51. Mite, D Farinae  5,000 AU/ml Negative   52. Mite, D Pteronyssinus  5,000 AU/ml Negative   53. Cat Hair 10,000 BAU/ml Negative   54.  Dog Epithelia Negative   55. Mixed Feathers Negative   56. Horse Epithelia Negative   57. Cockroach, German Negative   58. Mouse Negative   59. Tobacco Leaf Negative     Number of Test 13   Control Negative   [place] Negative   7 Grass Omitted   Ragweed mix Negative   Weed mix Negative   Mold 1 Negative   Mold 2 1+   Mold 3 Negative   Mold 4 2+   Cat 1+   Dog Negative   Cockroach 2+   Mite mix Negative      Reducing Pollen Exposure  The American Academy of Allergy, Asthma and Immunology suggests the following steps to  reduce your exposure to pollen during allergy seasons.    Do not hang sheets or clothing out to dry; pollen may collect on these items. Do not mow lawns or spend time around freshly cut grass; mowing stirs up pollen. Keep windows closed at night.  Keep car windows closed while driving. Minimize morning activities outdoors, a time when pollen counts are usually at their highest. Stay indoors as much as possible when pollen counts or humidity is high and on windy days when pollen tends to remain in the air longer. Use air conditioning when possible.  Many air conditioners have filters that trap the pollen spores. Use a HEPA room air filter to remove pollen form the indoor air you breathe.  Control of Mold Allergen   Mold and fungi can grow on a variety of surfaces provided certain temperature and moisture conditions exist.  Outdoor molds grow on plants, decaying vegetation and soil.  The major outdoor mold,  Alternaria and Cladosporium, are found in very high numbers during hot and dry conditions.  Generally, a late Summer - Fall peak is seen for common outdoor fungal spores.  Rain will temporarily lower outdoor mold spore count, but counts rise rapidly when the rainy period ends.  The most important indoor molds are Aspergillus and Penicillium.  Dark, humid and poorly ventilated basements are ideal sites for mold growth.  The next most common sites of mold growth are the bathroom and the kitchen.  Indoor (Perennial) Mold Control   Positive indoor molds via skin testing: Aspergillus, Penicillium, Fusarium, Aureobasidium (Pullulara), and Rhizopus  Maintain humidity below 50%. Clean washable surfaces with 5% bleach solution. Remove sources e.g. contaminated carpets.   Control of Dog or Cat Allergen  Avoidance is the best way to manage a dog or cat allergy. If you have a dog or cat and are allergic to dog or cats, consider removing the dog or cat from the home. If you have a dog or cat but don't  want to find it a new home, or if your family wants a pet even though someone in the household is allergic, here are some strategies that may help keep symptoms at bay:  Keep the pet out of your bedroom and restrict it to only a few rooms. Be advised that keeping the dog or cat in only one room will not limit the allergens to that room. Don't pet, hug or kiss the dog or cat; if you do, wash your hands with soap and water. High-efficiency particulate air (HEPA) cleaners run continuously in a bedroom or living room can reduce allergen levels over time. Regular use of a high-efficiency vacuum cleaner or a central vacuum can reduce allergen levels. Giving your dog or cat a bath at least once a week can reduce airborne allergen.

## 2021-07-15 NOTE — Progress Notes (Signed)
NEW PATIENT  Date of Service/Encounter:  07/15/21  Consult requested by: Martyn Malay, MD   Assessment:   Moderate persistent asthma, uncomplicated  Seasonal and perennial allergic rhinitis (grasses, indoor molds, and cat)  Nasal polyps   Plan/Recommendations:   1. Moderate persistent asthma, uncomplicated - Lung testing looked fairly good today. - We are not going to make any changes aside from starting using a spacer. - Spacer use reviewed. - Daily controller medication(s): Symbicort 160/4.69mcg two puffs twice daily with spacer - Prior to physical activity: albuterol 2 puffs 10-15 minutes before physical activity. - Rescue medications: albuterol 4 puffs every 4-6 hours as needed - Asthma control goals:  * Full participation in all desired activities (may need albuterol before activity) * Albuterol use two time or less a week on average (not counting use with activity) * Cough interfering with sleep two time or less a month * Oral steroids no more than once a year * No hospitalizations  2. Chronic rhinitis - Testing today showed: grasses, indoor molds, and cat - Copy of test results provided.  - Avoidance measures provided. - Continue with: Xhance (fluticasone) 1-2 sprays per nostril daily and Zyrtec (cetirizine) 10mg  in the morning and Singulair (montelukast) 10mg  at night  - You can use an extra dose of the antihistamine, if needed, for breakthrough symptoms.  - Consider nasal saline rinses 1-2 times daily to remove allergens from the nasal cavities as well as help with mucous clearance (this is especially helpful to do before the nasal sprays are given) - Consider allergy shots as a means of long-term control. - Allergy shots "re-train" and "reset" the immune system to ignore environmental allergens and decrease the resulting immune response to those allergens (sneezing, itchy watery eyes, runny nose, nasal congestion, etc).    - Allergy shots improve symptoms in  75-85% of patients.  - We can discuss more at the next appointment if the medications are not working for you.  3. Nasal polyps - You seem to have nasal polyps on both sides. - Sample of [place] provided today. - Information provided and consent provided. - We are going to get some blood work to see if you would qualify for the [place].   4. Return in about 2 months (around 09/14/2021).   This note in its entirety was forwarded to the Provider who requested this consultation.  Subjective:   Jillian Vasquez is a 45 y.o. female presenting today for evaluation of  Chief Complaint  Patient presents with   Sinusitis    Says she has has sinus issues for the past five years.   Asthma    Says she has flare ups with the weather chnages    Jillian Vasquez has a history of the following: Patient Active Problem List   Diagnosis Date Noted   Abnormal uterine bleeding 07/09/2021   Obstructive sleep apnea 01/04/2021   Nasal congestion 09/04/2019   Asthma 01/02/2013   Anemia 03/15/2012   Overweight(278.02) 05/21/2009   Essential hypertension 12/21/2006    History obtained from: chart review and patient.  Jillian Vasquez was referred by Martyn Malay, MD.     Jillian Vasquez is a 45 y.o. female presenting for an evaluation of nasal congestion and asthma.   Asthma/Respiratory Symptom History: She developed asthma a few years ago. This is all new to her. She is on Symbicort which was started last year and she does this twice daily. She is using her rescue  inhaler fairly rarely. The heat is a big trigger for her symptoms.  She was admitted to the hospital before she was officially diagnosed with asthma. This was around 78 or 45 years of age. She mostly takes Tylenol. She does not use NSAIDs or aspirin. She has never noticed that it worsens with these medications. She was on lisinopril which seemed to worsen her cough.   Allergic Rhinitis Symptom  History: She has had one sinus surgery in 2020 for nasal polyps by Dr. Lucia Gaskins. She continues to have issues with her sense of smell coming and going. She is unsure of her triggers. She is currently on Xhance twice daily.   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 She seems to really love the kiddos.   Otherwise, there is no history of other atopic diseases, including food allergies, drug allergies, stinging insect allergies, eczema, urticaria, or contact dermatitis. There is no significant infectious history. Vaccinations are up to date.    Past Medical History: Patient Active Problem List   Diagnosis Date Noted   Abnormal uterine bleeding 07/09/2021   Obstructive sleep apnea 01/04/2021   Nasal congestion 09/04/2019   Asthma 01/02/2013   Anemia 03/15/2012   Overweight(278.02) 05/21/2009   Essential hypertension 12/21/2006    Medication List:  Allergies as of 07/15/2021       Reactions   Lisinopril Cough        Medication List        Accurate as of July 15, 2021 10:24 PM. If you have any questions, ask your nurse or doctor.          STOP taking these medications    promethazine-dextromethorphan 6.25-15 MG/5ML syrup Commonly known as: PROMETHAZINE-DM Stopped by: Valentina Shaggy, MD       TAKE these medications    albuterol (2.5 MG/3ML) 0.083% nebulizer solution Commonly known as: PROVENTIL Take 3 mLs (2.5 mg total) by nebulization every 6 (six) hours as needed for wheezing or shortness of breath.   albuterol 108 (90 Base) MCG/ACT inhaler Commonly known as: VENTOLIN HFA Inhale 2 puffs into the lungs every 4 (four) hours as needed for wheezing or shortness of breath.   amLODipine 10 MG tablet Commonly known as: NORVASC TAKE 1 TABLET BY MOUTH EVERY DAY   benzonatate 100 MG capsule Commonly known as: TESSALON Take 1 capsule (100 mg total) by mouth every 8 (eight) hours.   budesonide-formoterol 80-4.5 MCG/ACT inhaler Commonly known as:  Symbicort Inhale 2 puffs into the lungs in the morning and at bedtime.   cetirizine 10 MG tablet Commonly known as: ZYRTEC Take 1 tablet (10 mg total) by mouth daily.   ferrous sulfate 325 (65 FE) MG tablet Commonly known as: FeroSul Take 1 tablet (325 mg total) by mouth daily.   losartan 50 MG tablet Commonly known as: COZAAR Take 1 tablet (50 mg total) by mouth at bedtime.   montelukast 10 MG tablet Commonly known as: SINGULAIR Take 1 tablet (10 mg total) by mouth at bedtime.   multivitamin with minerals Tabs tablet Take 1 tablet by mouth daily.   Xhance 93 MCG/ACT Exhu Generic drug: Fluticasone Propionate Place 93 mcg into the nose in the morning and at bedtime. 2 sprays twice daily        Birth History: non-contributory  Developmental History: non-contributory  Past Surgical History: Past Surgical History:  Procedure Laterality Date   BARTHOLIN CYST MARSUPIALIZATION N/A 08/29/2014   Procedure: BARTHOLIN CYST MARSUPIALIZATION;  Surgeon: Sallyanne Havers  Anyanwu, MD;  Location: [facility];  Service: Gynecology;  Laterality: N/A;   CESAREAN SECTION  10/24/1998   ETHMOIDECTOMY Bilateral 01/03/2019   Procedure: ETHMOIDECTOMY;  Surgeon: Rozetta Nunnery, MD;  Location: [facility];  Service: ENT;  Laterality: Bilateral;   LIVER BIOPSY     MAXILLARY ANTROSTOMY Bilateral 01/03/2019   Procedure: MAXILLARY ANTROSTOMY WITH REMOVAL OF TISSUE;  Surgeon: Rozetta Nunnery, MD;  Location: [facility];  Service: ENT;  Laterality: Bilateral;   NASAL SINUS SURGERY Bilateral 01/03/2019   Procedure: ENDOSCOPIC SINUS SURGERY WITH SHENOIDOTOMY;  Surgeon: Rozetta Nunnery, MD;  Location: [facility];  Service: ENT;  Laterality: Bilateral;   SINUS ENDO WITH FUSION Bilateral 01/03/2019   Procedure: SINUS ENDOSCOPY WITH FUSION NAVIGATION;  Surgeon: Rozetta Nunnery, MD;  Location: [facility];  Service: ENT;  Laterality:  Bilateral;   TUBAL LIGATION  10/24/1998   TURBINATE REDUCTION Bilateral 01/03/2019   Procedure: TURBINATE REDUCTION;  Surgeon: Rozetta Nunnery, MD;  Location: [facility];  Service: ENT;  Laterality: Bilateral;     Family History: Family History  Problem Relation Age of Onset   Hypertension Mother    Hypertension Father    Hypertension Maternal Aunt    Breast cancer Maternal Aunt    Hypertension Maternal Uncle    Breast cancer Maternal Grandmother    Colon cancer Neg Hx    Esophageal cancer Neg Hx    Stomach cancer Neg Hx      Social History: Brendan lives at home with her family. There is smoking exposure. There is no pet exposure. She denies any exposure to molds or insects.     Review of Systems  Constitutional: Negative.  Negative for fever, malaise/fatigue and weight loss.  HENT:  Positive for congestion and sinus pain. Negative for ear discharge and ear pain.   Eyes:  Negative for pain, discharge and redness.  Respiratory:  Negative for cough, sputum production, shortness of breath and wheezing.   Cardiovascular: Negative.  Negative for chest pain and palpitations.  Gastrointestinal:  Negative for abdominal pain, constipation, diarrhea, heartburn, nausea and vomiting.  Skin: Negative.  Negative for itching and rash.  Neurological:  Negative for dizziness and headaches.  Endo/Heme/Allergies:  Negative for environmental allergies. Does not bruise/bleed easily.  All other systems reviewed and are negative.     Objective:   Blood pressure 120/74, pulse 73, temperature 98.1 F (36.7 C), temperature source Temporal, resp. rate 16, height 5\' 1"  (1.549 m), weight 183 lb 3.2 oz (83.1 kg), SpO2 97 %. Body mass index is 34.62 kg/m.   Physical Exam:   Physical Exam Constitutional:      Appearance: She is well-developed.  HENT:     Head: Normocephalic and atraumatic.     Right Ear: Tympanic membrane, ear canal and external ear normal. No drainage,  swelling or tenderness. Tympanic membrane is not injected, scarred, erythematous, retracted or bulging.     Left Ear: Tympanic membrane, ear canal and external ear normal. No drainage, swelling or tenderness. Tympanic membrane is not injected, scarred, erythematous, retracted or bulging.     Nose: No nasal deformity, septal deviation, mucosal edema or rhinorrhea.     Right Turbinates: Enlarged, swollen and pale.     Left Turbinates: Enlarged, swollen and pale.     Right Sinus: No maxillary sinus tenderness or frontal sinus tenderness.     Left Sinus: No maxillary sinus tenderness or frontal sinus tenderness.  Comments: Turbinates markedly enlarged. There are nasal polyps noted in the bilateral nasal cavities. They are obstructing approximately 90% of the nasal cavity.     Mouth/Throat:     Mouth: Mucous membranes are not pale and not dry.     Pharynx: Uvula midline.  Eyes:     General:        Right eye: No discharge.        Left eye: No discharge.     Conjunctiva/sclera: Conjunctivae normal.     Right eye: Right conjunctiva is not injected. No chemosis.    Left eye: Left conjunctiva is not injected. No chemosis.    Pupils: Pupils are equal, round, and reactive to light.  Cardiovascular:     Rate and Rhythm: Normal rate and regular rhythm.     Heart sounds: Normal heart sounds.  Pulmonary:     Effort: Pulmonary effort is normal. No tachypnea, accessory muscle usage or respiratory distress.     Breath sounds: Normal breath sounds. No wheezing, rhonchi or rales.     Comments: Moving air well in all lung fields. No increased work of breathing.  Chest:     Chest wall: No tenderness.  Abdominal:     Tenderness: There is no abdominal tenderness. There is no guarding or rebound.  Lymphadenopathy:     Head:     Right side of head: No submandibular, tonsillar or occipital adenopathy.     Left side of head: No submandibular, tonsillar or occipital adenopathy.     Cervical: No cervical  adenopathy.  Skin:    General: Skin is warm.     Capillary Refill: Capillary refill takes less than 2 seconds.     Coloration: Skin is not pale.     Findings: No abrasion, erythema, petechiae or rash. Rash is not papular, urticarial or vesicular.  Neurological:     Mental Status: She is alert.     Diagnostic studies:    Spirometry: results abnormal (FEV1: 1.70/77%, FVC: 1.89/70%, FEV1/FVC: 90%).    Spirometry consistent with possible restrictive disease.   Allergy Studies:    Airborne Adult Perc - 07/15/21 0935     Time Antigen Placed 0935    Allergen Manufacturer Lavella Hammock    Location Back    Number of Test 59    1. Control-Buffer 50% Glycerol Negative    2. Control-Histamine 1 mg/ml 2+    3. Albumin saline Negative    4. [place] 3+    5. [place] Negative    6. [place] 3+    7. [place] Blue 3+    8. Meadow [place]esc[place] 3+    9. Perennial Rye 3+    10. Sweet Vernal 3+    11. Timothy Negative    12. Cocklebur Negative    13. Burweed Marshelder Negative    14. Ragweed, short Negative    15. Ragweed, Giant Negative    16. Plantain,  English Negative    17. Lamb's Quarters Negative    18. Sheep Sorrell Negative    19. Rough Pigweed Negative    20. Marsh Elder, Rough Negative    21. Mugwort, Common Negative    22. Ash mix Negative    23. Birch mix Negative    24. Beech American Negative    25. Box, Elder Negative    26. Cedar, red Negative    27. Cottonwood, [place] Negative    28. Elm mix Negative    29. Hickory Negative    30. Maple mix Negative  [place], [place] mix Negative    32. Pecan Pollen Negative    33. Pine mix Negative    34. Sycamore Eastern Negative    35. [place], Black Pollen Negative    36. Alternaria alternata Negative    37. Cladosporium Herbarum Negative    38. Aspergillus mix Negative    39. Penicillium mix Negative    40. Bipolaris sorokiniana (Helminthosporium) Negative    41. Drechslera spicifera (Curvularia) Negative    42. Mucor plumbeus  Negative    43. Fusarium moniliforme Negative    44. Aureobasidium pullulans (pullulara) Negative    45. Rhizopus oryzae Negative    46. Botrytis cinera Negative    47. Epicoccum nigrum Negative    48. Phoma betae Negative    49. Candida Albicans Negative    50. Trichophyton mentagrophytes Negative    51. Mite, D Farinae  5,000 AU/ml Negative    52. Mite, D Pteronyssinus  5,000 AU/ml Negative    53. Cat Hair 10,000 BAU/ml Negative    54.  Dog Epithelia Negative    55. Mixed Feathers Negative    56. Horse Epithelia Negative    57. Cockroach, German Negative    58. Mouse Negative    59. Tobacco Leaf Negative             Intradermal - 07/15/21 0952     Time Antigen Placed 6378    Allergen Manufacturer Lavella Hammock    Location Arm    Number of Test 13    Control Negative    [place] Negative    7 Grass Omitted    Ragweed mix Negative    Weed mix Negative    Mold 1 Negative    Mold 2 1+    Mold 3 Negative    Mold 4 2+    Cat 1+    Dog Negative    Cockroach 2+    Mite mix Negative             Allergy testing results were read and interpreted by myself, documented by clinical staff.         Salvatore Marvel, MD [facility]

## 2021-07-16 LAB — CBC WITH DIFFERENTIAL
Basophils Absolute: 0.1 10*3/uL (ref 0.0–0.2)
Basos: 1 %
EOS (ABSOLUTE): 0.5 10*3/uL — ABNORMAL HIGH (ref 0.0–0.4)
Eos: 8 %
Hematocrit: 43.4 % (ref 34.0–46.6)
Hemoglobin: 13.1 g/dL (ref 11.1–15.9)
Immature Grans (Abs): 0 10*3/uL (ref 0.0–0.1)
Immature Granulocytes: 0 %
Lymphocytes Absolute: 2.2 10*3/uL (ref 0.7–3.1)
Lymphs: 33 %
MCH: 23.9 pg — ABNORMAL LOW (ref 26.6–33.0)
MCHC: 30.2 g/dL — ABNORMAL LOW (ref 31.5–35.7)
MCV: 79 fL (ref 79–97)
Monocytes Absolute: 0.6 10*3/uL (ref 0.1–0.9)
Monocytes: 9 %
Neutrophils Absolute: 3.4 10*3/uL (ref 1.4–7.0)
Neutrophils: 49 %
RBC: 5.48 x10E6/uL — ABNORMAL HIGH (ref 3.77–5.28)
RDW: 15.7 % — ABNORMAL HIGH (ref 11.7–15.4)
WBC: 6.7 10*3/uL (ref 3.4–10.8)

## 2021-07-19 MED ORDER — DUPILUMAB 300 MG/2ML ~~LOC~~ SOSY
300.0000 mg | PREFILLED_SYRINGE | SUBCUTANEOUS | Status: DC
  Administered 2021-07-15 – 2022-08-11 (×18): 300 mg via SUBCUTANEOUS

## 2021-07-19 NOTE — Addendum Note (Signed)
Addended by: Carin Hock on: 07/19/2021 03:59 PM   Modules accepted: Orders

## 2021-07-20 ENCOUNTER — Telehealth: Payer: Self-pay | Admitting: *Deleted

## 2021-07-20 NOTE — Telephone Encounter (Signed)
-----   Message from Valentina Shaggy, MD sent at 07/15/2021 10:30 PM EDT ----- Sample of [place] provided. Did I loading down of 600mg  hoping that we could approve based on asthma.

## 2021-07-20 NOTE — Telephone Encounter (Signed)
L/M for patient to contact me to advise approval, copay card and submit to [place] for [place]

## 2021-07-22 ENCOUNTER — Ambulatory Visit (HOSPITAL_COMMUNITY)
Admission: RE | Admit: 2021-07-22 | Discharge: 2021-07-22 | Disposition: A | Discharge status: Home / Self Care | Payer: BC Managed Care – PPO | Source: Ambulatory Visit | Attending: Family Medicine | Admitting: Family Medicine

## 2021-07-22 ENCOUNTER — Other Ambulatory Visit: Payer: Self-pay

## 2021-07-22 DIAGNOSIS — N888 Other specified noninflammatory disorders of cervix uteri: Secondary | ICD-10-CM | POA: Diagnosis not present

## 2021-07-22 DIAGNOSIS — N939 Abnormal uterine and vaginal bleeding, unspecified: Secondary | ICD-10-CM | POA: Diagnosis not present

## 2021-07-22 DIAGNOSIS — D252 Subserosal leiomyoma of uterus: Secondary | ICD-10-CM | POA: Diagnosis not present

## 2021-07-27 NOTE — Telephone Encounter (Signed)
Called patient and advised approval, submit and copay card info. Patient wants to get same in office and will make appt once delivery set up

## 2021-07-28 NOTE — Telephone Encounter (Signed)
Great - thanks Tammy!   Salvatore Marvel, MD [facility]

## 2021-07-29 ENCOUNTER — Ambulatory Visit: Payer: BC Managed Care – PPO

## 2021-08-08 DIAGNOSIS — G4733 Obstructive sleep apnea (adult) (pediatric): Secondary | ICD-10-CM | POA: Diagnosis not present

## 2021-08-11 ENCOUNTER — Ambulatory Visit (INDEPENDENT_AMBULATORY_CARE_PROVIDER_SITE_OTHER): Payer: BC Managed Care – PPO | Admitting: *Deleted

## 2021-08-11 ENCOUNTER — Other Ambulatory Visit: Payer: Self-pay

## 2021-08-11 DIAGNOSIS — J339 Nasal polyp, unspecified: Secondary | ICD-10-CM | POA: Diagnosis not present

## 2021-08-19 ENCOUNTER — Encounter: Payer: Self-pay | Admitting: Family Medicine

## 2021-08-25 ENCOUNTER — Ambulatory Visit (INDEPENDENT_AMBULATORY_CARE_PROVIDER_SITE_OTHER): Payer: BC Managed Care – PPO

## 2021-08-25 ENCOUNTER — Other Ambulatory Visit: Payer: Self-pay

## 2021-08-25 DIAGNOSIS — J4540 Moderate persistent asthma, uncomplicated: Secondary | ICD-10-CM | POA: Diagnosis not present

## 2021-08-28 ENCOUNTER — Ambulatory Visit
Admission: RE | Admit: 2021-08-28 | Discharge: 2021-08-28 | Disposition: A | Discharge status: Home / Self Care | Payer: BC Managed Care – PPO | Source: Ambulatory Visit | Attending: Family Medicine | Admitting: Family Medicine

## 2021-08-28 DIAGNOSIS — R922 Inconclusive mammogram: Secondary | ICD-10-CM | POA: Diagnosis not present

## 2021-08-28 DIAGNOSIS — Z09 Encounter for follow-up examination after completed treatment for conditions other than malignant neoplasm: Secondary | ICD-10-CM

## 2021-08-31 ENCOUNTER — Telehealth: Payer: Self-pay | Admitting: Family Medicine

## 2021-08-31 NOTE — Telephone Encounter (Signed)
Called patient to discuss results of abnormal mammogram.  She already has follow-up scheduled with breast surgeon.  We will send letter and follow-up after surgery appointment later this month.  Patient did not answer and her generic voicemail was left to call back should she have any additional questions or concerns.

## 2021-09-08 DIAGNOSIS — G4733 Obstructive sleep apnea (adult) (pediatric): Secondary | ICD-10-CM | POA: Diagnosis not present

## 2021-09-09 ENCOUNTER — Ambulatory Visit (INDEPENDENT_AMBULATORY_CARE_PROVIDER_SITE_OTHER): Payer: BC Managed Care – PPO

## 2021-09-09 ENCOUNTER — Encounter: Payer: Self-pay | Admitting: Allergy & Immunology

## 2021-09-09 ENCOUNTER — Other Ambulatory Visit: Payer: Self-pay

## 2021-09-09 ENCOUNTER — Ambulatory Visit: Payer: BC Managed Care – PPO | Admitting: Allergy & Immunology

## 2021-09-09 VITALS — BP 140/90 | HR 81 | Temp 98.7°F | Resp 18 | Ht 61.0 in | Wt 189.4 lb

## 2021-09-09 DIAGNOSIS — J339 Nasal polyp, unspecified: Secondary | ICD-10-CM

## 2021-09-09 DIAGNOSIS — J4540 Moderate persistent asthma, uncomplicated: Secondary | ICD-10-CM | POA: Diagnosis not present

## 2021-09-09 DIAGNOSIS — J3089 Other allergic rhinitis: Secondary | ICD-10-CM

## 2021-09-09 NOTE — Patient Instructions (Addendum)
1. Moderate persistent asthma, uncomplicated - Lung testing looks much better today. - I am very proud of you!  - Daily controller medication(s): Symbicort 160/4.63mcg two puffs twice daily with spacer + Dupixent 300mg  every two weeks - Prior to physical activity: albuterol 2 puffs 10-15 minutes before physical activity. - Rescue medications: albuterol 4 puffs every 4-6 hours as needed - Asthma control goals:  * Full participation in all desired activities (may need albuterol before activity) * Albuterol use two time or less a week on average (not counting use with activity) * Cough interfering with sleep two time or less a month * Oral steroids no more than once a year * No hospitalizations  2. Chronic rhinitis (grasses, indoor molds, and cat) - You are doing SO WELL!  - Continue with: as needed nasal sprays and Dupixent every two weeks.  - You can use an extra dose of the antihistamine, if needed, for breakthrough symptoms.   3. Nasal polyps - It seems that the [place] provided today.  4. Return in about 6 months (around 03/09/2022).    Please inform us of any Emergency Department visits, hospitalizations, or changes in symptoms. Call us before going to the ED for breathing or allergy symptoms since we might be able to fit you in for a sick visit. Feel free to contact us anytime with any questions, problems, or concerns.  It was a pleasure to meet you today!  Websites that have reliable patient information: 1. American Academy of Asthma, Allergy, and Immunology: www.aaaai.org 2. Food Allergy Research and Education (FARE): foodallergy.org 3. Mothers of Asthmatics: http://www.asthmacommunitynetwork.org 4. American College of Allergy, Asthma, and Immunology: www.acaai.org   COVID-19 Vaccine Information can be found at: ShippingScam.co.uk For questions related to vaccine distribution or appointments, please email  [email or web address] or call [phone].   We realize that you might be concerned about having an allergic reaction to the COVID19 vaccines. To help with that concern, WE ARE OFFERING THE COVID19 VACCINES IN OUR OFFICE! Ask the front desk for dates!     "Like" [place] on Facebook and Instagram for our latest updates!      A healthy democracy works best when [employer] participate! Make sure you are registered to vote! If you have moved or changed any of your contact information, you will need to get this updated before voting!  In some cases, you MAY be able to register to vote online: [email or web address]   1. Control-Buffer 50% Glycerol Negative   2. Control-Histamine 1 mg/ml 2+   3. Albumin saline Negative   4. [place] 3+   5. [place] Negative   6. [place] 3+   7. [place] Blue 3+   8. Meadow [place]escue 3+   9. Perennial Rye 3+   10. Sweet Vernal 3+   11. Timothy Negative   12. Cocklebur Negative   13. Burweed Marshelder Negative   14. Ragweed, short Negative   15. Ragweed, Giant Negative   16. Plantain,  English Negative   17. Lamb's Quarters Negative   18. Sheep Sorrell Negative   19. Rough Pigweed Negative   20. Marsh Elder, Rough Negative   21. Mugwort, Common Negative   22. Ash mix Negative   23. Birch mix Negative   24. Beech American Negative   25. Box, Elder Negative   26. Cedar, red Negative   27. Cottonwood, [place] Negative   28. Elm mix Negative   29. Hickory Negative   30. Maple mix Negative   31. [place], [place] mix  Negative   32. Pecan Pollen Negative   33. Pine mix Negative   34. Sycamore Eastern Negative   35. [place], Black Pollen Negative   36. Alternaria alternata Negative   37. Cladosporium Herbarum Negative   38. Aspergillus mix Negative   39. Penicillium mix Negative   40. Bipolaris sorokiniana (Helminthosporium) Negative   41. Drechslera spicifera (Curvularia) Negative   42. Mucor plumbeus Negative   43. Fusarium  moniliforme Negative   44. Aureobasidium pullulans (pullulara) Negative   45. Rhizopus oryzae Negative   46. Botrytis cinera Negative   47. Epicoccum nigrum Negative   48. Phoma betae Negative   49. Candida Albicans Negative   50. Trichophyton mentagrophytes Negative   51. Mite, D Farinae  5,000 AU/ml Negative   52. Mite, D Pteronyssinus  5,000 AU/ml Negative   53. Cat Hair 10,000 BAU/ml Negative   54.  Dog Epithelia Negative   55. Mixed Feathers Negative   56. Horse Epithelia Negative   57. Cockroach, German Negative   58. Mouse Negative   59. Tobacco Leaf Negative     Number of Test 13   Control Negative   [place] Negative   7 Grass Omitted   Ragweed mix Negative   Weed mix Negative   Mold 1 Negative   Mold 2 1+   Mold 3 Negative   Mold 4 2+   Cat 1+   Dog Negative   Cockroach 2+   Mite mix Negative      Reducing Pollen Exposure  The American Academy of Allergy, Asthma and Immunology suggests the following steps to reduce your exposure to pollen during allergy seasons.    Do not hang sheets or clothing out to dry; pollen may collect on these items. Do not mow lawns or spend time around freshly cut grass; mowing stirs up pollen. Keep windows closed at night.  Keep car windows closed while driving. Minimize morning activities outdoors, a time when pollen counts are usually at their highest. Stay indoors as much as possible when pollen counts or humidity is high and on windy days when pollen tends to remain in the air longer. Use air conditioning when possible.  Many air conditioners have filters that trap the pollen spores. Use a HEPA room air filter to remove pollen form the indoor air you breathe.  Control of Mold Allergen   Mold and fungi can grow on a variety of surfaces provided certain temperature and moisture conditions exist.  Outdoor molds grow on plants, decaying vegetation and soil.  The major outdoor mold, Alternaria and Cladosporium, are found in very  high numbers during hot and dry conditions.  Generally, a late Summer - Fall peak is seen for common outdoor fungal spores.  Rain will temporarily lower outdoor mold spore count, but counts rise rapidly when the rainy period ends.  The most important indoor molds are Aspergillus and Penicillium.  Dark, humid and poorly ventilated basements are ideal sites for mold growth.  The next most common sites of mold growth are the bathroom and the kitchen.  Indoor (Perennial) Mold Control   Positive indoor molds via skin testing: Aspergillus, Penicillium, Fusarium, Aureobasidium (Pullulara), and Rhizopus  Maintain humidity below 50%. Clean washable surfaces with 5% bleach solution. Remove sources e.g. contaminated carpets.   Control of Dog or Cat Allergen  Avoidance is the best way to manage a dog or cat allergy. If you have a dog or cat and are allergic to dog or cats, consider removing the dog or  cat from the home. If you have a dog or cat but don't want to find it a new home, or if your family wants a pet even though someone in the household is allergic, here are some strategies that may help keep symptoms at bay:  Keep the pet out of your bedroom and restrict it to only a few rooms. Be advised that keeping the dog or cat in only one room will not limit the allergens to that room. Don't pet, hug or kiss the dog or cat; if you do, wash your hands with soap and water. High-efficiency particulate air (HEPA) cleaners run continuously in a bedroom or living room can reduce allergen levels over time. Regular use of a high-efficiency vacuum cleaner or a central vacuum can reduce allergen levels. Giving your dog or cat a bath at least once a week can reduce airborne allergen.

## 2021-09-09 NOTE — Progress Notes (Signed)
FOLLOW UP  Date of Service/Encounter:  09/09/21   Assessment:    Moderate persistent asthma, uncomplicated   Seasonal and perennial allergic rhinitis (grasses, indoor molds, and cat)   Nasal polyps   Plan/Recommendations:   1. Moderate persistent asthma, uncomplicated - Lung testing looks much better today. - I am very proud of you!  - Daily controller medication(s): Symbicort 160/4.82mcg two puffs twice daily with spacer + Dupixent 300mg  every two weeks - Prior to physical activity: albuterol 2 puffs 10-15 minutes before physical activity. - Rescue medications: albuterol 4 puffs every 4-6 hours as needed - Asthma control goals:  * Full participation in all desired activities (may need albuterol before activity) * Albuterol use two time or less a week on average (not counting use with activity) * Cough interfering with sleep two time or less a month * Oral steroids no more than once a year * No hospitalizations  2. Chronic rhinitis (grasses, indoor molds, and cat) - You are doing SO WELL!  - Continue with: as needed nasal sprays and Dupixent every two weeks.  - You can use an extra dose of the antihistamine, if needed, for breakthrough symptoms.   3. Nasal polyps - It seems that the [place] provided today.  4. Return in about 6 months (around 03/09/2022).   Subjective:   Jillian Vasquez is a 45 y.o. female presenting today for follow up of  Chief Complaint  Patient presents with   Follow-up    Patient in today for a follow up and states she feels 200% better since starting the injections.    Jillian Vasquez Jillian Vasquez has a history of the following: Patient Active Problem List   Diagnosis Date Noted   Abnormal uterine bleeding 07/09/2021   Obstructive sleep apnea 01/04/2021   Nasal congestion 09/04/2019   Asthma 01/02/2013   Anemia 03/15/2012   Overweight(278.02) 05/21/2009   Essential hypertension 12/21/2006    History obtained from:  chart review and patient.  Jillian Vasquez is a 45 y.o. female presenting for a follow up visit.  She was last seen in September 2022 as a new patient.  At that time, her lung testing looked fairly good.  We continue with Symbicort 160 mcg 2 puffs twice daily as well as albuterol as needed.  She underwent testing that was positive to grasses, indoor molds, and cats.  We continued the Xhance 1 to 2 sprays per nostril daily as well as Zyrtec and Singulair.  We did make the decision to start [place] for her nasal polyposis.  We obtained labs to see if we can get it approved for asthma and her absolute eosinophil count was 500.  Since last visit, she has done very well.  She receives all of her Dupixent injections here at the office because she is quite squeamish about giving it to her self.  She has noted that she has been able to work out more and walk more than 10 to 15 minutes.  She feels like a new person.  She is planning to do a 5K with her daughters in December.  Asthma/Respiratory Symptom History: She is on Symbicort two puffs twice daily. She has not been using her albuterol at all. Jillian Vasquez's asthma has been well controlled. She has not required rescue medication, experienced nocturnal awakenings due to lower respiratory symptoms, nor have activities of daily living been limited. She has required no Emergency Department or Urgent Care visits for her asthma. She has required zero courses of systemic  steroids for asthma exacerbations since the last visit. ACT score today is 25, indicating excellent asthma symptom control.   Allergic Rhinitis Symptom History: She is doing well with her environmental allergies. She is not using her allergy pills or nose sprays.  She has been able to breathe easier through her nose.  She is sleeping better without awakening at night.  She wakes up with more energy  She continues to work in [place].  She seems to enjoy her job a lot.  Otherwise, there have been no changes to her  past medical history, surgical history, family history, or social history.    Review of Systems  Constitutional: Negative.  Negative for chills, fever, malaise/fatigue and weight loss.  HENT:  Positive for congestion. Negative for ear discharge, ear pain and sinus pain.   Eyes:  Negative for pain, discharge and redness.  Respiratory:  Negative for cough, sputum production, shortness of breath and wheezing.   Cardiovascular: Negative.  Negative for chest pain and palpitations.  Gastrointestinal:  Negative for abdominal pain, constipation, diarrhea, heartburn, nausea and vomiting.  Skin: Negative.  Negative for itching and rash.  Neurological:  Negative for dizziness and headaches.  Endo/Heme/Allergies:  Positive for environmental allergies. Does not bruise/bleed easily.      Objective:   Blood pressure 140/90, pulse 81, temperature 98.7 F (37.1 C), temperature source Temporal, resp. rate 18, height 5\' 1"  (1.549 m), weight 189 lb 6.4 oz (85.9 kg), SpO2 100 %. Body mass index is 35.79 kg/m.   Physical Exam:  Physical Exam Vitals reviewed.  Constitutional:      Appearance: She is well-developed.     Comments: Talkative.  Joyful.  HENT:     Head: Normocephalic and atraumatic.     Right Ear: Tympanic membrane, ear canal and external ear normal. No drainage, swelling or tenderness. Tympanic membrane is not injected, scarred, erythematous, retracted or bulging.     Left Ear: Tympanic membrane, ear canal and external ear normal. No drainage, swelling or tenderness. Tympanic membrane is not injected, scarred, erythematous, retracted or bulging.     Nose: Rhinorrhea present. No nasal deformity, septal deviation or mucosal edema.     Right Turbinates: Enlarged, swollen and pale.     Left Turbinates: Enlarged, swollen and pale.     Right Sinus: No maxillary sinus tenderness or frontal sinus tenderness.     Left Sinus: No maxillary sinus tenderness or frontal sinus tenderness.      Comments: Turbinates markedly enlarged.  The nasal polyp on the left has resolved to obstructing around 25% of the nasal passage only.  The nasal polyp on the right is slightly larger than the one on the left, but overall, both polyps are markedly improved.    Mouth/Throat:     Mouth: Mucous membranes are not pale and not dry.     Pharynx: Uvula midline.  Eyes:     General:        Right eye: No discharge.        Left eye: No discharge.     Conjunctiva/sclera: Conjunctivae normal.     Right eye: Right conjunctiva is not injected. No chemosis.    Left eye: Left conjunctiva is not injected. No chemosis.    Pupils: Pupils are equal, round, and reactive to light.  Cardiovascular:     Rate and Rhythm: Normal rate and regular rhythm.     Heart sounds: Normal heart sounds.  Pulmonary:     Effort: Pulmonary effort is normal. No tachypnea,  accessory muscle usage or respiratory distress.     Breath sounds: Normal breath sounds. No wheezing, rhonchi or rales.     Comments: Moving air well in all lung fields. No increased work of breathing.  Chest:     Chest wall: No tenderness.  Abdominal:     Tenderness: There is no abdominal tenderness. There is no guarding or rebound.  Lymphadenopathy:     Head:     Right side of head: No submandibular, tonsillar or occipital adenopathy.     Left side of head: No submandibular, tonsillar or occipital adenopathy.     Cervical: No cervical adenopathy.  Skin:    General: Skin is warm.     Capillary Refill: Capillary refill takes less than 2 seconds.     Coloration: Skin is not pale.     Findings: No abrasion, erythema, petechiae or rash. Rash is not papular, urticarial or vesicular.  Neurological:     Mental Status: She is alert.     Diagnostic studies:    Spirometry: results normal (FEV1: 1.78/79%, FVC: 2.70/98%, FEV1/FVC: 66%).    Spirometry consistent with mild obstructive disease.    Allergy Studies: none        Salvatore Marvel, MD  Allergy  and [facility]

## 2021-09-13 ENCOUNTER — Ambulatory Visit: Payer: Self-pay | Admitting: Surgery

## 2021-09-13 DIAGNOSIS — N6323 Unspecified lump in the left breast, lower outer quadrant: Secondary | ICD-10-CM

## 2021-09-13 DIAGNOSIS — Z9189 Other specified personal risk factors, not elsewhere classified: Secondary | ICD-10-CM | POA: Diagnosis not present

## 2021-09-13 DIAGNOSIS — N6321 Unspecified lump in the left breast, upper outer quadrant: Secondary | ICD-10-CM | POA: Diagnosis not present

## 2021-09-14 ENCOUNTER — Other Ambulatory Visit: Payer: Self-pay | Admitting: *Deleted

## 2021-09-14 MED ORDER — AMLODIPINE BESYLATE 10 MG PO TABS: 10.0000 mg | ORAL_TABLET | Freq: Every day | ORAL | 3 refills | Status: DC

## 2021-09-15 ENCOUNTER — Other Ambulatory Visit: Payer: Self-pay | Admitting: Surgery

## 2021-09-15 DIAGNOSIS — N6323 Unspecified lump in the left breast, lower outer quadrant: Secondary | ICD-10-CM

## 2021-09-23 ENCOUNTER — Ambulatory Visit: Payer: BC Managed Care – PPO

## 2021-09-29 ENCOUNTER — Ambulatory Visit (INDEPENDENT_AMBULATORY_CARE_PROVIDER_SITE_OTHER): Payer: BC Managed Care – PPO

## 2021-09-29 ENCOUNTER — Other Ambulatory Visit: Payer: Self-pay

## 2021-09-29 DIAGNOSIS — J4540 Moderate persistent asthma, uncomplicated: Secondary | ICD-10-CM

## 2021-10-05 ENCOUNTER — Telehealth: Payer: Self-pay

## 2021-10-05 ENCOUNTER — Ambulatory Visit: Payer: BC Managed Care – PPO | Admitting: Family Medicine

## 2021-10-05 ENCOUNTER — Other Ambulatory Visit: Payer: Self-pay

## 2021-10-05 VITALS — BP 152/70 | HR 75 | Ht 61.0 in | Wt 188.4 lb

## 2021-10-05 DIAGNOSIS — R1032 Left lower quadrant pain: Secondary | ICD-10-CM

## 2021-10-05 LAB — POCT URINALYSIS DIP (MANUAL ENTRY)
Bilirubin, UA: NEGATIVE
Glucose, UA: NEGATIVE mg/dL
Ketones, POC UA: NEGATIVE mg/dL
Leukocytes, UA: NEGATIVE
Nitrite, UA: NEGATIVE
Protein Ur, POC: NEGATIVE mg/dL
Spec Grav, UA: 1.02 (ref 1.010–1.025)
Urobilinogen, UA: 0.2 E.U./dL
pH, UA: 7 (ref 5.0–8.0)

## 2021-10-05 LAB — POCT UA - MICROSCOPIC ONLY

## 2021-10-05 LAB — POCT URINE PREGNANCY: Preg Test, Ur: NEGATIVE

## 2021-10-05 NOTE — Patient Instructions (Addendum)
It was great seeing you today!  You came in for abdominal pain and we are doing some blood work and checking your urine for potential causes. I will call you with any abnormal results or send a my chart message if normal.   I have scheduled you for a follow up visit to check on your pain on 12/15 at 2:30pm with potential work up if indicated.   Feel free to call with any questions or concerns at any time, at [phone].   Take care,  Dr. Shary Key [facility]

## 2021-10-05 NOTE — Telephone Encounter (Signed)
Patient calls nurse line requesting to schedule same day appointment for LLQ abdominal pain. Reports onset last night. Denies fever, body aches, nausea, vomiting or diarrhea. Patient had small BM this morning. Patient states that initially she thought pain may be related to gas, however, due to persistence, would like evaluation.   Scheduled for appointment this morning at 11:10 with Dr. Arby Barrette.   Talbot Grumbling, RN

## 2021-10-05 NOTE — Progress Notes (Signed)
° ° °  SUBJECTIVE:   CHIEF COMPLAINT / HPI:   Ms. Aerianna Losey is a 45 yo who presents for abdominal pain   Started Sunday night 2 days ago with a sharp LLQ pain. States it is kind of crampy when she moves certain ways or walks.  Denies lifting anything heavy or any inciting injury . Not worse with movement, seems to be constant. Does feel like it wraps around to the L side of her back. Denies having this pain before. States pain is 7/10.  Has not taken anythign for the pain. Has been moving gas fine, normal Bms, no urinary symptoms. Not worsened with eating. Menstrual cycle ended yesterday    OBJECTIVE:   Ht 5\' 1"  (1.549 m)    BMI 35.79 kg/m    Physical exam General: well appearing, NAD Cardiovascular: RRR, no murmurs Lungs: CTAB. Normal WOB Abdomen: soft, non-distended, no erythema or rash. Tender to mild palpation in LUQ and LLQ. No rebound tenderness or guarding.  Skin: warm, dry. No edema   ASSESSMENT/PLAN:   No problem-specific Assessment & Plan notes found for this encounter.   LLQ abdominal pain Patient presents for sharp LLQ pain 2 days in duration. Denies inciting injury. No urinary symptoms, diarrhea, constipation, fever. On exam tender to mild palpation without rebound tenderness. No erythema or skin changes. Flank region non tender. Potentially MSK related with abdominal strain given the pain on movement. Less likely to be renal stones without urinary symptoms but will check UA and CMP. Will also check Upreg to rule out ectopic pregnancy. Considered ovarian torsion but less likely given constant sharp pain over 2 days and not intermittent and of shorter duration. Physical exam reassuring against acute abdomen. Recommended supportive care and gave strict return precautions. Scheduled follow up appt for 12/15 if symptoms do not improve.    [place]

## 2021-10-06 ENCOUNTER — Encounter: Payer: Self-pay | Admitting: Family Medicine

## 2021-10-06 LAB — COMPREHENSIVE METABOLIC PANEL
ALT: 10 IU/L (ref 0–32)
AST: 10 IU/L (ref 0–40)
Albumin/Globulin Ratio: 1.7 (ref 1.2–2.2)
Albumin: 4.3 g/dL (ref 3.8–4.8)
Alkaline Phosphatase: 77 IU/L (ref 44–121)
BUN/Creatinine Ratio: 9 (ref 9–23)
BUN: 11 mg/dL (ref 6–24)
Bilirubin Total: <0.2 mg/dL (ref 0.0–1.2)
CO2: 21 mmol/L (ref 20–29)
Calcium: 8.7 mg/dL (ref 8.7–10.2)
Chloride: 105 mmol/L (ref 96–106)
Creatinine, Ser: 1.17 mg/dL — ABNORMAL HIGH (ref 0.57–1.00)
Globulin, Total: 2.5 g/dL (ref 1.5–4.5)
Glucose: 91 mg/dL (ref 70–99)
Potassium: 4.3 mmol/L (ref 3.5–5.2)
Sodium: 140 mmol/L (ref 134–144)
Total Protein: 6.8 g/dL (ref 6.0–8.5)
eGFR: 59 mL/min/{1.73_m2} — ABNORMAL LOW (ref >59)

## 2021-10-06 LAB — CBC
Hematocrit: 37.3 % (ref 34.0–46.6)
Hemoglobin: 11.8 g/dL (ref 11.1–15.9)
MCH: 25.3 pg — ABNORMAL LOW (ref 26.6–33.0)
MCHC: 31.6 g/dL (ref 31.5–35.7)
MCV: 80 fL (ref 79–97)
Platelets: 252 10*3/uL (ref 150–450)
RBC: 4.67 x10E6/uL (ref 3.77–5.28)
RDW: 15.1 % (ref 11.7–15.4)
WBC: 6.7 10*3/uL (ref 3.4–10.8)

## 2021-10-07 ENCOUNTER — Other Ambulatory Visit: Payer: Self-pay

## 2021-10-07 ENCOUNTER — Ambulatory Visit: Payer: BC Managed Care – PPO | Admitting: Family Medicine

## 2021-10-07 DIAGNOSIS — R1032 Left lower quadrant pain: Secondary | ICD-10-CM | POA: Diagnosis not present

## 2021-10-07 NOTE — Patient Instructions (Signed)
You are constipated and need help to clean out the large amount of stool (poop) in the intestine. This guide tells you what medicine to use.  What do I need to know before starting the clean out?  It will take about 4 to 6 hours to take the medicine.  After taking the medicine, you should have a large stool within 24 hours.  Plan to stay close to a bathroom until the stool has passed. After the intestine is cleaned out, you will need to take a daily medicine.   Remember:  Constipation can last a long time. It may take 6 to 12 months for you to get back to regular bowel movements (BMs). Be patient. Things will get better slowly over time.  If you have questions, call your doctor at this number:     [phone]   When should you start the clean out?  Start the home clean out on a Friday afternoon or some other time when you will be home (and not at school).  Start between 2:00 and 4:00 in the afternoon.  You should have almost clear liquid stools by the end of the next day. If the medicine does not work or you dont know if it worked, Statistician.  What medicine do I need to take?  You need to take Miralax, a powder that you mix in a clear liquid.  Follow these steps: ?    Stir the Miralax powder into water, juice, or Gatorade. Your Miralax dose is: 8 capfuls of Miralax powder in 32 ounces of liquid. Take 1 tablet of senna twice daily.  ?    Drink 4 to 8 ounces every 30 minutes. It will take 4 to 6 hours to finish the medicine. ?    After the medicine is gone, drink more water or juice. This will help with the cleanout.   -     If the medicine gives you an upset stomach, slow down or stop.   Do I need to keep taking medicine?                                                                                                      After the clean out, you will take a daily (maintenance) medicine for at least 6 months. Your Miralax dose is:    1  capful of powder in 8  ounces of liquid every day   You should go to the doctor for follow-up appointments as directed.  What if I get constipated again?  Some people need to have the clean out more than one time for the problem to go away. Contact your doctor to ask if you should repeat the clean out. It is OK to do it again, but you should wait at least a week before repeating the clean out.    Will I have any problems with the medicine?   You may have stomach pain or cramping during the clean out. This might mean you have to go to the bathroom.  Take some time to sit on the toilet. The pain will go away when the stool is gone. You may want to read while you wait. A warm bath may also help.   What should I eat and drink?  Drink lots of water and juice. Fruits and vegetables are good foods to eat. Try to avoid greasy and fatty foods. Goal is to get 25-35 grams of fiber daily.

## 2021-10-07 NOTE — Progress Notes (Signed)
° °  SUBJECTIVE:   CHIEF COMPLAINT / HPI:   Jillian Vasquez is a 45 y.o. female here for LLQ abdominal pain.  Continue having constant LLQ pain since Sunday.  Pain described as soreness and rated 7/10. Taking tylenol without relief. Was seen at [facility] two days ago for similar pain without etiology.  She was to follow up today. She has had hard pellet like stools intermittently.  She occasionally has to push to get stool out. Gas-Ex provided no relief. No blood in stool, dysuria, vomiting or diarrhea. No recent illness.  Patient's last menstrual period was 10/04/2021 (exact date).    PERTINENT  PMH / PSH: reviewed and updated as appropriate   OBJECTIVE:   BP 135/82    Pulse 78    Ht 5\' 1"  (1.549 m)    Wt 186 lb 3.2 oz (84.5 kg)    LMP 10/04/2021 (Exact Date)    SpO2 100%    BMI 35.18 kg/m    GEN: pleasant well appearing female, in no acute distress  CV: regular rate and rhythm RESP: no increased work of breathing, clear to ascultation bilaterally ABD: Bowel sounds present in all quadrants. Very soft, distended abdomen, LLQ tenderness, no palpable masses  SKIN: warm, dry, no rash on abdomen     ASSESSMENT/PLAN:   LLQ abdominal pain Pt is a 46 yo female with LLQ pain and tenderness on exam.  Pt has negative labs evaluation including urine pregnancy, CBC, CMP and UA at previous visit this week. DDx constipation, diverticular disease, abdominal muscle strain, hemorrhagic ovarian cyst given pt recently completed menses. History and exam most concerning for constipation. Discussed pain as it relates to constipation.  Offered KUB but pt declined as she too feels she may be constipated after our discussion.  Instructions provided for constipation clean out and maintenance thereafter. See AVS. Return and ED precautions provided.      Lyndee Hensen, DO PGY-3, [facility] 10/08/2021

## 2021-10-08 DIAGNOSIS — R1032 Left lower quadrant pain: Secondary | ICD-10-CM | POA: Insufficient documentation

## 2021-10-08 DIAGNOSIS — G4733 Obstructive sleep apnea (adult) (pediatric): Secondary | ICD-10-CM | POA: Diagnosis not present

## 2021-10-08 NOTE — Assessment & Plan Note (Addendum)
Pt is a 45 yo female with LLQ pain and tenderness on exam.  Pt has negative labs evaluation including urine pregnancy, CBC, CMP and UA at previous visit this week. DDx constipation, diverticular disease, abdominal muscle strain, hemorrhagic ovarian cyst given pt recently completed menses. History and exam most concerning for constipation. Discussed pain as it relates to constipation.  Offered KUB but pt declined as she too feels she may be constipated after our discussion.  Instructions provided for constipation clean out and maintenance thereafter. See AVS. Return and ED precautions provided.

## 2021-10-13 ENCOUNTER — Ambulatory Visit: Payer: Self-pay

## 2021-10-14 ENCOUNTER — Ambulatory Visit: Payer: BC Managed Care – PPO

## 2021-11-05 ENCOUNTER — Ambulatory Visit (INDEPENDENT_AMBULATORY_CARE_PROVIDER_SITE_OTHER): Payer: BC Managed Care – PPO

## 2021-11-05 ENCOUNTER — Other Ambulatory Visit: Payer: Self-pay

## 2021-11-05 DIAGNOSIS — J4540 Moderate persistent asthma, uncomplicated: Secondary | ICD-10-CM

## 2021-11-08 ENCOUNTER — Other Ambulatory Visit: Payer: Self-pay

## 2021-11-08 ENCOUNTER — Encounter (HOSPITAL_BASED_OUTPATIENT_CLINIC_OR_DEPARTMENT_OTHER): Payer: Self-pay | Admitting: Surgery

## 2021-11-08 DIAGNOSIS — G4733 Obstructive sleep apnea (adult) (pediatric): Secondary | ICD-10-CM | POA: Diagnosis not present

## 2021-11-12 NOTE — Progress Notes (Signed)

## 2021-11-16 ENCOUNTER — Other Ambulatory Visit: Payer: Self-pay

## 2021-11-16 ENCOUNTER — Ambulatory Visit
Admission: RE | Admit: 2021-11-16 | Discharge: 2021-11-16 | Disposition: A | Discharge status: Home / Self Care | Payer: BC Managed Care – PPO | Source: Ambulatory Visit | Attending: Surgery | Admitting: Surgery

## 2021-11-16 DIAGNOSIS — N6323 Unspecified lump in the left breast, lower outer quadrant: Secondary | ICD-10-CM

## 2021-11-16 DIAGNOSIS — R928 Other abnormal and inconclusive findings on diagnostic imaging of breast: Secondary | ICD-10-CM | POA: Diagnosis not present

## 2021-11-17 ENCOUNTER — Ambulatory Visit (HOSPITAL_BASED_OUTPATIENT_CLINIC_OR_DEPARTMENT_OTHER): Payer: BC Managed Care – PPO | Admitting: Certified Registered"

## 2021-11-17 ENCOUNTER — Ambulatory Visit (HOSPITAL_BASED_OUTPATIENT_CLINIC_OR_DEPARTMENT_OTHER)
Admission: RE | Admit: 2021-11-17 | Discharge: 2021-11-17 | Disposition: A | Discharge status: Home / Self Care | Payer: BC Managed Care – PPO | Attending: Surgery | Admitting: Surgery

## 2021-11-17 ENCOUNTER — Encounter (HOSPITAL_BASED_OUTPATIENT_CLINIC_OR_DEPARTMENT_OTHER)
Admission: RE | Disposition: A | Discharge status: Home / Self Care | Payer: Self-pay | Source: Home / Self Care | Attending: Surgery

## 2021-11-17 ENCOUNTER — Other Ambulatory Visit: Payer: Self-pay

## 2021-11-17 ENCOUNTER — Ambulatory Visit
Admission: RE | Admit: 2021-11-17 | Discharge: 2021-11-17 | Disposition: A | Discharge status: Home / Self Care | Payer: BC Managed Care – PPO | Source: Ambulatory Visit | Attending: Surgery | Admitting: Surgery

## 2021-11-17 ENCOUNTER — Encounter (HOSPITAL_BASED_OUTPATIENT_CLINIC_OR_DEPARTMENT_OTHER): Payer: Self-pay | Admitting: Surgery

## 2021-11-17 DIAGNOSIS — D485 Neoplasm of uncertain behavior of skin: Secondary | ICD-10-CM | POA: Diagnosis not present

## 2021-11-17 DIAGNOSIS — G4730 Sleep apnea, unspecified: Secondary | ICD-10-CM | POA: Diagnosis not present

## 2021-11-17 DIAGNOSIS — J45909 Unspecified asthma, uncomplicated: Secondary | ICD-10-CM | POA: Diagnosis not present

## 2021-11-17 DIAGNOSIS — N6323 Unspecified lump in the left breast, lower outer quadrant: Secondary | ICD-10-CM

## 2021-11-17 DIAGNOSIS — N6321 Unspecified lump in the left breast, upper outer quadrant: Secondary | ICD-10-CM | POA: Diagnosis not present

## 2021-11-17 DIAGNOSIS — I10 Essential (primary) hypertension: Secondary | ICD-10-CM | POA: Insufficient documentation

## 2021-11-17 DIAGNOSIS — N6320 Unspecified lump in the left breast, unspecified quadrant: Secondary | ICD-10-CM | POA: Diagnosis not present

## 2021-11-17 DIAGNOSIS — Z6833 Body mass index (BMI) 33.0-33.9, adult: Secondary | ICD-10-CM | POA: Diagnosis not present

## 2021-11-17 DIAGNOSIS — E669 Obesity, unspecified: Secondary | ICD-10-CM | POA: Diagnosis not present

## 2021-11-17 DIAGNOSIS — R928 Other abnormal and inconclusive findings on diagnostic imaging of breast: Secondary | ICD-10-CM | POA: Diagnosis not present

## 2021-11-17 DIAGNOSIS — Z803 Family history of malignant neoplasm of breast: Secondary | ICD-10-CM | POA: Insufficient documentation

## 2021-11-17 DIAGNOSIS — N6022 Fibroadenosis of left breast: Secondary | ICD-10-CM | POA: Diagnosis not present

## 2021-11-17 HISTORY — DX: Sleep apnea, unspecified: G47.30

## 2021-11-17 HISTORY — PX: BREAST LUMPECTOMY WITH RADIOACTIVE SEED LOCALIZATION: SHX6424

## 2021-11-17 LAB — POCT PREGNANCY, URINE: Preg Test, Ur: NEGATIVE

## 2021-11-17 SURGERY — BREAST LUMPECTOMY WITH RADIOACTIVE SEED LOCALIZATION
Anesthesia: General | Site: Breast | Laterality: Left

## 2021-11-17 MED ORDER — OXYCODONE HCL 5 MG PO TABS: 5.0000 mg | ORAL_TABLET | Freq: Four times a day (QID) | ORAL | 0 refills | Status: DC | PRN

## 2021-11-17 MED ORDER — PROPOFOL 500 MG/50ML IV EMUL
INTRAVENOUS | Status: DC | PRN
  Administered 2021-11-17: 25 ug/kg/min via INTRAVENOUS

## 2021-11-17 MED ORDER — SODIUM CHLORIDE 0.9 % IV SOLN
INTRAVENOUS | Status: AC
  Filled 2021-11-17 (×4): qty 10

## 2021-11-17 MED ORDER — BUPIVACAINE-EPINEPHRINE (PF) 0.25% -1:200000 IJ SOLN
INTRAMUSCULAR | Status: AC
  Filled 2021-11-17: qty 120

## 2021-11-17 MED ORDER — CEFAZOLIN SODIUM-DEXTROSE 2-4 GM/100ML-% IV SOLN
2.0000 g | INTRAVENOUS | Status: AC
  Administered 2021-11-17: 09:00:00 2 g via INTRAVENOUS

## 2021-11-17 MED ORDER — SODIUM CHLORIDE 0.9 % IV SOLN
INTRAVENOUS | Status: DC | PRN
  Administered 2021-11-17: 09:00:00 500 mL

## 2021-11-17 MED ORDER — CEFAZOLIN SODIUM-DEXTROSE 2-4 GM/100ML-% IV SOLN
INTRAVENOUS | Status: AC
  Filled 2021-11-17: qty 100

## 2021-11-17 MED ORDER — BUPIVACAINE-EPINEPHRINE (PF) 0.25% -1:200000 IJ SOLN
INTRAMUSCULAR | Status: DC | PRN
Start: 2021-11-17 — End: 2021-11-17
  Administered 2021-11-17: 17 mL

## 2021-11-17 MED ORDER — CHLORHEXIDINE GLUCONATE CLOTH 2 % EX PADS: 6.0000 | MEDICATED_PAD | Freq: Once | CUTANEOUS | Status: DC

## 2021-11-17 MED ORDER — FENTANYL CITRATE (PF) 100 MCG/2ML IJ SOLN
INTRAMUSCULAR | Status: AC
  Filled 2021-11-17: qty 2

## 2021-11-17 MED ORDER — IBUPROFEN 800 MG PO TABS: 800.0000 mg | ORAL_TABLET | Freq: Three times a day (TID) | ORAL | 0 refills | Status: DC | PRN

## 2021-11-17 MED ORDER — PROMETHAZINE HCL 25 MG/ML IJ SOLN: 6.2500 mg | INTRAMUSCULAR | Status: DC | PRN

## 2021-11-17 MED ORDER — ONDANSETRON HCL 4 MG/2ML IJ SOLN
INTRAMUSCULAR | Status: DC | PRN
  Administered 2021-11-17: 4 mg via INTRAVENOUS

## 2021-11-17 MED ORDER — OXYCODONE HCL 5 MG/5ML PO SOLN: 5.0000 mg | Freq: Once | ORAL | Status: DC | PRN

## 2021-11-17 MED ORDER — LIDOCAINE HCL (CARDIAC) PF 100 MG/5ML IV SOSY
PREFILLED_SYRINGE | INTRAVENOUS | Status: DC | PRN
  Administered 2021-11-17: 60 mg via INTRAVENOUS

## 2021-11-17 MED ORDER — HYDROMORPHONE HCL 1 MG/ML IJ SOLN: 0.2500 mg | INTRAMUSCULAR | Status: DC | PRN

## 2021-11-17 MED ORDER — AMISULPRIDE (ANTIEMETIC) 5 MG/2ML IV SOLN: 10.0000 mg | Freq: Once | INTRAVENOUS | Status: DC | PRN

## 2021-11-17 MED ORDER — MIDAZOLAM HCL 2 MG/2ML IJ SOLN
INTRAMUSCULAR | Status: AC
  Filled 2021-11-17: qty 2

## 2021-11-17 MED ORDER — LACTATED RINGERS IV SOLN: INTRAVENOUS | Status: DC

## 2021-11-17 MED ORDER — ACETAMINOPHEN 500 MG PO TABS
ORAL_TABLET | ORAL | Status: AC
  Filled 2021-11-17: qty 1

## 2021-11-17 MED ORDER — OXYCODONE HCL 5 MG PO TABS: 5.0000 mg | ORAL_TABLET | Freq: Once | ORAL | Status: DC | PRN

## 2021-11-17 MED ORDER — FENTANYL CITRATE (PF) 100 MCG/2ML IJ SOLN
INTRAMUSCULAR | Status: DC | PRN
  Administered 2021-11-17 (×2): 25 ug via INTRAVENOUS
  Administered 2021-11-17: 50 ug via INTRAVENOUS
  Administered 2021-11-17: 25 ug via INTRAVENOUS

## 2021-11-17 MED ORDER — PROPOFOL 10 MG/ML IV BOLUS
INTRAVENOUS | Status: DC | PRN
  Administered 2021-11-17: 200 mg via INTRAVENOUS

## 2021-11-17 MED ORDER — DEXAMETHASONE SODIUM PHOSPHATE 4 MG/ML IJ SOLN
INTRAMUSCULAR | Status: DC | PRN
  Administered 2021-11-17: 4 mg via INTRAVENOUS

## 2021-11-17 MED ORDER — ACETAMINOPHEN 500 MG PO TABS
1000.0000 mg | ORAL_TABLET | ORAL | Status: AC
  Administered 2021-11-17: 07:00:00 1000 mg via ORAL

## 2021-11-17 MED ORDER — MIDAZOLAM HCL 5 MG/5ML IJ SOLN
INTRAMUSCULAR | Status: DC | PRN
  Administered 2021-11-17: 2 mg via INTRAVENOUS

## 2021-11-17 MED ORDER — MEPERIDINE HCL 25 MG/ML IJ SOLN: 6.2500 mg | INTRAMUSCULAR | Status: DC | PRN

## 2021-11-17 SURGICAL SUPPLY — 54 items
ADH SKN CLS APL DERMABOND .7 (GAUZE/BANDAGES/DRESSINGS) ×1
APL PRP STRL LF DISP 70% ISPRP (MISCELLANEOUS) ×1
APPLIER CLIP 9.375 MED OPEN (MISCELLANEOUS)
APR CLP MED 9.3 20 MLT OPN (MISCELLANEOUS)
BAG DECANTER FOR FLEXI CONT (MISCELLANEOUS) ×1 IMPLANT
BINDER BREAST LRG (GAUZE/BANDAGES/DRESSINGS) IMPLANT
BINDER BREAST MEDIUM (GAUZE/BANDAGES/DRESSINGS) IMPLANT
BINDER BREAST XLRG (GAUZE/BANDAGES/DRESSINGS) ×1 IMPLANT
BINDER BREAST XXLRG (GAUZE/BANDAGES/DRESSINGS) IMPLANT
BLADE SURG 15 STRL LF DISP TIS (BLADE) ×1 IMPLANT
BLADE SURG 15 STRL SS (BLADE) ×2
CANISTER SUC SOCK COL 7IN (MISCELLANEOUS) IMPLANT
CANISTER SUCT 1200ML W/VALVE (MISCELLANEOUS) IMPLANT
CHLORAPREP W/TINT 26 (MISCELLANEOUS) ×2 IMPLANT
CLIP APPLIE 9.375 MED OPEN (MISCELLANEOUS) IMPLANT
COVER BACK TABLE 60X90IN (DRAPES) ×2 IMPLANT
COVER MAYO STAND STRL (DRAPES) ×2 IMPLANT
COVER PROBE W GEL 5X96 (DRAPES) ×2 IMPLANT
DECANTER SPIKE VIAL GLASS SM (MISCELLANEOUS) IMPLANT
DERMABOND ADVANCED (GAUZE/BANDAGES/DRESSINGS) ×1
DERMABOND ADVANCED .7 DNX12 (GAUZE/BANDAGES/DRESSINGS) ×1 IMPLANT
DRAPE LAPAROSCOPIC ABDOMINAL (DRAPES) IMPLANT
DRAPE LAPAROTOMY 100X72 PEDS (DRAPES) ×2 IMPLANT
DRAPE UTILITY XL STRL (DRAPES) ×2 IMPLANT
ELECT COATED BLADE 2.86 ST (ELECTRODE) ×2 IMPLANT
ELECT REM PT RETURN 9FT ADLT (ELECTROSURGICAL) ×2
ELECTRODE REM PT RTRN 9FT ADLT (ELECTROSURGICAL) ×1 IMPLANT
GLOVE SRG 8 PF TXTR STRL LF DI (GLOVE) ×1 IMPLANT
GLOVE SURG LTX SZ8 (GLOVE) ×2 IMPLANT
GLOVE SURG UNDER POLY LF SZ6.5 (GLOVE) ×1 IMPLANT
GLOVE SURG UNDER POLY LF SZ7 (GLOVE) ×1 IMPLANT
GLOVE SURG UNDER POLY LF SZ8 (GLOVE) ×2
GOWN STRL REUS W/ TWL LRG LVL3 (GOWN DISPOSABLE) ×2 IMPLANT
GOWN STRL REUS W/ TWL XL LVL3 (GOWN DISPOSABLE) ×1 IMPLANT
GOWN STRL REUS W/TWL LRG LVL3 (GOWN DISPOSABLE)
GOWN STRL REUS W/TWL XL LVL3 (GOWN DISPOSABLE) ×2
HEMOSTAT ARISTA ABSORB 3G PWDR (HEMOSTASIS) IMPLANT
HEMOSTAT SNOW SURGICEL 2X4 (HEMOSTASIS) IMPLANT
KIT MARKER MARGIN INK (KITS) ×2 IMPLANT
NDL HYPO 25X1 1.5 SAFETY (NEEDLE) ×1 IMPLANT
NEEDLE HYPO 25X1 1.5 SAFETY (NEEDLE) ×2 IMPLANT
NS IRRIG 1000ML POUR BTL (IV SOLUTION) ×1 IMPLANT
PACK BASIN DAY SURGERY FS (CUSTOM PROCEDURE TRAY) ×2 IMPLANT
PENCIL SMOKE EVACUATOR (MISCELLANEOUS) ×2 IMPLANT
SLEEVE SCD COMPRESS KNEE MED (STOCKING) ×2 IMPLANT
SPONGE T-LAP 4X18 ~~LOC~~+RFID (SPONGE) ×2 IMPLANT
SUT MNCRL AB 4-0 PS2 18 (SUTURE) ×2 IMPLANT
SUT SILK 2 0 SH (SUTURE) IMPLANT
SUT VICRYL 3-0 CR8 SH (SUTURE) ×2 IMPLANT
SYR CONTROL 10ML LL (SYRINGE) ×2 IMPLANT
TOWEL GREEN STERILE FF (TOWEL DISPOSABLE) ×2 IMPLANT
TRAY FAXITRON CT DISP (TRAY / TRAY PROCEDURE) ×2 IMPLANT
TUBE CONNECTING 20X1/4 (TUBING) IMPLANT
YANKAUER SUCT BULB TIP NO VENT (SUCTIONS) IMPLANT

## 2021-11-17 NOTE — Op Note (Signed)
Preoperative diagnosis: Left breast mass with discordant biopsy and imaging results upper outer quadrant  Postop diagnosis: Same  Procedure: Left breast seed localized lumpectomy  Surgeon: Erroll Luna, MD  Anesthesia: General with 0.25% Marcaine plain  EBL: Minimal  Specimen: Left breast mass with seed and clip verified by Faxitron  Drains: None  Indications for procedure: The patient is a 46 year old female who had a left breast mass detected on mammogram.  Core biopsy showed benign findings this felt to be discordant and excision recommended.  She presents today for excision of discordant breast mass.  The procedure has been discussed with the patient. Alternatives to surgery have been discussed with the patient.  Risks of surgery include bleeding,  Infection,  Seroma formation, death,  and the need for further surgery.   The patient understands and wishes to proceed.    Description of procedure: The patient was met in the holding area and questions were answered.  Left breast was marked as correct site and films were available for review.  The procedure was discussed.  She was then taken the operating placed supine upon the OR table.  After induction of general esthesia, left breast was prepped and draped in sterile fashion timeout performed.  Proper patient, site and procedure verified.  Neoprobe used to find the seed left breast upper outer quadrant.  Curvilinear incision was made along the inferior border of the nipple areolar complex.  Dissection was carried down all tissue and the seed and clip were excised with a grossly negative margin.  Hemostasis was achieved.  Local anesthetic was infiltrated throughout the cavity.  Wound was then closed the deep layer 3-0 Vicryl.  4 Monocryl used to close skin.  All counts found to be correct.  Dermabond applied.  Breast binder placed.  The patient was awoke extubated taken to recovery in satisfactory condition.

## 2021-11-17 NOTE — Discharge Instructions (Addendum)
Took tylenol at 7am. No tylenol products till after 11am today   Post Anesthesia Home Care Instructions  Activity: Get plenty of rest for the remainder of the day. A responsible individual must stay with you for 24 hours following the procedure.  For the next 24 hours, DO NOT: -Drive a car -Paediatric nurse -Drink alcoholic beverages -Take any medication unless instructed by your physician -Make any legal decisions or sign important papers.  Meals: Start with liquid foods such as gelatin or soup. Progress to regular foods as tolerated. Avoid greasy, spicy, heavy foods. If nausea and/or vomiting occur, drink only clear liquids until the nausea and/or vomiting subsides. Call your physician if vomiting continues.  Special Instructions/Symptoms: Your throat may feel dry or sore from the anesthesia or the breathing tube placed in your throat during surgery. If this causes discomfort, gargle with warm salt water. The discomfort should disappear within 24 hours.  If you had a scopolamine patch placed behind your ear for the management of post- operative nausea and/or vomiting:  1. The medication in the patch is effective for 72 hours, after which it should be removed.  Wrap patch in a tissue and discard in the trash. Wash hands thoroughly with soap and water. 2. You may remove the patch earlier than 72 hours if you experience unpleasant side effects which may include dry mouth, dizziness or visual disturbances. 3. Avoid touching the patch. Wash your hands with soap and water after contact with the patch.            [place] Office Phone Number [phone]  BREAST BIOPSY/ PARTIAL MASTECTOMY: POST OP INSTRUCTIONS  Always review your discharge instruction sheet given to you by the facility where your surgery was performed.  IF YOU HAVE DISABILITY OR FAMILY LEAVE FORMS, YOU MUST BRING THEM TO THE OFFICE FOR PROCESSING.  DO NOT GIVE THEM TO YOUR DOCTOR.  A  prescription for pain medication may be given to you upon discharge.  Take your pain medication as prescribed, if needed.  If narcotic pain medicine is not needed, then you may take acetaminophen (Tylenol) or ibuprofen (Advil) as needed. Take your usually prescribed medications unless otherwise directed If you need a refill on your pain medication, please contact your pharmacy.  They will contact our office to request authorization.  Prescriptions will not be filled after 5pm or on week-ends. You should eat very light the first 24 hours after surgery, such as soup, crackers, pudding, etc.  Resume your normal diet the day after surgery. Most patients will experience some swelling and bruising in the breast.  Ice packs and a good support bra will help.  Swelling and bruising can take several days to resolve.  It is common to experience some constipation if taking pain medication after surgery.  Increasing fluid intake and taking a stool softener will usually help or prevent this problem from occurring.  A mild laxative (Milk of Magnesia or Miralax) should be taken according to package directions if there are no bowel movements after 48 hours. Unless discharge instructions indicate otherwise, you may remove your bandages 24-48 hours after surgery, and you may shower at that time.  You may have steri-strips (small skin tapes) in place directly over the incision.  These strips should be left on the skin for 7-10 days.  If your surgeon used skin glue on the incision, you may shower in 24 hours.  The glue will flake off over the next 2-3 weeks.  Any sutures or  staples will be removed at the office during your follow-up visit. ACTIVITIES:  You may resume regular daily activities (gradually increasing) beginning the next day.  Wearing a good support bra or sports bra minimizes pain and swelling.  You may have sexual intercourse when it is comfortable. You may drive when you no longer are taking prescription pain  medication, you can comfortably wear a seatbelt, and you can safely maneuver your car and apply brakes. RETURN TO WORK:  ______________________________________________________________________________________ Dennis Bast should see your doctor in the office for a follow-up appointment approximately two weeks after your surgery.  Your doctors nurse will typically make your follow-up appointment when she calls you with your pathology report.  Expect your pathology report 2-3 business days after your surgery.  You may call to check if you do not hear from [place] after three days. OTHER INSTRUCTIONS: _______________________________________________________________________________________________ _____________________________________________________________________________________________________________________________________ _____________________________________________________________________________________________________________________________________ _____________________________________________________________________________________________________________________________________  WHEN TO CALL YOUR DOCTOR: Fever over 101.0 Nausea and/or vomiting. Extreme swelling or bruising. Continued bleeding from incision. Increased pain, redness, or drainage from the incision.  The clinic staff is available to answer your questions during regular business hours.  Please dont hesitate to call and ask to speak to one of the nurses for clinical concerns.  If you have a medical emergency, go to the nearest emergency room or call 911.  A surgeon from [facility] is always on call at the hospital.  For further questions, please visit [email or web address]

## 2021-11-17 NOTE — Anesthesia Procedure Notes (Signed)
Procedure Name: LMA Insertion Date/Time: 11/17/2021 7:42 AM Performed by: Signe Colt, CRNA Pre-anesthesia Checklist: Patient identified, Emergency Drugs available, Suction available and Patient being monitored Patient Re-evaluated:Patient Re-evaluated prior to induction Oxygen Delivery Method: Circle System Utilized Preoxygenation: Pre-oxygenation with 100% oxygen Induction Type: IV induction Ventilation: Mask ventilation without difficulty LMA: LMA inserted LMA Size: 4.0 Number of attempts: 1 Airway Equipment and Method: bite block Placement Confirmation: positive ETCO2 Tube secured with: Tape Dental Injury: Teeth and Oropharynx as per pre-operative assessment

## 2021-11-17 NOTE — H&P (Signed)
History of Present Illness: Jillian Vasquez is a 46 y.o. female who is seen today as an office consultation at the request of Dr. Owens Shark for evaluation of Breast Mass .   Patient sent at the request of the breast center in [facility] due to left breast mass. She underwent biopsy in 2020 one of the density in the left breast upper outer quadrant. The initial biopsy was benign. On this year's film, this had changed in appearance and excision was recommended by the radiologist. She has 3 first-degree relatives with breast cancer on her maternal side in her 27s. She has never undergone genetic testing. This was her first breast biopsy. She had no complaints prior to her mammogram of breast pain, nipple discharge or breast mass.  Review of Systems: A complete review of systems was obtained from the patient. I have reviewed this information and discussed as appropriate with the patient. See HPI as well for other ROS.    Medical History: Past Medical History:  Diagnosis Date   Asthma, unspecified asthma severity, unspecified whether complicated, unspecified whether persistent   Hypertension   There is no problem list on file for this patient.  History reviewed. No pertinent surgical history.   No Known Allergies  Current Outpatient Medications on File Prior to Visit  Medication Sig Dispense Refill   amLODIPine (NORVASC) 10 MG tablet amlodipine 10 mg tablet TK 1 T PO QD   budesonide-formoteroL (SYMBICORT) 80-4.5 mcg/actuation inhaler Inhale into the lungs   losartan (COZAAR) 50 MG tablet Take by mouth   No current facility-administered medications on file prior to visit.   Family History  Problem Relation Age of Onset   High blood pressure (Hypertension) Mother   Deep vein thrombosis (DVT or abnormal blood clot formation) Mother   High blood pressure (Hypertension) Father    Social History   Tobacco Use  Smoking Status Never  Smokeless Tobacco Never    Social History    Socioeconomic History   Marital status: Married  Tobacco Use   Smoking status: Never   Smokeless tobacco: Never   Objective:   Vitals:  09/13/21 0906  BP: (!) 152/96  Pulse: 89  Weight: 84.8 kg (187 lb)  Height: 154.9 cm (5\' 1" )   Body mass index is 35.33 kg/m.  Physical Exam Constitutional:  Appearance: Normal appearance.  HENT:  Head: Normocephalic.  Eyes:  General: No scleral icterus. Pupils: Pupils are equal, round, and reactive to light.  Cardiovascular:  Rate and Rhythm: Normal rate.  Pulmonary:  Effort: Pulmonary effort is normal.  Breath sounds: No stridor.  Chest:  Breasts: Right: Normal.  Left: Normal.  Musculoskeletal:  General: Normal range of motion.  Cervical back: Normal range of motion and neck supple.  Lymphadenopathy:  Upper Body:  Right upper body: No supraclavicular or axillary adenopathy.  Left upper body: No supraclavicular or axillary adenopathy.  Skin: General: Skin is warm and dry.  Neurological:  General: No focal deficit present.  Mental Status: She is alert and oriented to person, place, and time.  Psychiatric:  Mood and Affect: Mood normal.  Behavior: Behavior normal.     Labs, Imaging and Diagnostic Testing: ADDENDUM: Surgical consultation has been arranged with Dr. Erroll Luna at [facility] on September 13, 2021.   Stacie Acres RN on 08/30/2021     Electronically Signed By: Franki Cabot M.D. 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 At that time, patient was recommended for a follow-up LEFT breast diagnostic mammogram in 6 months for which patient presents today.   EXAM: DIGITAL DIAGNOSTIC BILATERAL MAMMOGRAM WITH TOMOSYNTHESIS AND CAD   TECHNIQUE: Bilateral digital diagnostic  mammography and breast tomosynthesis was performed. The images were evaluated with computer-aided detection.   COMPARISON: Previous exam(s).   ACR Breast Density Category c: The breast tissue is heterogeneously dense, which may obscure small masses.   FINDINGS: Bilateral diagnostic mammogram:   LEFT breast: There is slightly increased architectural distortion at the biopsy site, with associated coil shaped clip. There are no new dominant masses, suspicious calcifications or secondary signs of malignancy elsewhere within the LEFT breast.   RIGHT breast: There are no new dominant masses, suspicious calcifications or secondary signs of malignancy within the RIGHT breast.   IMPRESSION: 1. Slightly increased architectural distortion at the biopsy site within the outer LEFT breast, with associated coil shaped clip. This architectural distortion may be accentuated by post biopsy change status post stereotactic biopsy on 08/18/2020 with pathology result of usual ductal hyperplasia. However, occult malignancy or complex sclerosing lesion cannot be excluded as the source of the increased distortion and surgical excision is now recommended for definitive pathologic characterization. 2. No evidence of malignancy within the RIGHT breast.   RECOMMENDATION: Surgical excision for the architectural distortion within the outer LEFT breast, with associated coil shaped clip.   Our breast navigator will help facilitate the scheduling of the surgical consultation at patient's convenience.   I have discussed the findings and recommendations with the patient. If applicable, a reminder letter will be sent to the patient regarding the next appointment.   BI-RADS CATEGORY 4: Suspicious.    Diagnosis Breast, left, needle core biopsy, Upper outter quadrant - FOCAL USUAL DUCTAL HYPERPLASIA - NO MALIGNANCY IDENTIFIED Microscopic Comment These results were called to [facility]  on August 19, 2020. Thressa Sheller MD Pathologist, Electronic Signature (Case signed 08/19/2020)  Assessment and Plan:  Diagnoses and all orders for this visit:  Mass of upper outer quadrant of left breast  At high risk for breast cancer   Discussed genetic testing due to high risk given lifetime risk of breast cancer being over 20% given 3 first-degree relatives in her 110s diagnosed with breast cancer. Recommended consideration for yearly MRI, discussed tamoxifen/raloxifene and other risk reducing strategies including surgery. She will think things over.  Discussion about lumpectomy. Given the changes in the left breast, lumpectomy is recommended seed localization of the left breast. Risks and benefits of surgery discussed. The expectation and rationale for surgery were discussed due to change in size. Also, she has a significant family history of breast cancer on her maternal side. She has agreed for seed localized left breast lumpectomy.The procedure has been discussed with the patient. Alternatives to surgery have been discussed with the patient. Risks of surgery include bleeding, Infection, Seroma formation, death, and the need for further surgery. The patient understands and wishes to proceed.  No follow-ups on file.  Kennieth Francois, MD

## 2021-11-17 NOTE — Interval H&P Note (Signed)
History and Physical Interval Note:  1/73/5670 1:41 AM  Jillian Vasquez  has presented today for surgery, with the diagnosis of LEFT BREAST MASS.  The various methods of treatment have been discussed with the patient and family. After consideration of risks, benefits and other options for treatment, the patient has consented to  Procedure(s): LEFT BREAST LUMPECTOMY WITH RADIOACTIVE SEED LOCALIZATION (Left) as a surgical intervention.  The patient's history has been reviewed, patient examined, no change in status, stable for surgery.  I have reviewed the patient's chart and labs.  Questions were answered to the patient's satisfaction.     [place]

## 2021-11-17 NOTE — Anesthesia Preprocedure Evaluation (Signed)
Anesthesia Evaluation  Patient identified by MRN, date of birth, ID band Patient awake    Reviewed: Allergy & Precautions, NPO status , Patient's Chart, lab work & pertinent test results  History of Anesthesia Complications Negative for: history of anesthetic complications  Airway Mallampati: II  TM Distance: >3 FB Neck ROM: Full    Dental  (+) Dental Advisory Given, Teeth Intact   Pulmonary asthma , sleep apnea ,    breath sounds clear to auscultation       Cardiovascular hypertension, Pt. on medications  Rhythm:Regular Rate:Normal     Neuro/Psych  Headaches, negative psych ROS   GI/Hepatic negative GI ROS, Neg liver ROS,   Endo/Other   Obesity   Renal/GU negative Renal ROS     Musculoskeletal negative musculoskeletal ROS (+)   Abdominal (+) + obese,   Peds  Hematology negative hematology ROS (+)   Anesthesia Other Findings   Reproductive/Obstetrics                             Anesthesia Physical  Anesthesia Plan  ASA: II  Anesthesia Plan: General   Post-op Pain Management:    Induction: Intravenous  PONV Risk Score and Plan: 3 and Treatment may vary due to age or medical condition, Ondansetron, Dexamethasone and Midazolam  Airway Management Planned: LMA  Additional Equipment: None  Intra-op Plan:   Post-operative Plan: Extubation in OR  Informed Consent: I have reviewed the patients History and Physical, chart, labs and discussed the procedure including the risks, benefits and alternatives for the proposed anesthesia with the patient or authorized representative who has indicated his/her understanding and acceptance.     Dental advisory given  Plan Discussed with: CRNA and Anesthesiologist  Anesthesia Plan Comments:         Anesthesia Quick Evaluation

## 2021-11-17 NOTE — Anesthesia Postprocedure Evaluation (Signed)
Anesthesia Post Note  Patient: Jillian Vasquez  Procedure(s) Performed: LEFT BREAST LUMPECTOMY WITH RADIOACTIVE SEED LOCALIZATION (Left: Breast)     Patient location during evaluation: PACU Anesthesia Type: General Level of consciousness: awake and alert Pain management: pain level controlled Vital Signs Assessment: post-procedure vital signs reviewed and stable Respiratory status: spontaneous breathing, nonlabored ventilation and respiratory function stable Cardiovascular status: blood pressure returned to baseline and stable Postop Assessment: no apparent nausea or vomiting Anesthetic complications: no   No notable events documented.  Last Vitals:  Vitals:   11/17/21 1000 11/17/21 1015  BP: (!) 156/115 (!) 149/103  Pulse: 79 68  Resp: 15 12  Temp:    SpO2: 99% 100%    Last Pain:  Vitals:   11/17/21 0956  TempSrc:   PainSc: 2                  Lynda Rainwater

## 2021-11-17 NOTE — Transfer of Care (Signed)
Immediate Anesthesia Transfer of Care Note  Patient: Jillian Vasquez  Procedure(s) Performed: LEFT BREAST LUMPECTOMY WITH RADIOACTIVE SEED LOCALIZATION (Left: Breast)  Patient Location: PACU  Anesthesia Type:General  Level of Consciousness: drowsy and patient cooperative  Airway & Oxygen Therapy: Patient Spontanous Breathing and Patient connected to face mask oxygen  Post-op Assessment: Report given to RN and Post -op Vital signs reviewed and stable  Post vital signs: Reviewed and stable  Last Vitals:  Vitals Value Taken Time  BP    Temp    Pulse    Resp    SpO2      Last Pain:  Vitals:   11/17/21 0708  TempSrc: Oral  PainSc: 0-No pain         Complications: No notable events documented.

## 2021-11-18 ENCOUNTER — Encounter (HOSPITAL_BASED_OUTPATIENT_CLINIC_OR_DEPARTMENT_OTHER): Payer: Self-pay | Admitting: Surgery

## 2021-11-19 ENCOUNTER — Ambulatory Visit: Payer: BC Managed Care – PPO

## 2021-11-26 ENCOUNTER — Other Ambulatory Visit: Payer: Self-pay

## 2021-11-26 ENCOUNTER — Ambulatory Visit (INDEPENDENT_AMBULATORY_CARE_PROVIDER_SITE_OTHER): Payer: BC Managed Care – PPO | Admitting: *Deleted

## 2021-11-26 DIAGNOSIS — J4540 Moderate persistent asthma, uncomplicated: Secondary | ICD-10-CM

## 2021-12-02 LAB — SURGICAL PATHOLOGY

## 2021-12-03 ENCOUNTER — Encounter: Payer: Self-pay | Admitting: Surgery

## 2021-12-09 DIAGNOSIS — G4733 Obstructive sleep apnea (adult) (pediatric): Secondary | ICD-10-CM | POA: Diagnosis not present

## 2021-12-10 ENCOUNTER — Ambulatory Visit (INDEPENDENT_AMBULATORY_CARE_PROVIDER_SITE_OTHER): Payer: BC Managed Care – PPO

## 2021-12-10 ENCOUNTER — Other Ambulatory Visit: Payer: Self-pay

## 2021-12-10 DIAGNOSIS — J4540 Moderate persistent asthma, uncomplicated: Secondary | ICD-10-CM

## 2021-12-24 ENCOUNTER — Ambulatory Visit (INDEPENDENT_AMBULATORY_CARE_PROVIDER_SITE_OTHER): Payer: BC Managed Care – PPO

## 2021-12-24 ENCOUNTER — Other Ambulatory Visit: Payer: Self-pay

## 2021-12-24 DIAGNOSIS — J4540 Moderate persistent asthma, uncomplicated: Secondary | ICD-10-CM | POA: Diagnosis not present

## 2021-12-29 ENCOUNTER — Encounter (HOSPITAL_COMMUNITY): Payer: Self-pay

## 2022-01-06 ENCOUNTER — Other Ambulatory Visit: Payer: Self-pay

## 2022-01-06 ENCOUNTER — Ambulatory Visit (INDEPENDENT_AMBULATORY_CARE_PROVIDER_SITE_OTHER): Payer: BC Managed Care – PPO

## 2022-01-06 DIAGNOSIS — J4540 Moderate persistent asthma, uncomplicated: Secondary | ICD-10-CM

## 2022-01-06 DIAGNOSIS — G4733 Obstructive sleep apnea (adult) (pediatric): Secondary | ICD-10-CM | POA: Diagnosis not present

## 2022-01-07 ENCOUNTER — Ambulatory Visit: Payer: BC Managed Care – PPO

## 2022-01-17 ENCOUNTER — Encounter: Payer: Self-pay | Admitting: Family Medicine

## 2022-01-17 ENCOUNTER — Other Ambulatory Visit: Payer: Self-pay

## 2022-01-17 ENCOUNTER — Ambulatory Visit: Payer: BC Managed Care – PPO | Admitting: Family Medicine

## 2022-01-17 ENCOUNTER — Telehealth: Payer: Self-pay

## 2022-01-17 VITALS — BP 162/111 | HR 86 | Wt 188.6 lb

## 2022-01-17 DIAGNOSIS — I889 Nonspecific lymphadenitis, unspecified: Secondary | ICD-10-CM | POA: Diagnosis not present

## 2022-01-17 DIAGNOSIS — J4531 Mild persistent asthma with (acute) exacerbation: Secondary | ICD-10-CM

## 2022-01-17 DIAGNOSIS — M7989 Other specified soft tissue disorders: Secondary | ICD-10-CM | POA: Diagnosis not present

## 2022-01-17 DIAGNOSIS — I10 Essential (primary) hypertension: Secondary | ICD-10-CM | POA: Diagnosis not present

## 2022-01-17 MED ORDER — DOXYCYCLINE HYCLATE 100 MG PO TABS: 100.0000 mg | ORAL_TABLET | Freq: Two times a day (BID) | ORAL | 0 refills | Status: DC

## 2022-01-17 MED ORDER — BUDESONIDE-FORMOTEROL FUMARATE 80-4.5 MCG/ACT IN AERO: 2.0000 | INHALATION_SPRAY | Freq: Two times a day (BID) | RESPIRATORY_TRACT | 12 refills | Status: DC

## 2022-01-17 MED ORDER — TRAMADOL HCL 50 MG PO TABS: 50.0000 mg | ORAL_TABLET | Freq: Three times a day (TID) | ORAL | 0 refills | Status: AC | PRN

## 2022-01-17 NOTE — Telephone Encounter (Signed)
Patient calls nurse line reporting painful "knot" under her left armpit.  ? ?Patient reports she had breast surgery in February and was told to call her PCP for any possible complications.  ? ?Patient reports very painful and believes there is some puss underneath. Patient reports possible low grade fever yesterday. Patient denies chest pains or SOB.  ? ?Patient reports she would only like to see PCP.  ? ?Patient scheduled for this morning at 850a. ? ?Patient reports she will be here on time.  ? ? ?

## 2022-01-17 NOTE — Progress Notes (Signed)
? ? ?  SUBJECTIVE:  ? ?CHIEF COMPLAINT: Lump in axilla  ?HPI:  ? ?Jillian Vasquez is a 46 y.o.  with history notable for hypertension and recent lumpectomy (L breast) found to have benign disease (Fibrous tissue)  presenting for axillary lesion.  ? ?The patient reports ongoing axillary swelling and asymmetry since the surgery. Over past  5 days has had pain and worsening swelling. No documented fevers, nausea, vomiting or drainage from area.  No drainage or pus. No + new soaps or deodorants. Work has been stressful lately. Has pain with lifting arm above head, 6/10. No arm or hand swelling. No prior similar episodes. Had lumpectomy in January and had edema after this. Was seen by Dr. Brantley Stage who felt this was due to additional adipose in area. This new area of swelling is painful and located more superior-lateral. Patient is concerned something serious is going on.  ? ?Patient did not take blood pressure medications today due to stress. No chest pain or severe headache.  ? ?PERTINENT  PMH / PSH/Family/Social History : noted and reviewed  ? ?OBJECTIVE:  ? ?BP (!) 162/111   Pulse 86   Wt 188 lb 9.6 oz (85.5 kg)   SpO2 100%   BMI 35.64 kg/m?   ?Today's weight:  ?Last Weight  Most recent update: 01/17/2022  8:58 AM  ? ? Weight  ?85.5 kg (188 lb 9.6 oz)  ?      ? ?  ? ?Review of prior weights: ?Filed Weights  ? 01/17/22 0858  ?Weight: 188 lb 9.6 oz (85.5 kg)  ? ?Cardiac: Warm well perfused.  Capillary refill less than 3 seconds ?Respiratory breathing comfortably on room air ?Psych: Pleasant but sad affect, appropriate, normal rate of speech ?Chaperoned exam ? ?Bilateral breasts are examined.  Appear symmetric.  On the right there is no lymphadenopathy no masses palpated.  Left breast has a palpable surgical clip underneath the incision site.  There are no other abnormalities palpated.  In the left axilla there is some extra tissue in the intertriginous fold and then superior laterally to that there is a  1-1/2 x 2 cm area tender painful nonfluctuant irregular feeling nodule  consistent with lymph node. ? ?ASSESSMENT/PLAN:  ?Lymphadenitis, no fluctuance appreciated makes this less likely an underlying abscess although this could be the start of one. Top differential this time is infectious although given her clinical history will further evaluate with imaging, must also consider malignancy, thought seems less likely given sudden onset, pain.   Recommended she follow-up with Dr. Brantley Stage.  Close follow-up was scheduled in 2 weeks time to reevaluate the area and consider further imaging/intervention if this is not resolved at that time.  Reviewed strict return precautions including fevers.  CBC today ?- Doxycycline for possible infection ?- Tylenol and then Rx Tramadol for pain, #12 tabs, PDMP appropriate, cannot take NSAIDs due to HTN and baseline mild rise in creatinine in fall ? ?Hypertension, not at goal, did not take medications. Follow up in 2 weeks for BP check. No symptoms of emergency. BMP today. ? ? ? ?Dorris Singh, MD  ?Family Medicine Teaching Service  ?[place]  ? ? ?

## 2022-01-17 NOTE — Patient Instructions (Signed)
It was wonderful to see you today. ? ?Please bring ALL of your medications with you to every visit.  ? ?Today we talked about: ? ?--Further evaluating this swelling with an ultrasound and mammogram ? ?-You may have an infection deeper in the armpit---I would like to treat with antibiotics and get more imaging  ? ?-Please use warm compresses for pain  ? ?I sent in Tramadol for the pain since you cannot take NSAIDS ?You can take Tylenol and Tramadol for pain  ? ?Please follow up in 7-10 days for your blood pressure  ? ? ? ? ?Thank you for choosing [facility].  ? ?Please call [phone] with any questions about today's appointment. ? ?Please be sure to schedule follow up at the front  desk before you leave today.  ? ?Dorris Singh, MD  ?Family Medicine  ? ?

## 2022-01-17 NOTE — Addendum Note (Signed)
Addended byOwens Shark, Sher Shampine on: 01/17/2022 03:34 PM ? ? Modules accepted: Orders ? ?

## 2022-01-18 LAB — CBC
Hematocrit: 35.6 % (ref 34.0–46.6)
Hemoglobin: 11.2 g/dL (ref 11.1–15.9)
MCH: 23.8 pg — ABNORMAL LOW (ref 26.6–33.0)
MCHC: 31.5 g/dL (ref 31.5–35.7)
MCV: 76 fL — ABNORMAL LOW (ref 79–97)
Platelets: 311 10*3/uL (ref 150–450)
RBC: 4.7 x10E6/uL (ref 3.77–5.28)
RDW: 14.2 % (ref 11.7–15.4)
WBC: 8.2 10*3/uL (ref 3.4–10.8)

## 2022-01-18 LAB — BASIC METABOLIC PANEL
BUN/Creatinine Ratio: 12 (ref 9–23)
BUN: 13 mg/dL (ref 6–24)
CO2: 22 mmol/L (ref 20–29)
Calcium: 9 mg/dL (ref 8.7–10.2)
Chloride: 102 mmol/L (ref 96–106)
Creatinine, Ser: 1.06 mg/dL — ABNORMAL HIGH (ref 0.57–1.00)
Glucose: 98 mg/dL (ref 70–99)
Potassium: 4.1 mmol/L (ref 3.5–5.2)
Sodium: 136 mmol/L (ref 134–144)
eGFR: 66 mL/min/{1.73_m2} (ref >59)

## 2022-01-20 ENCOUNTER — Ambulatory Visit
Admission: RE | Admit: 2022-01-20 | Discharge: 2022-01-20 | Disposition: A | Discharge status: Home / Self Care | Payer: BC Managed Care – PPO | Source: Ambulatory Visit | Attending: Family Medicine | Admitting: Family Medicine

## 2022-01-20 ENCOUNTER — Telehealth: Payer: Self-pay | Admitting: Family Medicine

## 2022-01-20 ENCOUNTER — Other Ambulatory Visit: Payer: Self-pay | Admitting: Family Medicine

## 2022-01-20 ENCOUNTER — Ambulatory Visit (INDEPENDENT_AMBULATORY_CARE_PROVIDER_SITE_OTHER): Payer: BC Managed Care – PPO

## 2022-01-20 DIAGNOSIS — R928 Other abnormal and inconclusive findings on diagnostic imaging of breast: Secondary | ICD-10-CM | POA: Diagnosis not present

## 2022-01-20 DIAGNOSIS — M7989 Other specified soft tissue disorders: Secondary | ICD-10-CM

## 2022-01-20 DIAGNOSIS — J4540 Moderate persistent asthma, uncomplicated: Secondary | ICD-10-CM | POA: Diagnosis not present

## 2022-01-20 DIAGNOSIS — N6489 Other specified disorders of breast: Secondary | ICD-10-CM | POA: Diagnosis not present

## 2022-01-20 NOTE — Telephone Encounter (Signed)
Called with results.   Aubree Doody, MD  Family Medicine Teaching Service   

## 2022-02-04 ENCOUNTER — Ambulatory Visit (INDEPENDENT_AMBULATORY_CARE_PROVIDER_SITE_OTHER): Payer: BC Managed Care – PPO

## 2022-02-04 DIAGNOSIS — J4540 Moderate persistent asthma, uncomplicated: Secondary | ICD-10-CM | POA: Diagnosis not present

## 2022-02-18 ENCOUNTER — Ambulatory Visit (INDEPENDENT_AMBULATORY_CARE_PROVIDER_SITE_OTHER): Payer: BC Managed Care – PPO | Admitting: *Deleted

## 2022-02-18 DIAGNOSIS — J4540 Moderate persistent asthma, uncomplicated: Secondary | ICD-10-CM

## 2022-03-04 ENCOUNTER — Ambulatory Visit: Payer: BC Managed Care – PPO

## 2022-03-14 ENCOUNTER — Ambulatory Visit (INDEPENDENT_AMBULATORY_CARE_PROVIDER_SITE_OTHER): Payer: BC Managed Care – PPO

## 2022-03-14 DIAGNOSIS — J4540 Moderate persistent asthma, uncomplicated: Secondary | ICD-10-CM | POA: Diagnosis not present

## 2022-03-28 ENCOUNTER — Ambulatory Visit: Payer: BC Managed Care – PPO

## 2022-03-29 ENCOUNTER — Encounter: Payer: Self-pay | Admitting: *Deleted

## 2022-04-08 ENCOUNTER — Other Ambulatory Visit: Payer: Self-pay

## 2022-04-08 MED ORDER — LOSARTAN POTASSIUM 50 MG PO TABS: 50.0000 mg | ORAL_TABLET | Freq: Every day | ORAL | 3 refills | Status: DC

## 2022-04-15 ENCOUNTER — Other Ambulatory Visit: Payer: Self-pay

## 2022-04-15 ENCOUNTER — Encounter (HOSPITAL_BASED_OUTPATIENT_CLINIC_OR_DEPARTMENT_OTHER): Payer: Self-pay | Admitting: *Deleted

## 2022-04-15 ENCOUNTER — Emergency Department (HOSPITAL_BASED_OUTPATIENT_CLINIC_OR_DEPARTMENT_OTHER): Payer: BC Managed Care – PPO | Admitting: Radiology

## 2022-04-15 ENCOUNTER — Ambulatory Visit (INDEPENDENT_AMBULATORY_CARE_PROVIDER_SITE_OTHER): Payer: BC Managed Care – PPO

## 2022-04-15 ENCOUNTER — Emergency Department (HOSPITAL_BASED_OUTPATIENT_CLINIC_OR_DEPARTMENT_OTHER): Payer: BC Managed Care – PPO

## 2022-04-15 ENCOUNTER — Emergency Department (HOSPITAL_BASED_OUTPATIENT_CLINIC_OR_DEPARTMENT_OTHER)
Admission: EM | Admit: 2022-04-15 | Discharge: 2022-04-15 | Disposition: A | Discharge status: Home / Self Care | Payer: BC Managed Care – PPO | Attending: Emergency Medicine | Admitting: Emergency Medicine

## 2022-04-15 DIAGNOSIS — J4540 Moderate persistent asthma, uncomplicated: Secondary | ICD-10-CM

## 2022-04-15 DIAGNOSIS — R079 Chest pain, unspecified: Secondary | ICD-10-CM | POA: Diagnosis not present

## 2022-04-15 DIAGNOSIS — R109 Unspecified abdominal pain: Secondary | ICD-10-CM | POA: Diagnosis not present

## 2022-04-15 DIAGNOSIS — R0789 Other chest pain: Secondary | ICD-10-CM | POA: Insufficient documentation

## 2022-04-15 DIAGNOSIS — I10 Essential (primary) hypertension: Secondary | ICD-10-CM | POA: Insufficient documentation

## 2022-04-15 DIAGNOSIS — Z79899 Other long term (current) drug therapy: Secondary | ICD-10-CM | POA: Insufficient documentation

## 2022-04-15 LAB — CBC
HCT: 33.7 % — ABNORMAL LOW (ref 36.0–46.0)
Hemoglobin: 9.9 g/dL — ABNORMAL LOW (ref 12.0–15.0)
MCH: 20.8 pg — ABNORMAL LOW (ref 26.0–34.0)
MCHC: 29.4 g/dL — ABNORMAL LOW (ref 30.0–36.0)
MCV: 70.8 fL — ABNORMAL LOW (ref 80.0–100.0)
Platelets: 286 10*3/uL (ref 150–400)
RBC: 4.76 MIL/uL (ref 3.87–5.11)
RDW: 17.6 % — ABNORMAL HIGH (ref 11.5–15.5)
WBC: 7.5 10*3/uL (ref 4.0–10.5)
nRBC: 0 % (ref 0.0–0.2)

## 2022-04-15 LAB — BASIC METABOLIC PANEL
Anion gap: 8 (ref 5–15)
BUN: 11 mg/dL (ref 6–20)
CO2: 24 mmol/L (ref 22–32)
Calcium: 8.9 mg/dL (ref 8.9–10.3)
Chloride: 103 mmol/L (ref 98–111)
Creatinine, Ser: 1.11 mg/dL — ABNORMAL HIGH (ref 0.44–1.00)
GFR, Estimated: >60 mL/min (ref >60)
Glucose, Bld: 110 mg/dL — ABNORMAL HIGH (ref 70–99)
Potassium: 3.5 mmol/L (ref 3.5–5.1)
Sodium: 135 mmol/L (ref 135–145)

## 2022-04-15 LAB — TROPONIN I (HIGH SENSITIVITY)
Troponin I (High Sensitivity): 7 ng/L (ref <18)
Troponin I (High Sensitivity): 8 ng/L (ref <18)

## 2022-04-15 MED ORDER — IOHEXOL 350 MG/ML SOLN
100.0000 mL | Freq: Once | INTRAVENOUS | Status: AC | PRN
  Administered 2022-04-15: 100 mL via INTRAVENOUS

## 2022-04-15 MED ORDER — ASPIRIN 81 MG PO CHEW
324.0000 mg | CHEWABLE_TABLET | Freq: Once | ORAL | Status: AC
  Administered 2022-04-15: 324 mg via ORAL
  Filled 2022-04-15: qty 4

## 2022-04-15 NOTE — ED Notes (Signed)
Patient transported to CT 

## 2022-04-15 NOTE — ED Triage Notes (Signed)
Pt reports that her BP has been elevated since Wednesday.  Pt is on Amlodipine 10mg  daily and Losartan 50mg  daily and has been taking them as prescribed.  Pt reports being under stress.  Pt also reports feeling a "pop" in her chest about ago and she reports it felt like "a rubber band was popped" and she now feels like someone "is poking" her in her chest. No sob with this.

## 2022-04-29 ENCOUNTER — Ambulatory Visit (INDEPENDENT_AMBULATORY_CARE_PROVIDER_SITE_OTHER): Payer: BC Managed Care – PPO

## 2022-04-29 DIAGNOSIS — J4540 Moderate persistent asthma, uncomplicated: Secondary | ICD-10-CM | POA: Diagnosis not present

## 2022-05-13 ENCOUNTER — Ambulatory Visit: Payer: BC Managed Care – PPO

## 2022-05-16 ENCOUNTER — Ambulatory Visit: Payer: BC Managed Care – PPO

## 2022-05-30 ENCOUNTER — Telehealth: Payer: Self-pay

## 2022-05-30 NOTE — Telephone Encounter (Signed)
Patient calls nurse line reporting bilateral LE swelling.   Patient reports symptoms started ~ 2 weeks. Patient denies any pain, chest pains or SOB. Patient reports she has been elevating her legs everyday and this helps.   Patient reports blood pressure today was 150/70. Patient has been compliant with all medications.   Patient scheduled for 8/14.  Patient advised to continue to elevate legs and check home blood pressures.   Strict ED precautions given to patient.

## 2022-06-06 ENCOUNTER — Ambulatory Visit (INDEPENDENT_AMBULATORY_CARE_PROVIDER_SITE_OTHER): Payer: BC Managed Care – PPO | Admitting: Student

## 2022-06-06 ENCOUNTER — Encounter: Payer: Self-pay | Admitting: Student

## 2022-06-06 VITALS — BP 128/78 | HR 78 | Ht 61.0 in | Wt 193.0 lb

## 2022-06-06 DIAGNOSIS — R7989 Other specified abnormal findings of blood chemistry: Secondary | ICD-10-CM

## 2022-06-06 DIAGNOSIS — M7989 Other specified soft tissue disorders: Secondary | ICD-10-CM | POA: Insufficient documentation

## 2022-06-06 DIAGNOSIS — D509 Iron deficiency anemia, unspecified: Secondary | ICD-10-CM

## 2022-06-06 DIAGNOSIS — I10 Essential (primary) hypertension: Secondary | ICD-10-CM

## 2022-06-06 DIAGNOSIS — N939 Abnormal uterine and vaginal bleeding, unspecified: Secondary | ICD-10-CM

## 2022-06-06 NOTE — Assessment & Plan Note (Signed)
Referral to OB/GYN placed

## 2022-06-06 NOTE — Assessment & Plan Note (Signed)
Well-controlled today Continue losartan and amlodipine

## 2022-06-06 NOTE — Assessment & Plan Note (Signed)
Less likely CHF given intermittent nature and spontaneous resolution Likely multifactorial-namely anemia, obesity, extended periods of immobility No red flags for acute DVT We will obtain labs today- BNP, CBC, CMP Encourage patient to continue taking her iron supplements

## 2022-06-06 NOTE — Progress Notes (Signed)
    SUBJECTIVE:   CHIEF COMPLAINT / HPI:   Lower extremity edema Patient says she went to [place] on July 18 and developed ankle and knee swelling nearly every day but improved with elevation of legs She then developed swelling again a few days later while taking a car trip to [place] Most recently, her leg swelled on 8/12 to the mid shin No associated shortness of breath, chest pain No history of CHF  Hypertension Endorses taking amlodipine and losartan as prescribed with blood pressures in normal range Says she has had hypertension since she was 46 years old  Abnormal uterine bleeding Desires a repeat referral to OB/GYN if she is not able to have an appointment with them to discuss AUB and possibility of hysterectomy versus ablation  PERTINENT  PMH / PSH: Reviewed  OBJECTIVE:   BP 128/78   Pulse 78   Ht '5\' 1"'$  (1.549 m)   Wt 193 lb (87.5 kg)   LMP 05/07/2022 (Exact Date)   SpO2 98%   BMI 36.47 kg/m   General: Well-appearing, sitting in chair in no acute distress, pleasant CV: Regular rate and rhythm without murmurs Respiratory: Normal work of breathing on room air without wheezing or crackles  Extremity: Trace edema in bilateral lower extremities, although no overt acute edema.  Distal pulses palpable.  No calf tenderness to palpation  ASSESSMENT/PLAN:   Swelling of lower extremity Less likely CHF given intermittent nature and spontaneous resolution Likely multifactorial-namely anemia, obesity, extended periods of immobility No red flags for acute DVT We will obtain labs today- BNP, CBC, CMP Encourage patient to continue taking her iron supplements  Essential hypertension Well-controlled today Continue losartan and amlodipine  Abnormal uterine bleeding Referral to OB/GYN placed     Orvis Brill, [place]

## 2022-06-06 NOTE — Patient Instructions (Addendum)
It was great seeing you today.  I believe your swelling in your legs could be due to many reasons, particularly anemia (low blood counts), weight gain and/or other possible causes.  We are checking some labs today. I will call you with the results if they are abnormal.  I re-sent your referral to the OBGYN.  We will plan to see you again next year unless you have any other concerns.   If you have any questions or concerns, please feel free to call the clinic.    Be well,  Dr. Orvis Brill [facility] [phone]

## 2022-06-08 LAB — CBC
Hematocrit: 30.6 % — ABNORMAL LOW (ref 34.0–46.6)
Hemoglobin: 8.8 g/dL — ABNORMAL LOW (ref 11.1–15.9)
MCH: 20 pg — ABNORMAL LOW (ref 26.6–33.0)
MCHC: 28.8 g/dL — ABNORMAL LOW (ref 31.5–35.7)
MCV: 70 fL — ABNORMAL LOW (ref 79–97)
Platelets: 300 10*3/uL (ref 150–450)
RBC: 4.39 x10E6/uL (ref 3.77–5.28)
RDW: 17.1 % — ABNORMAL HIGH (ref 11.7–15.4)
WBC: 6.9 10*3/uL (ref 3.4–10.8)

## 2022-06-08 LAB — BASIC METABOLIC PANEL
BUN/Creatinine Ratio: 10 (ref 9–23)
BUN: 12 mg/dL (ref 6–24)
CO2: 20 mmol/L (ref 20–29)
Calcium: 8.9 mg/dL (ref 8.7–10.2)
Chloride: 104 mmol/L (ref 96–106)
Creatinine, Ser: 1.22 mg/dL — ABNORMAL HIGH (ref 0.57–1.00)
Glucose: 88 mg/dL (ref 70–99)
Potassium: 4.1 mmol/L (ref 3.5–5.2)
Sodium: 138 mmol/L (ref 134–144)
eGFR: 56 mL/min/{1.73_m2} — ABNORMAL LOW (ref >59)

## 2022-06-08 LAB — BRAIN NATRIURETIC PEPTIDE: BNP: 8.8 pg/mL (ref 0.0–100.0)

## 2022-06-30 ENCOUNTER — Ambulatory Visit (INDEPENDENT_AMBULATORY_CARE_PROVIDER_SITE_OTHER): Payer: Commercial Managed Care - HMO

## 2022-06-30 DIAGNOSIS — J4540 Moderate persistent asthma, uncomplicated: Secondary | ICD-10-CM

## 2022-06-30 MED ORDER — DUPILUMAB 300 MG/2ML ~~LOC~~ SOSY
600.0000 mg | PREFILLED_SYRINGE | Freq: Once | SUBCUTANEOUS | Status: AC
Start: 2022-06-30 — End: 2022-06-30
  Administered 2022-06-30: 600 mg via SUBCUTANEOUS

## 2022-07-14 ENCOUNTER — Ambulatory Visit: Payer: Self-pay

## 2022-07-15 ENCOUNTER — Ambulatory Visit: Payer: Self-pay

## 2022-07-27 ENCOUNTER — Ambulatory Visit (INDEPENDENT_AMBULATORY_CARE_PROVIDER_SITE_OTHER): Payer: Commercial Managed Care - HMO

## 2022-07-27 DIAGNOSIS — J4540 Moderate persistent asthma, uncomplicated: Secondary | ICD-10-CM | POA: Diagnosis not present

## 2022-08-11 ENCOUNTER — Ambulatory Visit (INDEPENDENT_AMBULATORY_CARE_PROVIDER_SITE_OTHER): Payer: Commercial Managed Care - HMO | Admitting: *Deleted

## 2022-08-11 DIAGNOSIS — J4540 Moderate persistent asthma, uncomplicated: Secondary | ICD-10-CM

## 2022-08-16 NOTE — Patient Instructions (Incomplete)
Asthma Continue albuterol 2 puffs once every 4 hours as needed for cough or wheeze You may use albuterol 2 puffs 5 to 10 minutes before activity to decrease cough or wheeze For asthma flare, begin Symbicort 160-2 puffs twice a day with a spacer for 2 weeks or until cough and wheeze free Continue Dupixent 300 mg every other week  Allergic rhinitis Continue allergen avoidance measures directed toward grass, mold, and cat as listed below Continue an antihistamine once a day as needed for runny nose or itch Continue Flonase 2 sprays in each nostril once a day as needed for a stuffy nose Consider saline nasal rinses as needed for nasal symptoms. Use this before any medicated nasal sprays for best result  Nasal polyposis Continue Flonase 2 sprays in each nostril to control nasal polyps Continue Dupixent 300 mg once every other week  Call the clinic if this treatment plan is not working well for you.  Follow up in 6 months or sooner if needed.  Reducing Pollen Exposure The American Academy of Allergy, Asthma and Immunology suggests the following steps to reduce your exposure to pollen during allergy seasons. Do not hang sheets or clothing out to dry; pollen may collect on these items. Do not mow lawns or spend time around freshly cut grass; mowing stirs up pollen. Keep windows closed at night.  Keep car windows closed while driving. Minimize morning activities outdoors, a time when pollen counts are usually at their highest. Stay indoors as much as possible when pollen counts or humidity is high and on windy days when pollen tends to remain in the air longer. Use air conditioning when possible.  Many air conditioners have filters that trap the pollen spores. Use a HEPA room air filter to remove pollen form the indoor air you breathe.  Control of Mold Allergen Mold and fungi can grow on a variety of surfaces provided certain temperature and moisture conditions exist.  Outdoor molds grow on  plants, decaying vegetation and soil.  The major outdoor mold, Alternaria and Cladosporium, are found in very high numbers during hot and dry conditions.  Generally, a late Summer - Fall peak is seen for common outdoor fungal spores.  Rain will temporarily lower outdoor mold spore count, but counts rise rapidly when the rainy period ends.  The most important indoor molds are Aspergillus and Penicillium.  Dark, humid and poorly ventilated basements are ideal sites for mold growth.  The next most common sites of mold growth are the bathroom and the kitchen.  Outdoor [employer] Use air conditioning and keep windows closed Avoid exposure to decaying vegetation. Avoid leaf raking. Avoid grain handling. Consider wearing a face mask if working in moldy areas.  Indoor Mold Control Maintain humidity below 50%. Clean washable surfaces with 5% bleach solution. Remove sources e.g. Contaminated carpets.  Control of Dog or Cat Allergen Avoidance is the best way to manage a dog or cat allergy. If you have a dog or cat and are allergic to dog or cats, consider removing the dog or cat from the home. If you have a dog or cat but don't want to find it a new home, or if your family wants a pet even though someone in the household is allergic, here are some strategies that may help keep symptoms at bay:  Keep the pet out of your bedroom and restrict it to only a few rooms. Be advised that keeping the dog or cat in only one room will not limit the  allergens to that room. Don't pet, hug or kiss the dog or cat; if you do, wash your hands with soap and water. High-efficiency particulate air (HEPA) cleaners run continuously in a bedroom or living room can reduce allergen levels over time. Regular use of a high-efficiency vacuum cleaner or a central vacuum can reduce allergen levels. Giving your dog or cat a bath at least once a week can reduce airborne allergen.

## 2022-08-16 NOTE — Progress Notes (Signed)
   [place] [address] Dept: [phone]  FOLLOW UP NOTE  Patient ID: Jillian Vasquez, female    DOB: 09/23/1976  Age: 46 y.o. MRN: [id number] Date of Office Visit: 08/17/2022  Assessment  Chief Complaint: No chief complaint on file.  HPI Jillian Vasquez is a 46 year old female who presents to the clinic for follow-up visit.  She was last seen in this clinic on 09/09/2021 by Dr. Ernst Bowler for evaluation of asthma, allergic rhinitis, and nasal polyposis. Her last environmental allergy testing was on 06/25/2021 and was positive to grass pollen, mold, and cat.   Drug Allergies:  Allergies  Allergen Reactions  . Lisinopril Cough    Physical Exam: There were no vitals taken for this visit.   Physical Exam  Diagnostics:    Assessment and Plan: No diagnosis found.  No orders of the defined types were placed in this encounter.   There are no Patient Instructions on file for this visit.  No follow-ups on file.    Thank you for the opportunity to care for this patient.  Please do not hesitate to contact me with questions.  Gareth Morgan, FNP [facility]

## 2022-08-17 ENCOUNTER — Encounter: Payer: Self-pay | Admitting: Family Medicine

## 2022-08-17 ENCOUNTER — Ambulatory Visit: Payer: Commercial Managed Care - HMO | Admitting: Family Medicine

## 2022-08-17 VITALS — BP 146/90 | HR 84 | Temp 98.0°F | Resp 18 | Ht 61.0 in | Wt 190.2 lb

## 2022-08-17 DIAGNOSIS — J3089 Other allergic rhinitis: Secondary | ICD-10-CM

## 2022-08-17 DIAGNOSIS — J339 Nasal polyp, unspecified: Secondary | ICD-10-CM | POA: Diagnosis not present

## 2022-08-17 DIAGNOSIS — J4520 Mild intermittent asthma, uncomplicated: Secondary | ICD-10-CM

## 2022-08-17 DIAGNOSIS — J302 Other seasonal allergic rhinitis: Secondary | ICD-10-CM | POA: Insufficient documentation

## 2022-08-17 DIAGNOSIS — J4540 Moderate persistent asthma, uncomplicated: Secondary | ICD-10-CM | POA: Diagnosis not present

## 2022-08-17 MED ORDER — ALBUTEROL SULFATE HFA 108 (90 BASE) MCG/ACT IN AERS: 2.0000 | INHALATION_SPRAY | RESPIRATORY_TRACT | 2 refills | Status: DC | PRN

## 2022-08-23 ENCOUNTER — Telehealth: Payer: Self-pay

## 2022-08-23 NOTE — Patient Outreach (Signed)
  Care Coordination   71/83/6725 Name: Jillian Vasquez MRN: [id number] DOB: [birth year]   Care Coordination Outreach Attempts:  An unsuccessful telephone outreach was attempted today to offer the patient information about available care coordination services as a benefit of their health plan.   Follow Up Plan:  Additional outreach attempts will be made to offer the patient care coordination information and services.   Encounter Outcome:  No Answer  Care Coordination Interventions Activated:  No   Care Coordination Interventions:  No, not indicated    Jone Baseman, RN, MSN [facility] Care Management Care Management Coordinator Direct Line [phone]

## 2022-08-24 ENCOUNTER — Telehealth: Payer: Self-pay | Admitting: *Deleted

## 2022-08-24 NOTE — Telephone Encounter (Signed)
L/m for patient to contact me to advise approval with new Ins and submit to [facility] now

## 2022-08-31 NOTE — Telephone Encounter (Signed)
L/m for patient to contact me again ?

## 2022-09-12 ENCOUNTER — Telehealth: Payer: Self-pay

## 2022-09-12 NOTE — Patient Outreach (Signed)
  Care Coordination   78/24/2353 Name: Jillian Vasquez MRN: [id number] DOB: 03/10/1976   Care Coordination Outreach Attempts:  A second unsuccessful outreach was attempted today to offer the patient with information about available care coordination services as a benefit of their health plan.     Follow Up Plan:  Additional outreach attempts will be made to offer the patient care coordination information and services.   Encounter Outcome:  No Answer  Care Coordination Interventions Activated:  No   Care Coordination Interventions:  No, not indicated    Jone Baseman, RN, MSN [facility] Care Management Care Management Coordinator Direct Line [phone]

## 2022-09-13 ENCOUNTER — Encounter: Payer: Self-pay | Admitting: *Deleted

## 2022-09-13 NOTE — Telephone Encounter (Signed)
L/m for patient again and sent mychart message to contact me

## 2022-09-17 IMAGING — MG MM DIGITAL DIAGNOSTIC UNILAT*L* W/ TOMO W/ CAD
6 of 12 series · 6 of 36 positions shown · non-contrast
Comparison: Previous exams.

CLINICAL DATA: 44-year-old female with a palpable area of concern
in the left axilla.

EXAM:
DIGITAL DIAGNOSTIC UNILATERAL LEFT MAMMOGRAM WITH TOMO AND CAD;
ULTRASOUND LEFT BREAST LIMITED

[L CC synth-2D (1 of 3)]
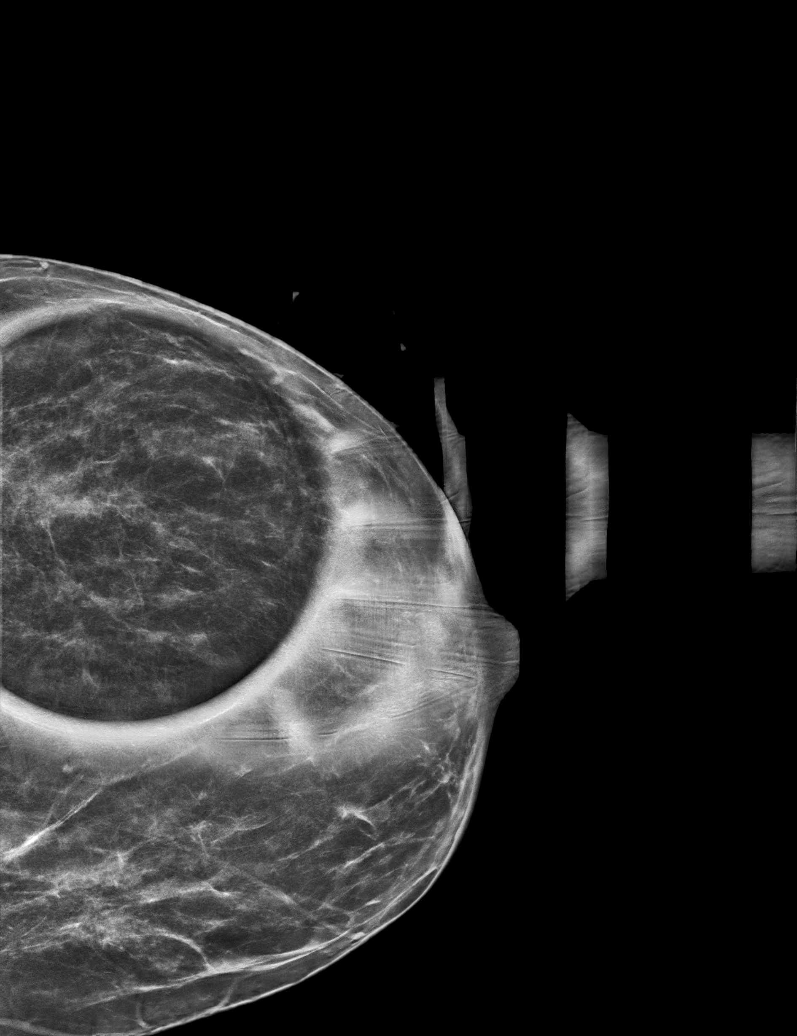

[L CC synth-2D (2 of 3)]
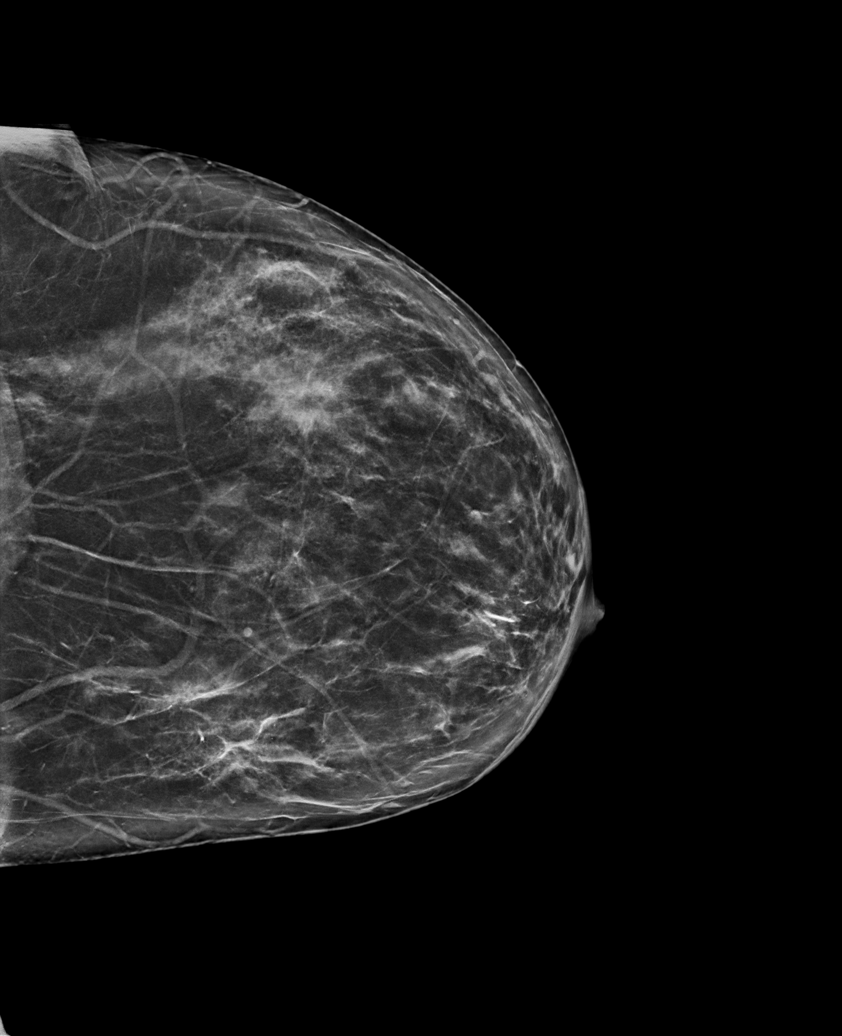

[L CC synth-2D (3 of 3)]
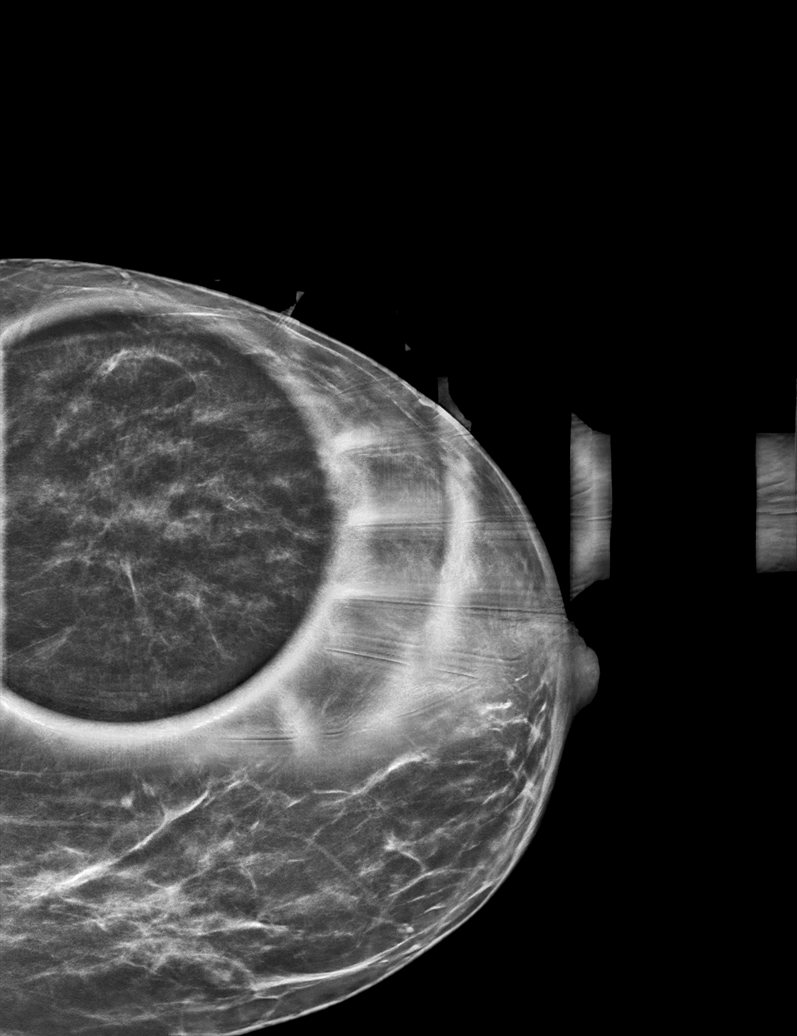

[L MLO synth-2D (1 of 2)]
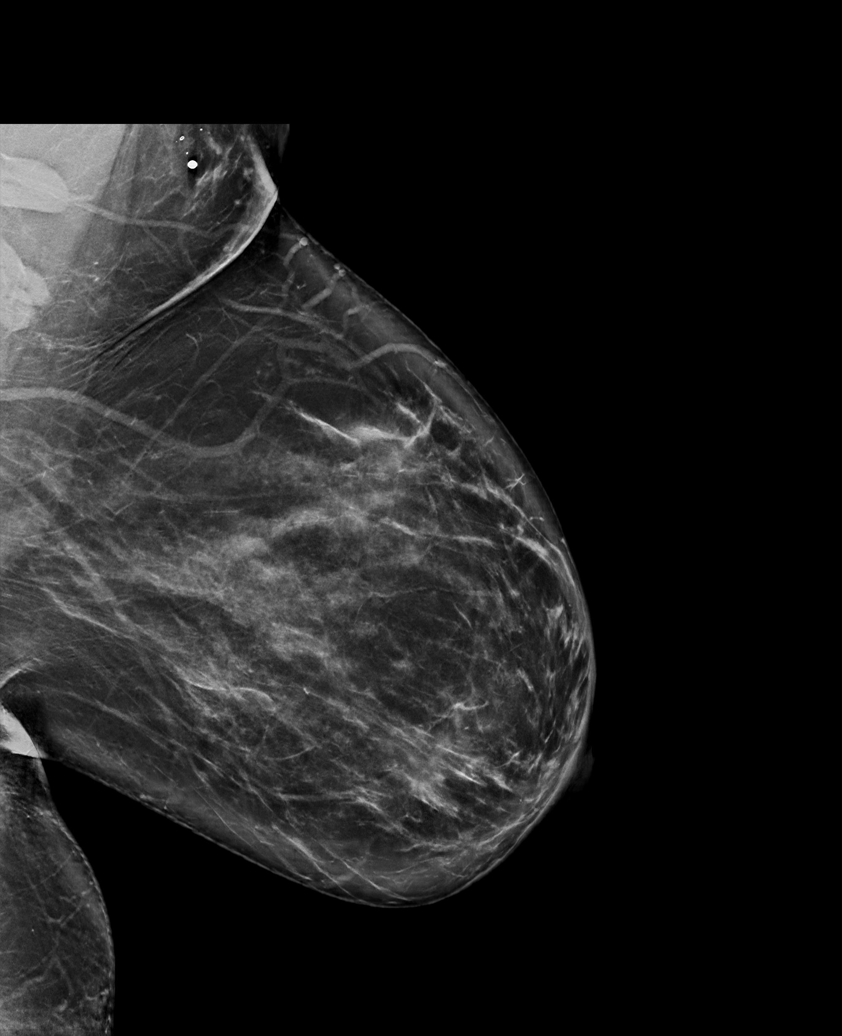

[L MLO synth-2D (2 of 2)]
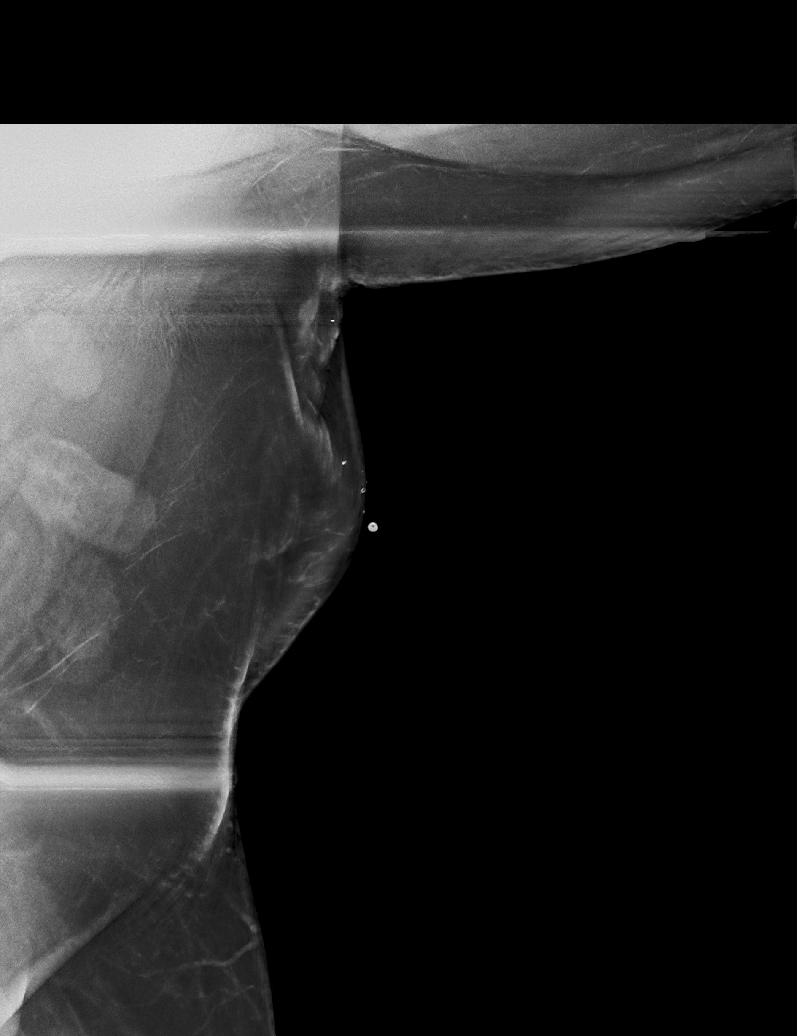

[L ML synth-2D]
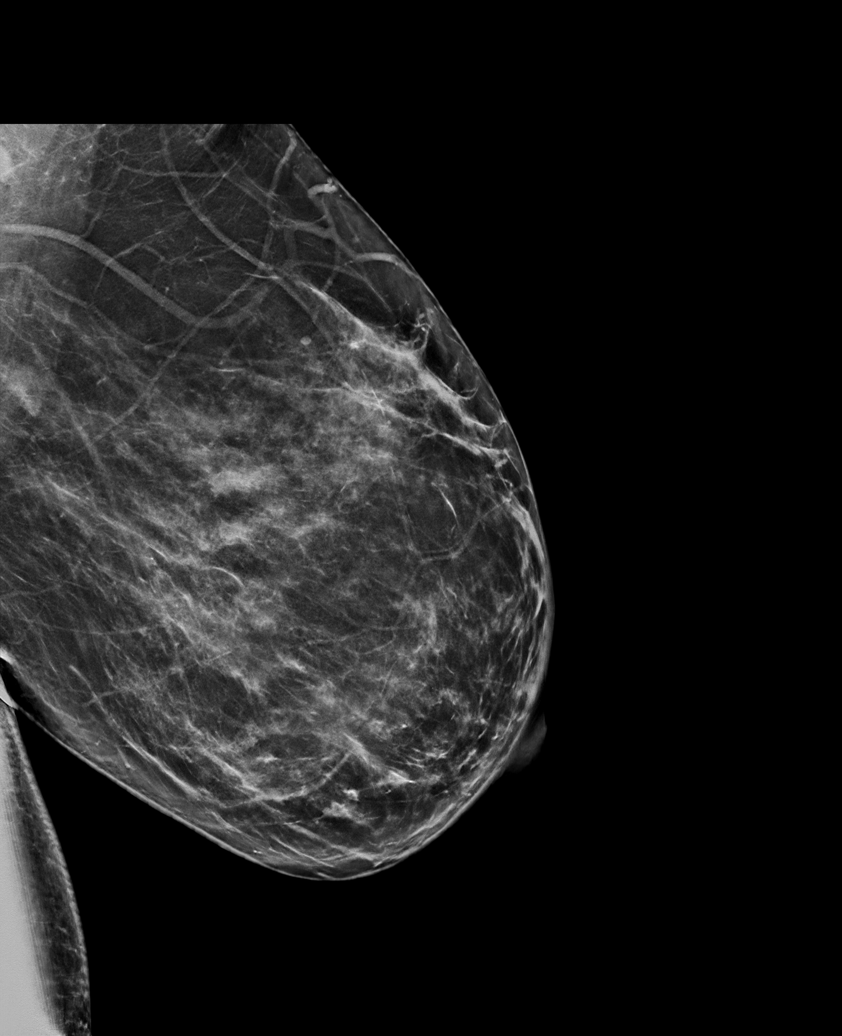

[6 of 36 positions shown; findings below may reference images not displayed]

ACR Breast Density Category c: The breast tissue is heterogeneously
dense, which may obscure small masses.
FINDINGS: There is an area of possible subtle distortion in the upper outer
left breast best visualized on the CC tomograms and felt to persist
on the spot compression images. Spot compression tangential
tomograms were performed over the palpable area of concern in the
left axilla demonstrating accessory fibroglandular tissue as well as
several partially visualized axillary lymph nodes which appear to
contain central lucency.

Mammographic images were processed with CAD.

Physical examination of the left axilla reveals generalized
fullness. Targeted ultrasound of the left axilla was performed
demonstrating fibroglandular tissue compatible with accessory breast
tissue. The lymph nodes are all of normal morphology, with no
abnormal axillary lymph nodes identified. Targeted ultrasound of the
upper-outer left breast was performed with no suspicious masses or
abnormality seen. No sonographic correlate for the possible
distortion seen in the upper-outer left breast at mammography.
IMPRESSION: 1. Subtle/possible area of distortion in the outer left breast best
visualized on the CC view.

2. Accessory breast tissue at site of palpable concern in the left
axilla. No axillary lymphadenopathy identified.

RECOMMENDATION:
Recommend stereotactic guided biopsy of the subtle/possible
distortion in the upper-outer left breast. It has been explained to
the patient that this is a subtle/questionable finding and if this
cannot be adequately reproduced when she returns on the day of
biopsy then short-term follow-up would be recommended.

I have discussed the findings and recommendations with the patient.
If applicable, a reminder letter will be sent to the patient
regarding the next appointment.

BI-RADS CATEGORY  4: Suspicious.

## 2022-09-17 IMAGING — US US BREAST*L* LIMITED INC AXILLA
1 series · 13 of 14 positions shown · non-contrast
Comparison: Previous exams.

CLINICAL DATA: 44-year-old female with a palpable area of concern
in the left axilla.

EXAM:
DIGITAL DIAGNOSTIC UNILATERAL LEFT MAMMOGRAM WITH TOMO AND CAD;
ULTRASOUND LEFT BREAST LIMITED

[Series 1: us breast*left* limited inc axilla · 0.07mm/px · 13 of 14 slices shown]
[im 1/14]
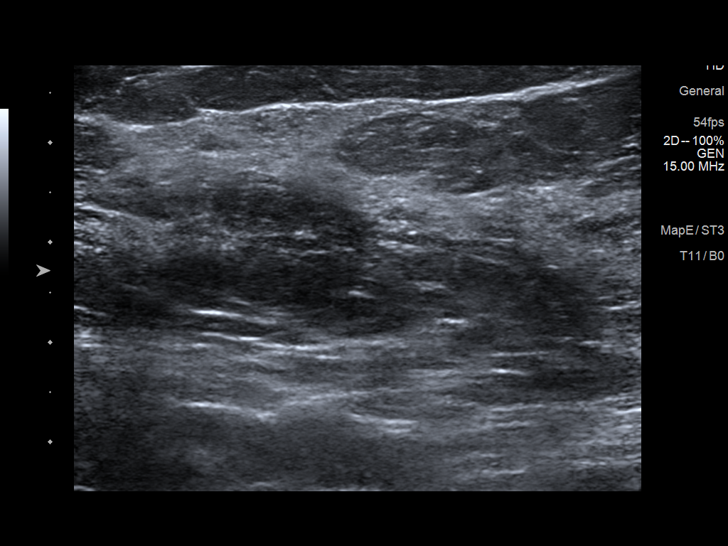
[im 2/14]
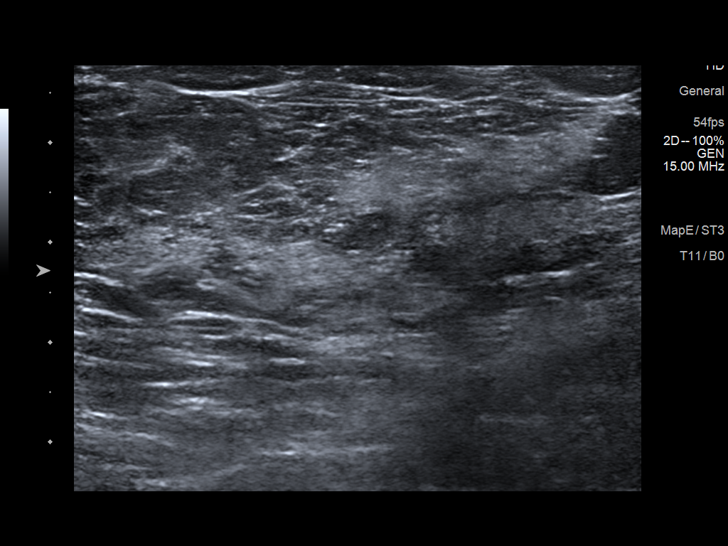
[im 3/14]
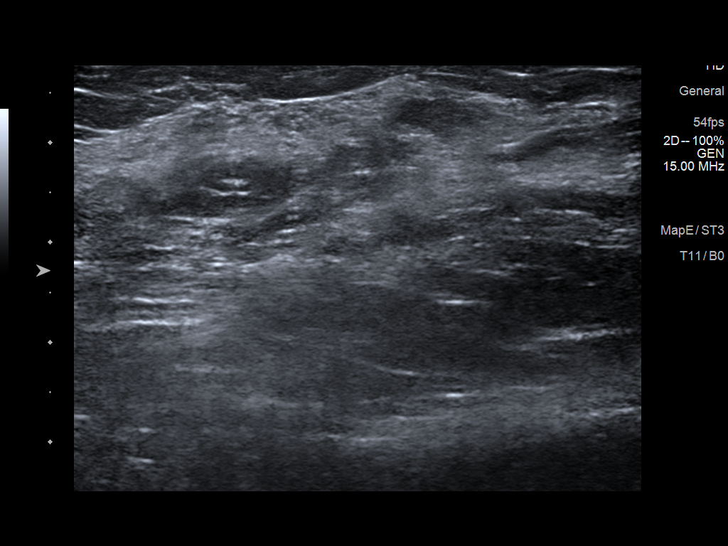
[im 4/14]
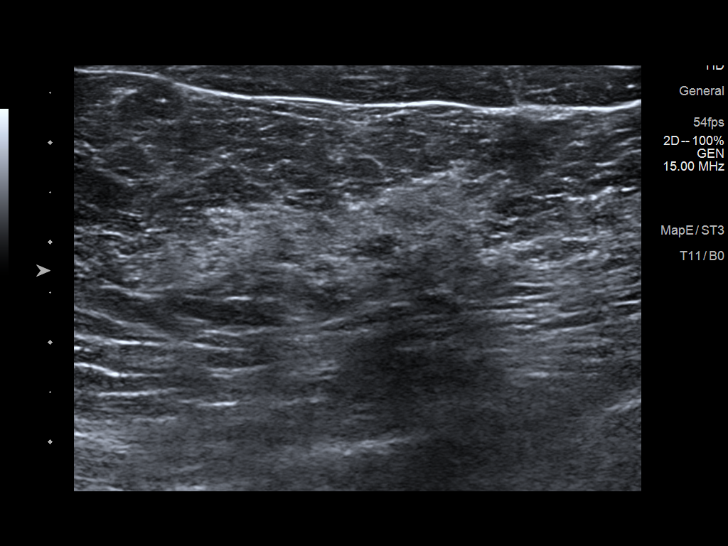
[im 5/14]
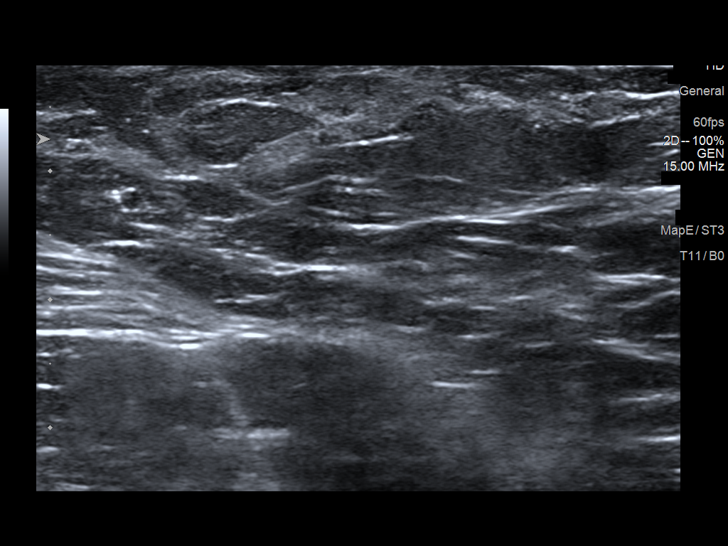
[im 6/14]
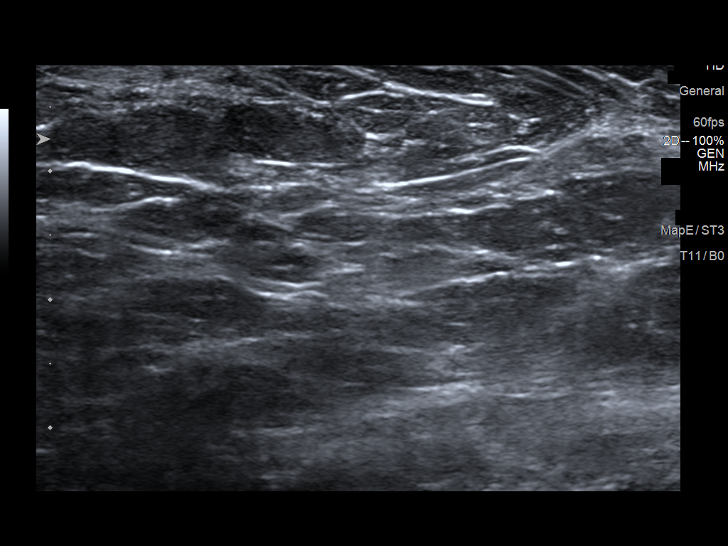
[im 8/14]
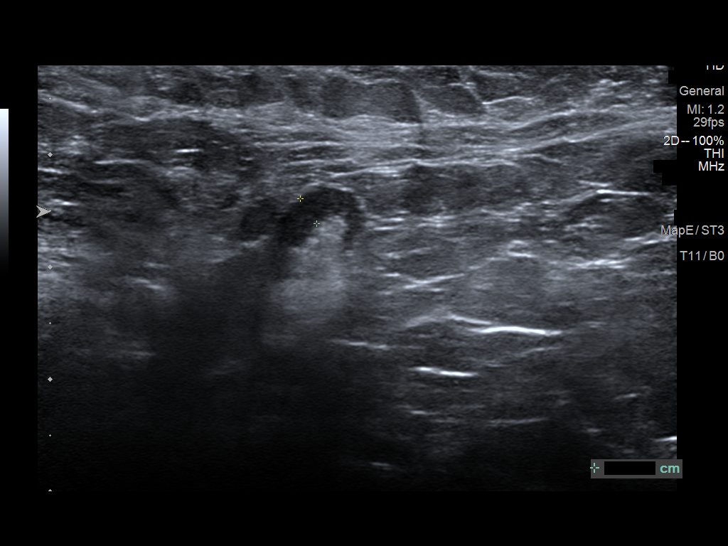
[im 9/14]
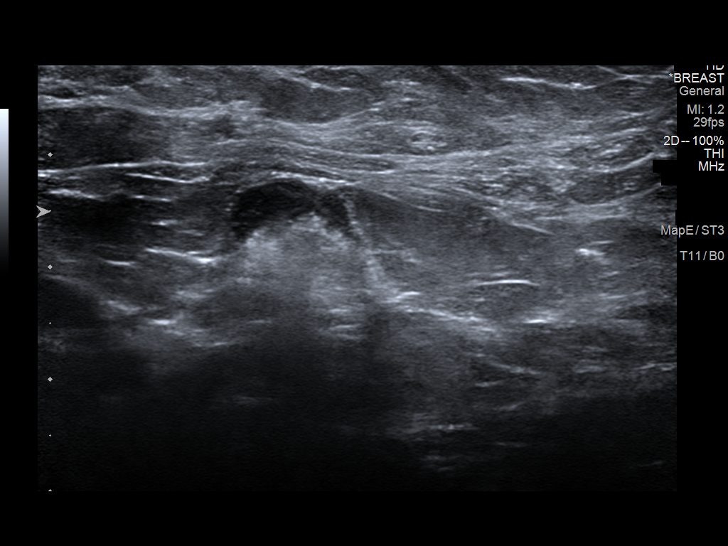
[im 10/14]
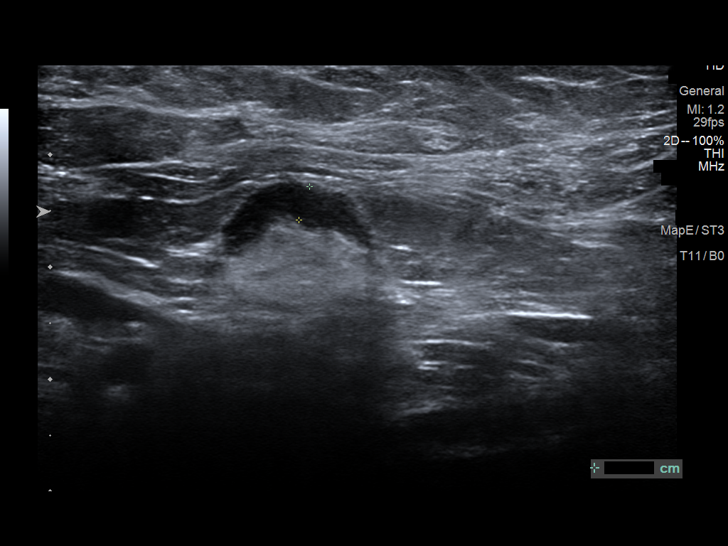
[im 11/14]
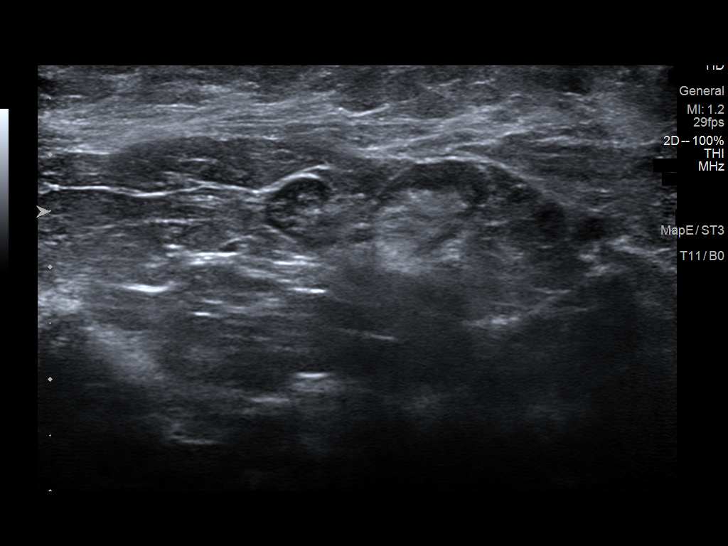
[im 12/14]
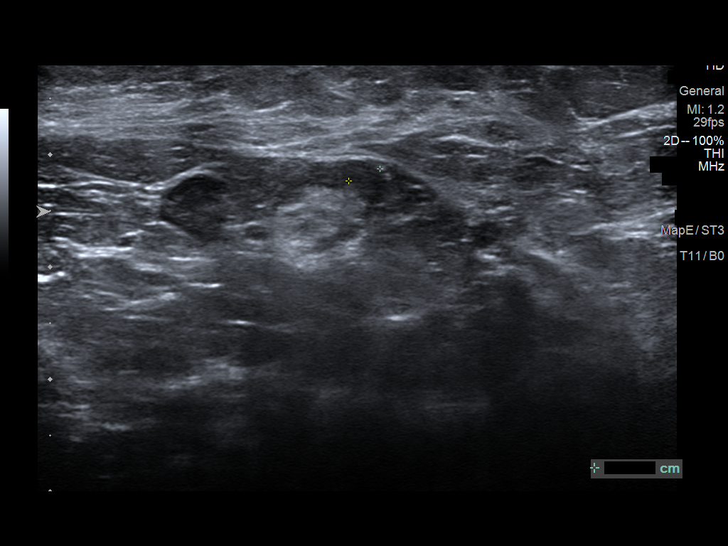
[im 13/14]
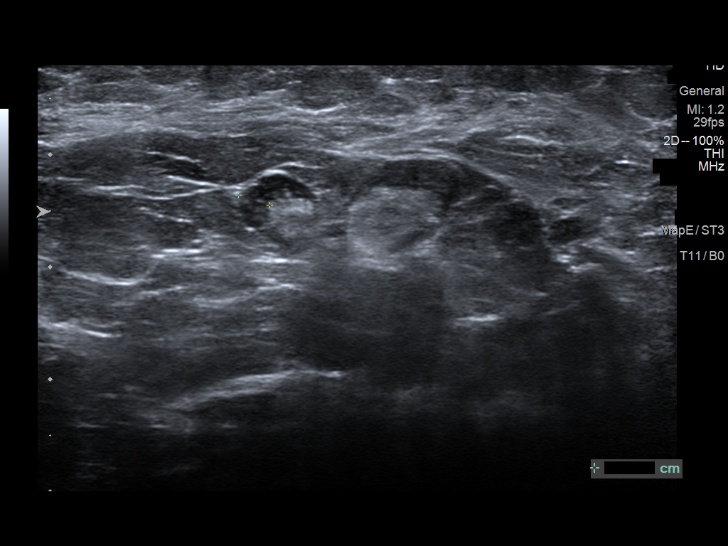
[im 14/14]
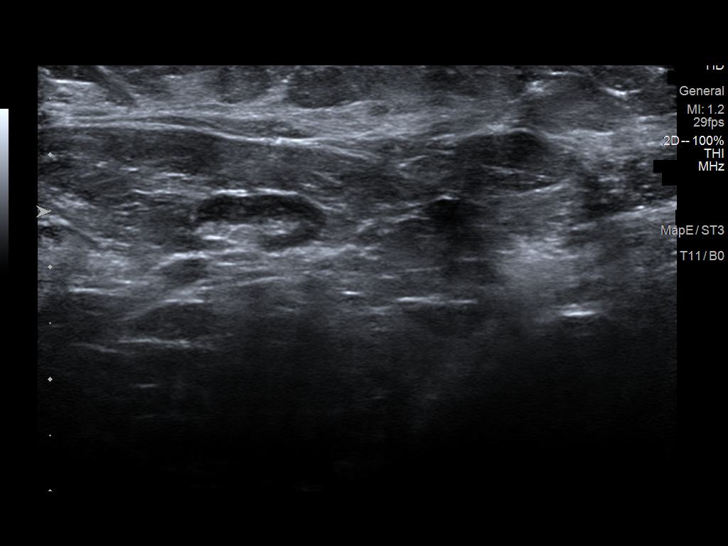

[13 of 14 positions shown; findings below may reference images not displayed]

ACR Breast Density Category c: The breast tissue is heterogeneously
dense, which may obscure small masses.
FINDINGS: There is an area of possible subtle distortion in the upper outer
left breast best visualized on the CC tomograms and felt to persist
on the spot compression images. Spot compression tangential
tomograms were performed over the palpable area of concern in the
left axilla demonstrating accessory fibroglandular tissue as well as
several partially visualized axillary lymph nodes which appear to
contain central lucency.

Mammographic images were processed with CAD.

Physical examination of the left axilla reveals generalized
fullness. Targeted ultrasound of the left axilla was performed
demonstrating fibroglandular tissue compatible with accessory breast
tissue. The lymph nodes are all of normal morphology, with no
abnormal axillary lymph nodes identified. Targeted ultrasound of the
upper-outer left breast was performed with no suspicious masses or
abnormality seen. No sonographic correlate for the possible
distortion seen in the upper-outer left breast at mammography.
IMPRESSION: 1. Subtle/possible area of distortion in the outer left breast best
visualized on the CC view.

2. Accessory breast tissue at site of palpable concern in the left
axilla. No axillary lymphadenopathy identified.

RECOMMENDATION:
Recommend stereotactic guided biopsy of the subtle/possible
distortion in the upper-outer left breast. It has been explained to
the patient that this is a subtle/questionable finding and if this
cannot be adequately reproduced when she returns on the day of
biopsy then short-term follow-up would be recommended.

I have discussed the findings and recommendations with the patient.
If applicable, a reminder letter will be sent to the patient
regarding the next appointment.

BI-RADS CATEGORY  4: Suspicious.

## 2022-09-21 NOTE — Telephone Encounter (Signed)
L/m for patient to contact me again ?

## 2022-09-21 NOTE — Telephone Encounter (Signed)
Patient returned call and was advised of submit to [facility] due to ins and to call to get update copay card info to give to pharmacy

## 2022-09-27 ENCOUNTER — Other Ambulatory Visit: Payer: Self-pay | Admitting: *Deleted

## 2022-09-27 MED ORDER — AMLODIPINE BESYLATE 10 MG PO TABS: 10.0000 mg | ORAL_TABLET | Freq: Every day | ORAL | 3 refills | Status: DC

## 2022-10-04 ENCOUNTER — Telehealth: Payer: Self-pay

## 2022-10-04 NOTE — Patient Outreach (Signed)
  Care Coordination   07/86/7544 Name: Jillian Vasquez MRN: [id number] DOB: [birth year]   Care Coordination Outreach Attempts:  A third unsuccessful outreach was attempted today to offer the patient with information about available care coordination services as a benefit of their health plan.   Follow Up Plan:  No further outreach attempts will be made at this time. We have been unable to contact the patient to offer or enroll patient in care coordination services  Encounter Outcome:  No Answer   Care Coordination Interventions:  No, not indicated    Jone Baseman, RN, MSN [facility] Care Management Coordinator Direct Line [phone]

## 2022-12-12 ENCOUNTER — Telehealth: Payer: Self-pay | Admitting: *Deleted

## 2022-12-12 NOTE — Patient Outreach (Signed)
  Care Coordination   0000000 Name: [name] MRN: [id number] DOB: 04/11/1976   Care Coordination Outreach Attempts:  An unsuccessful telephone outreach was attempted today to offer the patient information about available care coordination services as a benefit of their health plan.   Follow Up Plan:  Additional outreach attempts will be made to offer the patient care coordination information and services.   Encounter Outcome:  No Answer   Care Coordination Interventions:  No, not indicated    Eduard Clos, MSW, Christian Worker [facility] [phone]

## 2023-01-09 ENCOUNTER — Ambulatory Visit (INDEPENDENT_AMBULATORY_CARE_PROVIDER_SITE_OTHER): Payer: BC Managed Care – PPO | Admitting: Family Medicine

## 2023-01-09 ENCOUNTER — Other Ambulatory Visit: Payer: Self-pay

## 2023-01-09 ENCOUNTER — Other Ambulatory Visit: Payer: Self-pay | Admitting: Family Medicine

## 2023-01-09 ENCOUNTER — Encounter: Payer: Self-pay | Admitting: Family Medicine

## 2023-01-09 VITALS — BP 161/97 | HR 86 | Ht 61.0 in | Wt 191.8 lb

## 2023-01-09 DIAGNOSIS — I10 Essential (primary) hypertension: Secondary | ICD-10-CM

## 2023-01-09 DIAGNOSIS — Z0000 Encounter for general adult medical examination without abnormal findings: Secondary | ICD-10-CM

## 2023-01-09 DIAGNOSIS — N939 Abnormal uterine and vaginal bleeding, unspecified: Secondary | ICD-10-CM

## 2023-01-09 DIAGNOSIS — Z1231 Encounter for screening mammogram for malignant neoplasm of breast: Secondary | ICD-10-CM

## 2023-01-09 DIAGNOSIS — Z23 Encounter for immunization: Secondary | ICD-10-CM

## 2023-01-09 MED ORDER — TETANUS-DIPHTH-ACELL PERTUSSIS 5-2.5-18.5 LF-MCG/0.5 IM SUSY: 0.5000 mL | PREFILLED_SYRINGE | Freq: Once | INTRAMUSCULAR | 0 refills | Status: AC

## 2023-01-09 NOTE — Assessment & Plan Note (Signed)
Not well controlled CBC today Continue iron Referral to Gynecology

## 2023-01-09 NOTE — Patient Instructions (Signed)
It was wonderful to see you today.  Please bring ALL of your medications with you to every visit.   Today we talked about:   Please send in your home readings via MyChart    -Getting the Tetanus shot at your pharmacy--please take the prescription   - Going to get your annual labs   - [place]!!  - I recommend seeing Gynecology to discuss treatment for your heavy menses  - Keep taking your iron pills   Please follow up in 1 months bring your blood pressure readings with you   Thank you for choosing [facility].   Please call [phone] with any questions about today's appointment.  Please be sure to schedule follow up at the front  desk before you leave today.   Dorris Singh, MD  Family Medicine

## 2023-01-09 NOTE — Assessment & Plan Note (Signed)
Not at goal here BP readings at home at goal She will send readings  Follow up in 1 month to compare cuff May need additional agent--would add HCTZ

## 2023-01-09 NOTE — Progress Notes (Signed)
    SUBJECTIVE:   Chief compliant/HPI: annual examination  Jillian Vasquez is a 47 y.o. who presents today for an annual exam.   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 She feels relieved. Husband was diagnosed with HF last year. She now works at [employer]. Her daughter is the only child left in house---she will be attending pharmacy school in [place]. Other twin is Market researcher in [place].   She reports compliance with her medications. She denies side effects.  Asthma has been doing great since she started dupixent.   Hypertension BP goal: 130/80 Current medications: Amlodipine and losartan  Side effects: none Adherence: excellent   Home BP readings 120/80. Denies headaches or chest pain.  AUB The patient has a history of heavy menses. Menses are regular. Generally very heavy. She sometimes has 'cyst flares' afterwards. She is interested in seeing Gynecology to discuss options such as hysterectomy or ablation. She is taking iron pills. Has BTL. UTD on Pap.,   History tabs reviewed and updated .   Review of systems form reviewed and notable for only as above--mostly heavy menses.   OBJECTIVE:   BP (!) 161/97   Pulse 86   Ht 5\' 1"  (1.549 m)   Wt 191 lb 12.8 oz (87 kg)   LMP 01/08/2023   SpO2 100%   BMI 36.24 kg/m   HEENT: EOMI. Sclera without injection or icterus. MMM. External auditory canal examined and WNL. TM normal appearance, no erythema or bulging. Has moderate cerumen Neck: Supple.  Cardiac: Regular rate and rhythm. Normal S1/S2. No murmurs, rubs, or gallops appreciated. Lungs: Clear bilaterally to ascultation.  Abdomen: Normoactive bowel sounds. No tenderness to deep or light palpation. No rebound or guarding.    Neuro: Normal speech  Ext: no edema   Psych: Pleasant and appropriate    ASSESSMENT/PLAN:   Essential hypertension Not at goal here BP readings at home at goal She will send readings  Follow up in 1 month to compare cuff May need additional  agent--would add HCTZ   Abnormal uterine bleeding Not well controlled CBC today Continue iron Referral to Gynecology     Annual Examination  See AVS for age appropriate recommendations.   PHQ score 0, reviewed and discussed.  Blood pressure reviewed and plan as above.  The patient currently uses BTL for contraception. Folate recommended as appropriate, minimum of 400 mcg per day.   Considered the following items based upon USPSTF recommendations: Diabetes screening: ordered Screening for elevated cholesterol: ordered Reviewed risk factors for latent tuberculosis and not indicated  Cervical cancer screening: prior Pap reviewed, repeat due in 2026 Breast cancer screening: discussed potential benefits, risks including overdiagnosis and biopsy, elected proceed with mammogram Colorectal cancer screening: up to date on screening for CRC.  Follow up in 1 month for BP--she is to bring home cuff     [place], MD [place]

## 2023-01-10 LAB — BASIC METABOLIC PANEL
BUN/Creatinine Ratio: 10 (ref 9–23)
BUN: 11 mg/dL (ref 6–24)
CO2: 21 mmol/L (ref 20–29)
Calcium: 9 mg/dL (ref 8.7–10.2)
Chloride: 105 mmol/L (ref 96–106)
Creatinine, Ser: 1.13 mg/dL — ABNORMAL HIGH (ref 0.57–1.00)
Glucose: 108 mg/dL — ABNORMAL HIGH (ref 70–99)
Potassium: 4.3 mmol/L (ref 3.5–5.2)
Sodium: 139 mmol/L (ref 134–144)
eGFR: 61 mL/min/{1.73_m2} (ref >59)

## 2023-01-10 LAB — CBC
Hematocrit: 39.2 % (ref 34.0–46.6)
Hemoglobin: 12.6 g/dL (ref 11.1–15.9)
MCH: 26 pg — ABNORMAL LOW (ref 26.6–33.0)
MCHC: 32.1 g/dL (ref 31.5–35.7)
MCV: 81 fL (ref 79–97)
Platelets: 234 10*3/uL (ref 150–450)
RBC: 4.85 x10E6/uL (ref 3.77–5.28)
RDW: 14.9 % (ref 11.7–15.4)
WBC: 6.2 10*3/uL (ref 3.4–10.8)

## 2023-01-10 LAB — LIPID PANEL
Chol/HDL Ratio: 4.5 ratio — ABNORMAL HIGH (ref 0.0–4.4)
Cholesterol, Total: 175 mg/dL (ref 100–199)
HDL: 39 mg/dL — ABNORMAL LOW (ref >39)
LDL Chol Calc (NIH): 122 mg/dL — ABNORMAL HIGH (ref 0–99)
Triglycerides: 72 mg/dL (ref 0–149)
VLDL Cholesterol Cal: 14 mg/dL (ref 5–40)

## 2023-01-10 LAB — HEMOGLOBIN A1C
Est. average glucose Bld gHb Est-mCnc: 114 mg/dL
Hgb A1c MFr Bld: 5.6 % (ref 4.8–5.6)

## 2023-01-10 LAB — FERRITIN: Ferritin: 27 ng/mL (ref 15–150)

## 2023-02-03 NOTE — Progress Notes (Unsigned)
    SUBJECTIVE:   CHIEF COMPLAINT: BP follow up HPI:   Jillian Vasquez is a 47 y.o.  with history notable for hypertension and AUB  presenting for BP follow up.   Hypertension BP goal: <130/80 Current medications: Amlodipine and losartan  Side effects: None Adherence: Excellent Reports elevated home readings. 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 Denies HA, vision changes, chest pain. She is determined to lose weight--has a home gym. She would like to think about semaglutide.    PERTINENT  PMH / PSH/Family/Social History : AUB-referred to Gynecology, history of fibroids  OBJECTIVE:   BP (!) 158/92   Pulse 82   Ht 5\' 1"  (1.549 m)   Wt 198 lb (89.8 kg)   LMP 01/08/2023   SpO2 99%   BMI 37.41 kg/m   Today's weight:  Last Weight  Most recent update: 02/06/2023  8:32 AM    Weight  89.8 kg (198 lb)            Review of prior weights: [employer]   02/06/23 0832  Weight: 198 lb (89.8 kg)     Cardiac: Regular rate and rhythm. Normal S1/S2. No murmurs, rubs, or gallops appreciated. Lungs: Clear bilaterally to ascultation.  Abdomen: Normoactive bowel sounds. No tenderness to deep or light palpation. No rebound or guarding.   Psych: Pleasant and appropriate    ASSESSMENT/PLAN:   Essential hypertension Not at goal.  Discussed options She would like to focus on dietary changes Stop losartan---start combo pill BMP at follow up  Information given on Semaglutide--discussed--she will call if she wants to start   HCM Tdap today    Terisa Starr, MD  Family Medicine Teaching Service  [facility]

## 2023-02-06 ENCOUNTER — Encounter: Payer: Self-pay | Admitting: Family Medicine

## 2023-02-06 ENCOUNTER — Ambulatory Visit: Payer: BC Managed Care – PPO | Admitting: Family Medicine

## 2023-02-06 ENCOUNTER — Other Ambulatory Visit: Payer: Self-pay

## 2023-02-06 ENCOUNTER — Telehealth: Payer: Self-pay | Admitting: *Deleted

## 2023-02-06 VITALS — BP 158/92 | HR 82 | Ht 61.0 in | Wt 198.0 lb

## 2023-02-06 DIAGNOSIS — H524 Presbyopia: Secondary | ICD-10-CM | POA: Diagnosis not present

## 2023-02-06 DIAGNOSIS — Z23 Encounter for immunization: Secondary | ICD-10-CM | POA: Diagnosis not present

## 2023-02-06 DIAGNOSIS — I10 Essential (primary) hypertension: Secondary | ICD-10-CM

## 2023-02-06 MED ORDER — LOSARTAN POTASSIUM-HCTZ 50-12.5 MG PO TABS: 1.0000 | ORAL_TABLET | Freq: Every day | ORAL | 3 refills | Status: DC

## 2023-02-06 NOTE — Telephone Encounter (Signed)
Patient called to advise new Ins and I have located same. I reached out to patient to advise MD appt needed since it has been six months o that new Ins will approve same

## 2023-02-06 NOTE — Assessment & Plan Note (Signed)
Not at goal.  Discussed options She would like to focus on dietary changes Stop losartan---start combo pill BMP at follow up

## 2023-02-06 NOTE — Patient Instructions (Addendum)
It was wonderful to see you today.  Please bring ALL of your medications with you to every visit.   Today we talked about:  -STOP your losartan - START the new combination losartan-HCTZ - We will check your blood work at follow up - Follow up in 2 weeks  Let me know your thoughts on the [facility]   Please follow up in 3 weeks  Thank you for choosing [facility].   Please call [phone] with any questions about today's appointment.  Please be sure to schedule follow up at the front  desk before you leave today.   Terisa Starr, MD  Family Medicine

## 2023-02-13 ENCOUNTER — Other Ambulatory Visit: Payer: Self-pay | Admitting: Family Medicine

## 2023-02-13 ENCOUNTER — Ambulatory Visit: Payer: BC Managed Care – PPO | Admitting: Family Medicine

## 2023-02-13 ENCOUNTER — Other Ambulatory Visit: Payer: Self-pay

## 2023-02-13 ENCOUNTER — Encounter: Payer: Self-pay | Admitting: Family Medicine

## 2023-02-13 VITALS — BP 132/80 | HR 87 | Temp 98.2°F | Resp 16 | Wt 195.7 lb

## 2023-02-13 DIAGNOSIS — J339 Nasal polyp, unspecified: Secondary | ICD-10-CM | POA: Diagnosis not present

## 2023-02-13 DIAGNOSIS — J4531 Mild persistent asthma with (acute) exacerbation: Secondary | ICD-10-CM

## 2023-02-13 DIAGNOSIS — J3089 Other allergic rhinitis: Secondary | ICD-10-CM | POA: Diagnosis not present

## 2023-02-13 DIAGNOSIS — J302 Other seasonal allergic rhinitis: Secondary | ICD-10-CM | POA: Diagnosis not present

## 2023-02-13 DIAGNOSIS — H1010 Acute atopic conjunctivitis, unspecified eye: Secondary | ICD-10-CM | POA: Insufficient documentation

## 2023-02-13 DIAGNOSIS — J4520 Mild intermittent asthma, uncomplicated: Secondary | ICD-10-CM

## 2023-02-13 DIAGNOSIS — J4540 Moderate persistent asthma, uncomplicated: Secondary | ICD-10-CM

## 2023-02-13 DIAGNOSIS — Z9889 Other specified postprocedural states: Secondary | ICD-10-CM

## 2023-02-13 MED ORDER — BUDESONIDE-FORMOTEROL FUMARATE 80-4.5 MCG/ACT IN AERO: 2.0000 | INHALATION_SPRAY | Freq: Two times a day (BID) | RESPIRATORY_TRACT | 2 refills | Status: DC

## 2023-02-13 MED ORDER — ALBUTEROL SULFATE HFA 108 (90 BASE) MCG/ACT IN AERS
2.0000 | INHALATION_SPRAY | RESPIRATORY_TRACT | 2 refills | Status: DC | PRN
Start: 2023-02-13 — End: 2023-03-17

## 2023-02-13 NOTE — Progress Notes (Signed)
[address] [place] [address] Dept: [phone]  FOLLOW UP NOTE  Patient ID: Jillian Vasquez, female    DOB: 05-27-1976  Age: 47 y.o. MRN: [id number] Date of Office Visit: 02/13/2023  Assessment  Chief Complaint: Follow-up (Dupixent reapproval)  HPI Jillian Vasquez is a 47 year old female who presents to the clinic for follow-up visit.  She was last seen in this clinic on 08/17/2022 by Thermon Leyland, FNP, for evaluation of asthma, allergic rhinitis, and nasal polyposis.    At today's visit, she reports her asthma has been well-controlled with no symptoms including shortness of breath, cough, or wheeze with activity or rest.  She is not currently using Symbicort and has not used albuterol since her last visit to this clinic.  She continues at home administration of Dupixent injections 300 mg once every 2 weeks with no large or local reactions.  She reports a significant decrease in her symptoms of asthma while continuing on Dupixent injections.   Allergic rhinitis is reported as well-controlled with occasional nasal congestion as the main symptom.  She is not currently taking an antihistamine, using Flonase nasal spray, or using saline nasal rinses.  Her last environmental allergy testing was on 07/15/2021 and was positive to grass pollen, mold, and cat. Allergic conjunctivitis is reported as well-controlled with no medical intervention at this time.  Nasal polyposis is reported as well controlled with no ageusia or anosmia. She continues Dupixent injections once every 2 weeks and reports a significant decrease in her symptoms of nasal polyposis.  Her current medications are listed in the chart.   Drug Allergies:  Allergies  Allergen Reactions   Lisinopril Cough    Physical Exam: BP 132/80   Pulse 87   Temp 98.2 F (36.8 C) (Temporal)   Resp 16   Wt 195 lb 11.2 oz (88.8 kg)   SpO2 97%   BMI 36.98 kg/m    Physical Exam Vitals reviewed.   Constitutional:      Appearance: Normal appearance.  HENT:     Head: Normocephalic and atraumatic.     Right Ear: Tympanic membrane normal.     Left Ear: Tympanic membrane normal.     Nose:     Comments: Bilateral nares slightly erythematous with clear nasal drainage noted.  Pharynx normal.  Ears normal.  Eyes normal.    Mouth/Throat:     Pharynx: Oropharynx is clear.  Eyes:     Conjunctiva/sclera: Conjunctivae normal.  Cardiovascular:     Rate and Rhythm: Normal rate and regular rhythm.     Heart sounds: Normal heart sounds. No murmur heard. Pulmonary:     Effort: Pulmonary effort is normal.     Breath sounds: Normal breath sounds.     Comments: Lungs clear to auscultation Musculoskeletal:        General: Normal range of motion.     Cervical back: Normal range of motion and neck supple.  Skin:    General: Skin is warm and dry.  Neurological:     Mental Status: She is alert and oriented to person, place, and time.  Psychiatric:        Mood and Affect: Mood normal.        Behavior: Behavior normal.        Thought Content: Thought content normal.        Judgment: Judgment normal.     Diagnostics: FVC 2.35 which is 86% of predicted value, FEV1 1.9 which is 89% of predicted value.  Spirometry indicates normal ventilatory function.  Assessment and Plan: 1. Moderate persistent asthma without complication   2. Seasonal and perennial allergic rhinitis   3. Nasal polyps   4. Mild persistent asthma with acute exacerbation   5. Mild intermittent asthma, unspecified whether complicated     Meds ordered this encounter  Medications   budesonide-formoterol (SYMBICORT) 80-4.5 MCG/ACT inhaler    Sig: Inhale 2 puffs into the lungs in the morning and at bedtime.    Dispense:  1 each    Refill:  2    Please hold on file. Patient will call when needed. Thank you   albuterol (VENTOLIN HFA) 108 (90 Base) MCG/ACT inhaler    Sig: Inhale 2 puffs into the lungs every 4 (four) hours as  needed for wheezing or shortness of breath.    Dispense:  18 g    Refill:  2    Please hold on file. Patient will call when needed. Thank you    Patient Instructions  Asthma Continue albuterol 2 puffs once every 4 hours as needed for cough or wheeze You may use albuterol 2 puffs 5 to 15 minutes before activity to decrease cough or wheeze For asthma flare, begin Symbicort 80-2 puffs twice a day  Allergic rhinitis Continue allergen avoidance measures directed toward grass pollen, mold, and cat as listed below Continue cetirizine 10 mg once a day as needed for runny nose or itch Continue Flonase 2 sprays in each nostril once a day as needed for stuffy nose Consider saline nasal rinses as needed for nasal symptoms. Use this before any medicated nasal sprays for best result  Nasal polyposis Continue Flonase and nasal rinses as listed above Continue Dupixent 300 mg injections once every 2 weeks per protocol  Call the clinic if this treatment plan is not working well for you.  Follow up in 6 months or sooner if needed.   Return in about 6 months (around 08/15/2023), or if symptoms worsen or fail to improve.    Thank you for the opportunity to care for this patient.  Please do not hesitate to contact me with questions.  Thermon Leyland, FNP [facility]

## 2023-02-13 NOTE — Patient Instructions (Addendum)
Asthma Continue albuterol 2 puffs once every 4 hours as needed for cough or wheeze You may use albuterol 2 puffs 5 to 15 minutes before activity to decrease cough or wheeze For asthma flare, begin Symbicort 80-2 puffs twice a day  Allergic rhinitis Continue allergen avoidance measures directed toward grass pollen, mold, and cat as listed below Continue cetirizine 10 mg once a day as needed for runny nose or itch Continue Flonase 2 sprays in each nostril once a day as needed for stuffy nose Consider saline nasal rinses as needed for nasal symptoms. Use this before any medicated nasal sprays for best result  Nasal polyposis Continue Flonase and nasal rinses as listed above Continue Dupixent 300 mg injections once every 2 weeks per protocol  Call the clinic if this treatment plan is not working well for you.  Follow up in 6 months or sooner if needed.  Reducing Pollen Exposure The American Academy of Allergy, Asthma and Immunology suggests the following steps to reduce your exposure to pollen during allergy seasons. Do not hang sheets or clothing out to dry; pollen may collect on these items. Do not mow lawns or spend time around freshly cut grass; mowing stirs up pollen. Keep windows closed at night.  Keep car windows closed while driving. Minimize morning activities outdoors, a time when pollen counts are usually at their highest. Stay indoors as much as possible when pollen counts or humidity is high and on windy days when pollen tends to remain in the air longer. Use air conditioning when possible.  Many air conditioners have filters that trap the pollen spores. Use a HEPA room air filter to remove pollen form the indoor air you breathe.  Control of Mold Allergen Mold and fungi can grow on a variety of surfaces provided certain temperature and moisture conditions exist.  Outdoor molds grow on plants, decaying vegetation and soil.  The major outdoor mold, Alternaria and Cladosporium,  are found in very high numbers during hot and dry conditions.  Generally, a late Summer - Fall peak is seen for common outdoor fungal spores.  Rain will temporarily lower outdoor mold spore count, but counts rise rapidly when the rainy period ends.  The most important indoor molds are Aspergillus and Penicillium.  Dark, humid and poorly ventilated basements are ideal sites for mold growth.  The next most common sites of mold growth are the bathroom and the kitchen.  Outdoor [employer] Use air conditioning and keep windows closed Avoid exposure to decaying vegetation. Avoid leaf raking. Avoid grain handling. Consider wearing a face mask if working in moldy areas.  Indoor Mold Control Maintain humidity below 50%. Clean washable surfaces with 5% bleach solution. Remove sources e.g. Contaminated carpets.  Control of Dog or Cat Allergen Avoidance is the best way to manage a dog or cat allergy. If you have a dog or cat and are allergic to dog or cats, consider removing the dog or cat from the home. If you have a dog or cat but don't want to find it a new home, or if your family wants a pet even though someone in the household is allergic, here are some strategies that may help keep symptoms at bay:  Keep the pet out of your bedroom and restrict it to only a few rooms. Be advised that keeping the dog or cat in only one room will not limit the allergens to that room. Don't pet, hug or kiss the dog or cat; if you do, wash your  hands with soap and water. High-efficiency particulate air (HEPA) cleaners run continuously in a bedroom or living room can reduce allergen levels over time. Regular use of a high-efficiency vacuum cleaner or a central vacuum can reduce allergen levels. Giving your dog or cat a bath at least once a week can reduce airborne allergen.

## 2023-02-22 ENCOUNTER — Ambulatory Visit
Admission: RE | Admit: 2023-02-22 | Discharge: 2023-02-22 | Disposition: A | Discharge status: Home / Self Care | Payer: BC Managed Care – PPO | Source: Ambulatory Visit | Attending: Family Medicine | Admitting: Family Medicine

## 2023-02-22 ENCOUNTER — Encounter: Payer: Self-pay | Admitting: Family Medicine

## 2023-02-22 DIAGNOSIS — Z9889 Other specified postprocedural states: Secondary | ICD-10-CM

## 2023-02-22 DIAGNOSIS — Z1231 Encounter for screening mammogram for malignant neoplasm of breast: Secondary | ICD-10-CM | POA: Diagnosis not present

## 2023-03-10 ENCOUNTER — Ambulatory Visit: Payer: BC Managed Care – PPO | Admitting: Family Medicine

## 2023-03-10 VITALS — BP 155/99 | HR 74 | Ht 61.0 in | Wt 195.2 lb

## 2023-03-10 DIAGNOSIS — I10 Essential (primary) hypertension: Secondary | ICD-10-CM | POA: Diagnosis not present

## 2023-03-10 DIAGNOSIS — K921 Melena: Secondary | ICD-10-CM | POA: Diagnosis not present

## 2023-03-10 LAB — POCT HEMOGLOBIN: Hemoglobin: 12.9 g/dL (ref 11–14.6)

## 2023-03-10 NOTE — Patient Instructions (Addendum)
It was nice seeing you today!  Try to work on increasing her fiber intake, water intake.  You can take MiraLAX as needed to achieve soft stools.  Please let us know if you are developing frank blood in your stool, dark or tarry stools, or vomiting up blood.  Stay well, Jillian Deeds, MD [facility] [phone]  --  Make sure to check out at the front desk before you leave today.  Please arrive at least 15 minutes prior to your scheduled appointments.  If you had blood work today, I will send you a MyChart message or a letter if results are normal. Otherwise, I will give you a call.  If you had a referral placed, they will call you to set up an appointment. Please give [place] a call if you don't hear back in the next 2 weeks.  If you need additional refills before your next appointment, please call your pharmacy first.

## 2023-03-10 NOTE — Progress Notes (Signed)
    SUBJECTIVE:   CHIEF COMPLAINT / HPI:  Chief Complaint  Patient presents with   Blood In Stools    Patient report she noticed some blood with her BM since yesterday. She noticed some blood with wiping, had to wipe the blood 3 times. Had more blood when wiping this AM. Unsure if blood in the stool. Sometimes having issues with constipation, has to strain at times. No pain with BM.  Had colonoscopy in the past 2-3 years, reports this was normal.  She reports adherence to her BP medications.  PERTINENT  PMH / PSH: HTN, asthma, AUB  Patient Care Team: Westley Chandler, MD as PCP - General (Family Medicine)   OBJECTIVE:   BP (!) 145/94   Pulse 74   Ht 5\' 1"  (1.549 m)   Wt 195 lb 3.2 oz (88.5 kg)   LMP 02/20/2023   SpO2 100%   BMI 36.88 kg/m   Physical Exam Constitutional:      General: She is not in acute distress. Cardiovascular:     Rate and Rhythm: Normal rate and regular rhythm.  Pulmonary:     Effort: Pulmonary effort is normal. No respiratory distress.     Breath sounds: Normal breath sounds.  Musculoskeletal:     Cervical back: Neck supple.  Neurological:     Mental Status: She is alert.         03/10/2023   10:57 AM  Depression screen PHQ 2/9  Decreased Interest 0  Down, Depressed, Hopeless 0  PHQ - 2 Score 0  Altered sleeping 0  Tired, decreased energy 0  Change in appetite 0  Feeling bad or failure about yourself  0  Trouble concentrating 0  Moving slowly or fidgety/restless 0  Suicidal thoughts 0  PHQ-9 Score 0  Difficult doing work/chores Not difficult at all    Results for orders placed or performed in visit on 03/10/23 (from the past 48 hour(s))  POCT hemoglobin     Status: None   Collection Time: 03/10/23 11:31 AM  Result Value Ref Range   Hemoglobin 12.9 11 - 14.6 g/dL     {Show previous vital signs (optional):23777}    ASSESSMENT/PLAN:   1. Bloody stool Patient with blood noted when wiping for the past 2 days with bowel  movements, no known blood in stool.  Reports underlying constipation.  No rectal pain.  Suspect that she probably has hemorrhoids, does not seem to be any life-threatening cause of bleeding. Discussed increase fiber intake, hydration, MiraLAX as needed.  Patient declined rectal exam.  Recent normal colonoscopy in 2022. - POCT hemoglobin in normal limits - discussed return precautions  2. Essential hypertension Uncontrolled. Offered to check BMP due to recent increase in medications, patient declined at this time. Advised to follow-up with PCP.    Return if symptoms worsen or fail to improve.   Littie Deeds, MD [facility]

## 2023-03-16 NOTE — Progress Notes (Signed)
    SUBJECTIVE:   CHIEF COMPLAINT: heavy menses  HPI:   Jillian Vasquez is a 47 y.o.  with history notable for HTN and atopy presenting for BP follow up.   Hypertension BP goal: <130/80 Current medications: Amlodipine, Hyzaar Side effects: None Adherence: Good    The patient reports her major concern today is related to her heavy menstrual bleeding.  She was referred to gynecology back in March--was playing phone tag with this office, now planning to go to [employer].  Since that time she has had 2 menstrual periods which are very heavy.  1 lasted 8 days.  She just is coming off of her period and still bleeding slightly today.  She went through 10 heavy pads in a day with this.  She does pass clot.  She feels tired.  She denies syncope, palpitations, chest pain.  Denies pica.  She is interested in seeing gynecology for evaluation.  We discussed pelvic exam today which she declined.   PERTINENT  PMH / PSH/Family/Social History : updated  OBJECTIVE:   BP 138/88   Pulse 75   Wt 197 lb 12.8 oz (89.7 kg)   LMP 03/17/2023 (Exact Date)   SpO2 100%   BMI 37.37 kg/m   Today's weight:  Last Weight  Most recent update: 03/17/2023  8:40 AM    Weight  89.7 kg (197 lb 12.8 oz)            Review of prior weights: [employer]   03/17/23 0840  Weight: 197 lb 12.8 oz (89.7 kg)     Cardiac: Regular rate and rhythm. Normal S1/S2. No murmurs, rubs, or gallops appreciated. Lungs: Clear bilaterally to ascultation.  Abdomen: Normoactive bowel sounds. No tenderness to deep or light palpation. No rebound or guarding.  Psych: Pleasant and appropriate    ASSESSMENT/PLAN:   Essential hypertension Assessment & Plan: At goal, BMP today UACR at next visit Continue current medications   Orders: -     Basic metabolic panel  Mild intermittent asthma, unspecified whether complicated -     Albuterol Sulfate HFA; Inhale 2 puffs into the lungs every 4 (four) hours as needed  for wheezing or shortness of breath.  Dispense: 18 g; Refill: 2 -     Budesonide-Formoterol Fumarate; Inhale 2 puffs into the lungs in the morning and at bedtime.  Dispense: 1 each; Refill: 2  Abnormal uterine bleeding Assessment & Plan: Discussed possible causes including atypical cells, endometrial hyperplasia, malignancy---recommended biopsy with Gynecology Has known history of fibroids New referral to [facility] Discuss options--gave Provera 10 mg for 10 days to take for heavy bleeding She will call if she uses this Ferritin and CBC again today--if markedly anemia, will plan for IV iron   Orders: -     Ambulatory referral to Gynecology -     CBC -     Ferritin -     medroxyPROGESTERone Acetate; Take 1 tablet (10 mg total) by mouth daily as needed. Please take for 10 days if your menses returns--call if you take this medications  Dispense: 15 tablet; Refill: 0  Recent Rectal Bleeding Reports this is entirely resolved today Only occurred with wiping, improve with increased fiber Has had colonoscopy If recurs, discussed need to call and see GI for consideration of repeat colonoscopy   Terisa Starr, MD  Family Medicine Teaching Service  [facility]

## 2023-03-17 ENCOUNTER — Ambulatory Visit: Payer: BC Managed Care – PPO | Admitting: Family Medicine

## 2023-03-17 ENCOUNTER — Encounter: Payer: Self-pay | Admitting: Family Medicine

## 2023-03-17 VITALS — BP 138/88 | HR 75 | Wt 197.8 lb

## 2023-03-17 DIAGNOSIS — I10 Essential (primary) hypertension: Secondary | ICD-10-CM

## 2023-03-17 DIAGNOSIS — N939 Abnormal uterine and vaginal bleeding, unspecified: Secondary | ICD-10-CM

## 2023-03-17 DIAGNOSIS — J4531 Mild persistent asthma with (acute) exacerbation: Secondary | ICD-10-CM

## 2023-03-17 DIAGNOSIS — J4520 Mild intermittent asthma, uncomplicated: Secondary | ICD-10-CM | POA: Diagnosis not present

## 2023-03-17 MED ORDER — ALBUTEROL SULFATE HFA 108 (90 BASE) MCG/ACT IN AERS
2.0000 | INHALATION_SPRAY | RESPIRATORY_TRACT | 2 refills | Status: DC | PRN
Start: 2023-03-17 — End: 2024-01-23

## 2023-03-17 MED ORDER — MEDROXYPROGESTERONE ACETATE 10 MG PO TABS
10.0000 mg | ORAL_TABLET | Freq: Every day | ORAL | 0 refills | Status: DC | PRN
Start: 2023-03-17 — End: 2023-12-01

## 2023-03-17 MED ORDER — BUDESONIDE-FORMOTEROL FUMARATE 80-4.5 MCG/ACT IN AERO
2.0000 | INHALATION_SPRAY | Freq: Two times a day (BID) | RESPIRATORY_TRACT | 2 refills | Status: DC
Start: 2023-03-17 — End: 2024-01-23

## 2023-03-17 NOTE — Patient Instructions (Signed)
It was wonderful to see you today.  Please bring ALL of your medications with you to every visit.   Today we talked about:  -Getting blood work  I have referred you to [facility]ne[facility]  to further evaluate your concern. If you do not received a phone call about this appointment within 2 weeks, please call our office back at [phone]. Jazmin Hartsell coordinates our referrals and can assist you in this.    If your period returns you can take Provera 10 mg daily for 10 days--call me if you use this  Your Gynecologist may want to do an endometrial biopsy  Please continue your blood pressure pills   Please follow up in 6 months   Thank you for choosing [facility].   Please call [phone] with any questions about today's appointment.  Please be sure to schedule follow up at the front  desk before you leave today.   Terisa Starr, MD  Family Medicine

## 2023-03-17 NOTE — Assessment & Plan Note (Signed)
At goal, BMP today UACR at next visit Continue current medications

## 2023-03-17 NOTE — Assessment & Plan Note (Signed)
Discussed possible causes including atypical cells, endometrial hyperplasia, malignancy---recommended biopsy with Gynecology Has known history of fibroids New referral to [facility] Discuss options--gave Provera 10 mg for 10 days to take for heavy bleeding She will call if she uses this Ferritin and CBC again today--if markedly anemia, will plan for IV iron

## 2023-03-18 LAB — BASIC METABOLIC PANEL
BUN/Creatinine Ratio: 13 (ref 9–23)
BUN: 15 mg/dL (ref 6–24)
CO2: 24 mmol/L (ref 20–29)
Calcium: 8.3 mg/dL — ABNORMAL LOW (ref 8.7–10.2)
Chloride: 103 mmol/L (ref 96–106)
Creatinine, Ser: 1.19 mg/dL — ABNORMAL HIGH (ref 0.57–1.00)
Glucose: 101 mg/dL — ABNORMAL HIGH (ref 70–99)
Potassium: 3.7 mmol/L (ref 3.5–5.2)
Sodium: 140 mmol/L (ref 134–144)
eGFR: 57 mL/min/{1.73_m2} — ABNORMAL LOW (ref >59)

## 2023-03-18 LAB — CBC
Hematocrit: 33.5 % — ABNORMAL LOW (ref 34.0–46.6)
Hemoglobin: 10.5 g/dL — ABNORMAL LOW (ref 11.1–15.9)
MCH: 25.3 pg — ABNORMAL LOW (ref 26.6–33.0)
MCHC: 31.3 g/dL — ABNORMAL LOW (ref 31.5–35.7)
MCV: 81 fL (ref 79–97)
Platelets: 227 10*3/uL (ref 150–450)
RBC: 4.15 x10E6/uL (ref 3.77–5.28)
RDW: 14.2 % (ref 11.7–15.4)
WBC: 6.2 10*3/uL (ref 3.4–10.8)

## 2023-03-18 LAB — FERRITIN: Ferritin: 27 ng/mL (ref 15–150)

## 2023-06-13 DIAGNOSIS — G4733 Obstructive sleep apnea (adult) (pediatric): Secondary | ICD-10-CM | POA: Diagnosis not present

## 2023-07-14 DIAGNOSIS — G4733 Obstructive sleep apnea (adult) (pediatric): Secondary | ICD-10-CM | POA: Diagnosis not present

## 2023-08-13 DIAGNOSIS — G4733 Obstructive sleep apnea (adult) (pediatric): Secondary | ICD-10-CM | POA: Diagnosis not present

## 2023-09-11 ENCOUNTER — Other Ambulatory Visit: Payer: Self-pay | Admitting: Allergy & Immunology

## 2023-10-13 ENCOUNTER — Other Ambulatory Visit: Payer: Self-pay

## 2023-10-13 MED ORDER — AMLODIPINE BESYLATE 10 MG PO TABS: 10.0000 mg | ORAL_TABLET | Freq: Every day | ORAL | 3 refills | Status: DC

## 2023-11-21 DIAGNOSIS — G4733 Obstructive sleep apnea (adult) (pediatric): Secondary | ICD-10-CM | POA: Diagnosis not present

## 2023-11-30 NOTE — Progress Notes (Signed)
 SUBJECTIVE:   Chief compliant/HPI: annual examination  Jillian Vasquez is a 48 y.o. who presents today for an annual exam.  She reports she is expecting her first grandchild in September.  She overall is doing very well.   The patient reports she had heavy menses from January 18 through February 7.  She reports passing clots.  She is not bleeding currently.  She has follow-up with gynecology next week.  She is interested in hysterectomy.  She follows with gynecology for her women's care.  She denies signs or symptoms of anemia including chest pain dyspnea palpitations syncope or fatigue.  She has been working hard on weight loss and exercising regularly.  Her weight was down to 180 pounds a few weeks ago.  Congratulated on her changes  The patient does report a family history in her mother in 2015 of an aortic dissection it seems.  It sounds like her mom also has aortic valvular insufficiency with this.  No other family members have similar valvular insufficiency or aortic disease.  The patient does have hypertension controlled now on 3 agents.  She does not smoke.  She maintains a healthy lifestyle.  The patient reports continued asymmetry of the left axilla as compared to the right.  She was previously told this was potentially an axillary adipose tissue.  She denies lymphadenopathy or night sweats no breast masses or discharge.  History of type updated.  Review of systems negative for chest pain dyspnea syncope or other signs or symptoms of anemia.    OBJECTIVE:   BP 135/85   Pulse 88   Ht 5' 1 (1.549 m)   Wt 198 lb (89.8 kg)   SpO2 100%   BMI 37.41 kg/m    HEENT: EOMI. Sclera without injection or icterus. MMM. External auditory canal examined and WNL. TM normal appearance, no erythema or bulging. Moderate small amount hard wax in R ear  Neck: Supple.  Cardiac: Regular rate and rhythm. Normal S1/S2. No murmurs, rubs, or gallops appreciated. Lungs: Clear  bilaterally to ascultation.  Abdomen: Normoactive bowel sounds. No tenderness to deep or light palpation. No rebound or guarding.     Psych: Pleasant and appropriate   Chaperoned exam Bilateral breasts exam L axilla without adenopathy but + adipose  ASSESSMENT/PLAN:   Assessment & Plan Annual physical exam Discussed and congratulated on her healthy lifestyle changes and weight loss.  No other high risk behaviors identified. Abnormal uterine bleeding CBC and ferritin today.  She is follow-up with gynecology suspect due to fibroids.  Discussed repeat ultrasound today she will get with her gynecologist for this instead Essential hypertension Near goal and at goal at home.  Home readings 120s to 130s over 80s.  She is on an ARB, calcium channel blocker and thiazide like diuretic. Overweight(278.02) Congratulated weight changes A1c today Family history of aortic dissection Discussed imperative nature of tight blood pressure control.  Will check a renin aldosterone level today given her family history and high blood pressure.  Referral to cardiology to see if they would recommend additional imaging or echocardiogram. Axillary tissue changes   Left axillary skin changes potential adipose tissue.  Exam as above no concerning findings no lymphadenopathy.  Will obtain repeat ultrasound.    Annual Examination  See AVS for age appropriate recommendations.   PHQ score 0, reviewed and discussed.  Blood pressure reviewed and at goal .  Asked about intimate partner violence and resources given as appropriate  The patient currently uses  tubal  for contraception. Folate recommended as appropriate, minimum of 400 mcg per day.   Considered the following items based upon USPSTF recommendations: Diabetes screening: ordered Screening for elevated cholesterol: ordered Breast cancer screening: discussed and ordered mammogram based upon personal history  Colorectal cancer screening:  UTD     Follow up  in 3 months    Elmira Olkowski CHRISTELLA Daring, MD [facility]

## 2023-12-01 ENCOUNTER — Other Ambulatory Visit: Payer: Self-pay | Admitting: Family Medicine

## 2023-12-01 ENCOUNTER — Ambulatory Visit (INDEPENDENT_AMBULATORY_CARE_PROVIDER_SITE_OTHER): Payer: BC Managed Care – PPO | Admitting: Family Medicine

## 2023-12-01 ENCOUNTER — Encounter: Payer: Self-pay | Admitting: Family Medicine

## 2023-12-01 VITALS — BP 135/85 | HR 88 | Ht 61.0 in | Wt 198.0 lb

## 2023-12-01 DIAGNOSIS — I10 Essential (primary) hypertension: Secondary | ICD-10-CM | POA: Diagnosis not present

## 2023-12-01 DIAGNOSIS — E663 Overweight: Secondary | ICD-10-CM

## 2023-12-01 DIAGNOSIS — Z0000 Encounter for general adult medical examination without abnormal findings: Secondary | ICD-10-CM

## 2023-12-01 DIAGNOSIS — Z8249 Family history of ischemic heart disease and other diseases of the circulatory system: Secondary | ICD-10-CM

## 2023-12-01 DIAGNOSIS — E65 Localized adiposity: Secondary | ICD-10-CM

## 2023-12-01 DIAGNOSIS — N939 Abnormal uterine and vaginal bleeding, unspecified: Secondary | ICD-10-CM

## 2023-12-01 DIAGNOSIS — N6489 Other specified disorders of breast: Secondary | ICD-10-CM

## 2023-12-01 NOTE — Patient Instructions (Signed)
 It was wonderful to see you today.  Please bring ALL of your medications with you to every visit.   Today we talked about:   I recommend you undergo a mammogram.   You can call to schedule an appointment by calling [phone].   Directions [address] [place], [place] [address]  Please let me know if you have questions. I will send you a letter or call you with results.   I will message you with blood work  CONGRATULATIONS TO YOUR DAUGHTER  I have referred you to Cardiology to further evaluate your concern. If you do not received a phone call about this appointment within 3-4 weeks, please call our office back at [phone]. Margit Dimes coordinates our referrals and can assist you in this.    Please follow up in 3 months   Thank you for choosing [facility].   Please call [phone] with any questions about today's appointment.  Please be sure to schedule follow up at the front  desk before you leave today.   Suzann Daring, MD  Family Medicine

## 2023-12-01 NOTE — Assessment & Plan Note (Signed)
 Near goal and at goal at home.  Home readings 120s to 130s over 80s.  She is on an ARB, calcium channel blocker and thiazide like diuretic.

## 2023-12-01 NOTE — Assessment & Plan Note (Signed)
 Congratulated weight changes A1c today

## 2023-12-01 NOTE — Assessment & Plan Note (Signed)
 CBC and ferritin today.  She is follow-up with gynecology suspect due to fibroids.  Discussed repeat ultrasound today she will get with her gynecologist for this instead

## 2023-12-02 LAB — MICROALBUMIN / CREATININE URINE RATIO
Creatinine, Urine: 87.4 mg/dL
Microalb/Creat Ratio: 174 mg/g{creat} — ABNORMAL HIGH (ref 0–29)
Microalbumin, Urine: 152.4 ug/mL

## 2023-12-04 ENCOUNTER — Encounter: Payer: Self-pay | Admitting: Family Medicine

## 2023-12-07 DIAGNOSIS — Z1151 Encounter for screening for human papillomavirus (HPV): Secondary | ICD-10-CM | POA: Diagnosis not present

## 2023-12-07 DIAGNOSIS — N852 Hypertrophy of uterus: Secondary | ICD-10-CM | POA: Diagnosis not present

## 2023-12-07 DIAGNOSIS — Z124 Encounter for screening for malignant neoplasm of cervix: Secondary | ICD-10-CM | POA: Diagnosis not present

## 2023-12-07 DIAGNOSIS — Z01411 Encounter for gynecological examination (general) (routine) with abnormal findings: Secondary | ICD-10-CM | POA: Diagnosis not present

## 2023-12-07 NOTE — Telephone Encounter (Signed)
Patient returns call to nurse line. She states that she is fine with going ahead with cholesterol medication and would also like to proceed with referral to Cardiology.   She states that her mother saw Dr. Laneta Simmers with [facility].   Will forward to Dr. Manson Passey.   Veronda Prude, RN

## 2023-12-12 LAB — BASIC METABOLIC PANEL
BUN/Creatinine Ratio: 15 (ref 9–23)
BUN: 17 mg/dL (ref 6–24)
CO2: 23 mmol/L (ref 20–29)
Calcium: 9 mg/dL (ref 8.7–10.2)
Chloride: 102 mmol/L (ref 96–106)
Creatinine, Ser: 1.17 mg/dL — ABNORMAL HIGH (ref 0.57–1.00)
Glucose: 105 mg/dL — ABNORMAL HIGH (ref 70–99)
Potassium: 3.8 mmol/L (ref 3.5–5.2)
Sodium: 140 mmol/L (ref 134–144)
eGFR: 58 mL/min/{1.73_m2} — ABNORMAL LOW (ref >59)

## 2023-12-12 LAB — CBC
Hematocrit: 33.9 % — ABNORMAL LOW (ref 34.0–46.6)
Hemoglobin: 10.4 g/dL — ABNORMAL LOW (ref 11.1–15.9)
MCH: 23.7 pg — ABNORMAL LOW (ref 26.6–33.0)
MCHC: 30.7 g/dL — ABNORMAL LOW (ref 31.5–35.7)
MCV: 77 fL — ABNORMAL LOW (ref 79–97)
Platelets: 288 10*3/uL (ref 150–450)
RBC: 4.38 x10E6/uL (ref 3.77–5.28)
RDW: 16.8 % — ABNORMAL HIGH (ref 11.7–15.4)
WBC: 6.2 10*3/uL (ref 3.4–10.8)

## 2023-12-12 LAB — LIPID PANEL
Chol/HDL Ratio: 4.4 {ratio} (ref 0.0–4.4)
Cholesterol, Total: 186 mg/dL (ref 100–199)
HDL: 42 mg/dL (ref >39)
LDL Chol Calc (NIH): 130 mg/dL — ABNORMAL HIGH (ref 0–99)
Triglycerides: 75 mg/dL (ref 0–149)
VLDL Cholesterol Cal: 14 mg/dL (ref 5–40)

## 2023-12-12 LAB — HEMOGLOBIN A1C
Est. average glucose Bld gHb Est-mCnc: 126 mg/dL
Hgb A1c MFr Bld: 6 % — ABNORMAL HIGH (ref 4.8–5.6)

## 2023-12-12 LAB — FERRITIN: Ferritin: 13 ng/mL — ABNORMAL LOW (ref 15–150)

## 2023-12-12 LAB — ALDOSTERONE + RENIN ACTIVITY W/ RATIO
Aldos/Renin Ratio: 2.5 (ref 0.0–30.0)
Aldosterone: 9.5 ng/dL (ref 0.0–30.0)
Renin Activity, Plasma: 3.833 ng/mL/h (ref 0.167–5.380)

## 2023-12-22 DIAGNOSIS — G4733 Obstructive sleep apnea (adult) (pediatric): Secondary | ICD-10-CM | POA: Diagnosis not present

## 2024-01-12 ENCOUNTER — Telehealth: Payer: Self-pay | Admitting: Family Medicine

## 2024-01-12 NOTE — Telephone Encounter (Signed)
 Left voicemail to give the office a call back to schedule Dupixent reapproval appointment.

## 2024-01-19 DIAGNOSIS — G4733 Obstructive sleep apnea (adult) (pediatric): Secondary | ICD-10-CM | POA: Diagnosis not present

## 2024-01-19 NOTE — Progress Notes (Signed)
 Cardiology Office Note:    Date:  01/23/2024   ID:  Jillian Vasquez, DOB 05-15-1976, MRN [id number]  PCP:  Westley Chandler, MD   [facility] Providers Cardiologist:  None     Referring MD: Westley Chandler, MD   Chief Complaint  Patient presents with   Essential hypertension   Family history of aortic dissection   New Patient (Initial Visit)    History of Present Illness:    Jillian Vasquez is a 48 y.o. female is seen at the request of Dr Manson Passey for HTN and family history of aortic dissection. CT chest/abd/pelvis in 2023 showed no aortic pathology. She reports he mother had aortic dissection age 30. Has required 2 surgeries for this. The patient has a history of HTN, prediabetes, HLD, and OSA on CPAP.  She reports BP at home is good -typically 120-130. She is motivated to make lifestyle changes. She denies any chest pain or SOB.   Past Medical History:  Diagnosis Date   Asthma    Chronic sinusitis of both maxillary sinuses    Headache disorder    "from my BP"   Hypercholesteremia    Hypertension    Sleep apnea     Past Surgical History:  Procedure Laterality Date   BARTHOLIN CYST MARSUPIALIZATION N/A 08/29/2014   Procedure: BARTHOLIN CYST MARSUPIALIZATION;  Surgeon: Tereso Newcomer, MD;  Location: [facility];  Service: Gynecology;  Laterality: N/A;   BREAST LUMPECTOMY WITH RADIOACTIVE SEED LOCALIZATION Left 11/17/2021   Procedure: LEFT BREAST LUMPECTOMY WITH RADIOACTIVE SEED LOCALIZATION;  Surgeon: Harriette Bouillon, MD;  Location: [facility];  Service: General;  Laterality: Left;   CESAREAN SECTION  10/24/1998   ETHMOIDECTOMY Bilateral 01/03/2019   Procedure: ETHMOIDECTOMY;  Surgeon: Drema Halon, MD;  Location: [facility];  Service: ENT;  Laterality: Bilateral;   LIVER BIOPSY     MAXILLARY ANTROSTOMY Bilateral 01/03/2019   Procedure: MAXILLARY ANTROSTOMY WITH REMOVAL OF TISSUE;  Surgeon: Drema Halon, MD;  Location: [facility];  Service: ENT;  Laterality: Bilateral;   NASAL SINUS SURGERY Bilateral 01/03/2019   Procedure: ENDOSCOPIC SINUS SURGERY WITH SHENOIDOTOMY;  Surgeon: Drema Halon, MD;  Location: [facility];  Service: ENT;  Laterality: Bilateral;   SINUS ENDO WITH FUSION Bilateral 01/03/2019   Procedure: SINUS ENDOSCOPY WITH FUSION NAVIGATION;  Surgeon: Drema Halon, MD;  Location: [facility];  Service: ENT;  Laterality: Bilateral;   TUBAL LIGATION  10/24/1998   TURBINATE REDUCTION Bilateral 01/03/2019   Procedure: TURBINATE REDUCTION;  Surgeon: Drema Halon, MD;  Location: [facility];  Service: ENT;  Laterality: Bilateral;    Current Medications: Current Meds  Medication Sig   amLODipine (NORVASC) 10 MG tablet Take 1 tablet (10 mg total) by mouth daily.   DUPIXENT 300 MG/2ML prefilled syringe INJECT 300 MG UNDER THE SKIN EVERY 2 WEEKS (MAINTENANCE DOSE)   losartan-hydrochlorothiazide (HYZAAR) 50-12.5 MG tablet Take 1 tablet by mouth daily.   Current Facility-Administered Medications for the 01/23/24 encounter (Office Visit) with [place], Korra Christine M, MD  Medication   dupilumab (DUPIXENT) prefilled syringe 300 mg     Allergies:   Lisinopril   Social History   Socioeconomic History   Marital status: Married    Spouse name: Not on file   Number of children: 3   Years of education: Not on file   Highest education level: Not on file  Occupational History  Not on file  Tobacco Use   Smoking status: Never   Smokeless tobacco: Never  Vaping Use   Vaping status: Never Used  Substance and Sexual Activity   Alcohol use: Yes    Comment: rarely   Drug use: No   Sexual activity: Yes    Birth control/protection: Surgical  Other Topics Concern   Not on file  Social History Narrative   Works at [employer] Strain: Not on file  Food Insecurity:  Not on file  Transportation Needs: Not on file  Physical Activity: Not on file  Stress: Not on file  Social Connections: Unknown (10/29/2022)   Received from [employer], [facility]   Social Network    Social Network: Not on file     Family History: The patient's family history includes Aortic dissection in her mother; Breast cancer in her cousin, maternal aunt, and maternal grandmother; Heart failure in her father; Hypertension in her father, maternal aunt, maternal uncle, and mother; Valvular heart disease in her mother. There is no history of Colon cancer, Esophageal cancer, or Stomach cancer.  ROS:   Please see the history of present illness.     All other systems reviewed and are negative.  EKGs/Labs/Other Studies Reviewed:    The following studies were reviewed today:  EKG Interpretation Date/Time:  Tuesday January 23 2024 11:28:17 EDT Ventricular Rate:  72 PR Interval:  146 QRS Duration:  76 QT Interval:  426 QTC Calculation: 466 R Axis:   25  Text Interpretation: Normal sinus rhythm Possible Left atrial enlargement Cannot rule out Anterior infarct , age undetermined When compared with ECG of 15-Apr-2022 01:41, No significant change was found Confirmed by [place], Pricella Gaugh [phone]) on 01/23/2024 11:33:14 AM    Recent Labs: 12/01/2023: BUN 17; Creatinine, Ser 1.17; Hemoglobin 10.4; Platelets 288; Potassium 3.8; Sodium 140  Recent Lipid Panel    Component Value Date/Time   CHOL 186 12/01/2023 0931   TRIG 75 12/01/2023 0931   HDL 42 12/01/2023 0931   CHOLHDL 4.4 12/01/2023 0931   CHOLHDL 3.4 03/07/2012 0500   VLDL 9 03/07/2012 0500   LDLCALC 130 (H) 12/01/2023 0931     Risk Assessment/Calculations:       Physical Exam:    VS:  BP (!) 140/84 (BP Location: Left Arm, Patient Position: Sitting, Cuff Size: Large)   Pulse 73   Resp 16   Ht 5\' 1"  (1.549 m)   Wt 198 lb 9.6 oz (90.1 kg)   SpO2 92%   BMI 37.53 kg/m     Wt Readings from Last 3 Encounters:  01/23/24 198  lb 9.6 oz (90.1 kg)  12/01/23 198 lb (89.8 kg)  03/17/23 197 lb 12.8 oz (89.7 kg)     GEN:  Well nourished, well developed in no acute distress HEENT: Normal NECK: No JVD; No carotid bruits LYMPHATICS: No lymphadenopathy CARDIAC: RRR, no murmurs, rubs, gallops RESPIRATORY:  Clear to auscultation without rales, wheezing or rhonchi  ABDOMEN: Soft, non-tender, non-distended MUSCULOSKELETAL:  No edema; No deformity  SKIN: Warm and dry NEUROLOGIC:  Alert and oriented x 3 PSYCHIATRIC:  Normal affect   ASSESSMENT:    1. Essential hypertension   2. Hypercholesterolemia   3. Prediabetes    PLAN:    In order of problems listed above:  HTN - according to home BP readings this is well controlled HLD. Mildly elevated cholesterol. Stressed importance of lifestyle modification with weight loss, avoidance of sweets  and processed foods, and regular aerobic exercise. I reviewed her CT from 2023. No aortic pathology. Also no coronary calcification or aortic calcification. For this reason lipid lowering therapy can be deferred for now but stressed lifestyle changes. Prediabetes            Medication Adjustments/Labs and Tests Ordered: Current medicines are reviewed at length with the patient today.  Concerns regarding medicines are outlined above.  Orders Placed This Encounter  Procedures   EKG 12-Lead   No orders of the defined types were placed in this encounter.   Patient Instructions  Medication Instructions:  Continue same medications  Lab Work: None ordered  Testing/Procedures: None ordered  Follow-Up: At [facility], you and your health needs are our priority.  As part of our continuing mission to provide you with exceptional heart care, our providers are all part of one team.  This team includes your primary Cardiologist (physician) and Advanced Practice Providers or APPs (Physician Assistants and Nurse Practitioners) who all work together to provide you with  the care you need, when you need it.  Your next appointment:  As Needed    Provider:  Dr.Cadynce Garrette   We recommend signing up for the patient portal called "MyChart".  Sign up information is provided on this After Visit Summary.  MyChart is used to connect with patients for Virtual Visits (Telemedicine).  Patients are able to view lab/test results, encounter notes, upcoming appointments, etc.  Non-urgent messages can be sent to your provider as well.   To learn more about what you can do with MyChart, go to [email or web address].         1st Floor: - Lobby - Registration  - Pharmacy  - Lab - Cafe  2nd Floor: - PV Lab - Diagnostic Testing (echo, CT, nuclear med)  3rd Floor: - Vacant  4th Floor: - TCTS (cardiothoracic surgery) - AFib Clinic - Structural Heart Clinic - Vascular Surgery  - Vascular Ultrasound  5th Floor: - HeartCare Cardiology (general and EP) - Clinical Pharmacy for coumadin, hypertension, lipid, weight-loss medications, and med management appointments    Valet parking services will be available as well.      Signed, Alisson Rozell [place], MD  01/23/2024 12:03 PM    [facility]

## 2024-01-22 ENCOUNTER — Encounter (HOSPITAL_BASED_OUTPATIENT_CLINIC_OR_DEPARTMENT_OTHER): Payer: Self-pay

## 2024-01-22 ENCOUNTER — Other Ambulatory Visit: Payer: Self-pay | Admitting: *Deleted

## 2024-01-22 DIAGNOSIS — I10 Essential (primary) hypertension: Secondary | ICD-10-CM

## 2024-01-22 DIAGNOSIS — D259 Leiomyoma of uterus, unspecified: Secondary | ICD-10-CM | POA: Diagnosis not present

## 2024-01-22 MED ORDER — LOSARTAN POTASSIUM-HCTZ 50-12.5 MG PO TABS: 1.0000 | ORAL_TABLET | Freq: Every day | ORAL | 3 refills | Status: AC

## 2024-01-23 ENCOUNTER — Ambulatory Visit: Attending: Cardiology | Admitting: Cardiology

## 2024-01-23 ENCOUNTER — Encounter: Payer: Self-pay | Admitting: Cardiology

## 2024-01-23 VITALS — BP 140/84 | HR 73 | Resp 16 | Ht 61.0 in | Wt 198.6 lb

## 2024-01-23 DIAGNOSIS — E7800 Pure hypercholesterolemia, unspecified: Secondary | ICD-10-CM | POA: Diagnosis not present

## 2024-01-23 DIAGNOSIS — I10 Essential (primary) hypertension: Secondary | ICD-10-CM

## 2024-01-23 DIAGNOSIS — R7303 Prediabetes: Secondary | ICD-10-CM | POA: Diagnosis not present

## 2024-01-23 NOTE — Patient Instructions (Signed)
 Medication Instructions:  Continue same medications  Lab Work: None ordered  Testing/Procedures: None ordered  Follow-Up: At [facility], you and your health needs are our priority.  As part of our continuing mission to provide you with exceptional heart care, our providers are all part of one team.  This team includes your primary Cardiologist (physician) and Advanced Practice Providers or APPs (Physician Assistants and Nurse Practitioners) who all work together to provide you with the care you need, when you need it.  Your next appointment:  As Needed    Provider:  Dr.Jordan   We recommend signing up for the patient portal called "MyChart".  Sign up information is provided on this After Visit Summary.  MyChart is used to connect with patients for Virtual Visits (Telemedicine).  Patients are able to view lab/test results, encounter notes, upcoming appointments, etc.  Non-urgent messages can be sent to your provider as well.   To learn more about what you can do with MyChart, go to [email or web address].        1st Floor: - Lobby - Registration  - Pharmacy  - Lab - Cafe  2nd Floor: - PV Lab - Diagnostic Testing (echo, CT, nuclear med)  3rd Floor: - Vacant  4th Floor: - TCTS (cardiothoracic surgery) - AFib Clinic - Structural Heart Clinic - Vascular Surgery  - Vascular Ultrasound  5th Floor: - HeartCare Cardiology (general and EP) - Clinical Pharmacy for coumadin, hypertension, lipid, weight-loss medications, and med management appointments    Valet parking services will be available as well.

## 2024-01-24 ENCOUNTER — Other Ambulatory Visit: Payer: Self-pay | Admitting: Family Medicine

## 2024-01-24 DIAGNOSIS — E65 Localized adiposity: Secondary | ICD-10-CM

## 2024-01-24 DIAGNOSIS — Z8249 Family history of ischemic heart disease and other diseases of the circulatory system: Secondary | ICD-10-CM

## 2024-01-24 DIAGNOSIS — N939 Abnormal uterine and vaginal bleeding, unspecified: Secondary | ICD-10-CM

## 2024-01-24 DIAGNOSIS — N6489 Other specified disorders of breast: Secondary | ICD-10-CM

## 2024-01-24 DIAGNOSIS — E663 Overweight: Secondary | ICD-10-CM

## 2024-01-24 DIAGNOSIS — I10 Essential (primary) hypertension: Secondary | ICD-10-CM

## 2024-01-24 NOTE — Patient Instructions (Signed)
Asthma Continue albuterol 2 puffs once every 4 hours as needed for cough or wheeze You may use albuterol 2 puffs 5 to 15 minutes before activity to decrease cough or wheeze For asthma flare, begin Symbicort 80-2 puffs twice a day  Allergic rhinitis Continue allergen avoidance measures directed toward grass pollen, mold, and cat as listed below Continue cetirizine 10 mg once a day as needed for runny nose or itch Continue Flonase 2 sprays in each nostril once a day as needed for stuffy nose Consider saline nasal rinses as needed for nasal symptoms. Use this before any medicated nasal sprays for best result  Nasal polyposis Continue Flonase and nasal rinses as listed above Continue Dupixent 300 mg injections once every 2 weeks per protocol  Call the clinic if this treatment plan is not working well for you.  Follow up in 6 months or sooner if needed.  Reducing Pollen Exposure The American Academy of Allergy, Asthma and Immunology suggests the following steps to reduce your exposure to pollen during allergy seasons. Do not hang sheets or clothing out to dry; pollen may collect on these items. Do not mow lawns or spend time around freshly cut grass; mowing stirs up pollen. Keep windows closed at night.  Keep car windows closed while driving. Minimize morning activities outdoors, a time when pollen counts are usually at their highest. Stay indoors as much as possible when pollen counts or humidity is high and on windy days when pollen tends to remain in the air longer. Use air conditioning when possible.  Many air conditioners have filters that trap the pollen spores. Use a HEPA room air filter to remove pollen form the indoor air you breathe.  Control of Mold Allergen Mold and fungi can grow on a variety of surfaces provided certain temperature and moisture conditions exist.  Outdoor molds grow on plants, decaying vegetation and soil.  The major outdoor mold, Alternaria and Cladosporium,  are found in very high numbers during hot and dry conditions.  Generally, a late Summer - Fall peak is seen for common outdoor fungal spores.  Rain will temporarily lower outdoor mold spore count, but counts rise rapidly when the rainy period ends.  The most important indoor molds are Aspergillus and Penicillium.  Dark, humid and poorly ventilated basements are ideal sites for mold growth.  The next most common sites of mold growth are the bathroom and the kitchen.  Outdoor [employer] Use air conditioning and keep windows closed Avoid exposure to decaying vegetation. Avoid leaf raking. Avoid grain handling. Consider wearing a face mask if working in moldy areas.  Indoor Mold Control Maintain humidity below 50%. Clean washable surfaces with 5% bleach solution. Remove sources e.g. Contaminated carpets.  Control of Dog or Cat Allergen Avoidance is the best way to manage a dog or cat allergy. If you have a dog or cat and are allergic to dog or cats, consider removing the dog or cat from the home. If you have a dog or cat but don't want to find it a new home, or if your family wants a pet even though someone in the household is allergic, here are some strategies that may help keep symptoms at bay:  Keep the pet out of your bedroom and restrict it to only a few rooms. Be advised that keeping the dog or cat in only one room will not limit the allergens to that room. Don't pet, hug or kiss the dog or cat; if you do, wash your  hands with soap and water. High-efficiency particulate air (HEPA) cleaners run continuously in a bedroom or living room can reduce allergen levels over time. Regular use of a high-efficiency vacuum cleaner or a central vacuum can reduce allergen levels. Giving your dog or cat a bath at least once a week can reduce airborne allergen.

## 2024-01-24 NOTE — Progress Notes (Unsigned)
   [address] [place] [address] Dept: [phone]  FOLLOW UP NOTE  Patient ID: Jillian Vasquez, female    DOB: 25-Apr-1976  Age: 48 y.o. MRN: [id number] Date of Office Visit: 01/25/2024  Assessment  Chief Complaint: No chief complaint on file.  HPI Jillian Vasquez is a 48 year old female who presents to the clinic for a follow-up visit.  She was last seen in this clinic on 02/13/2023 by Thermon Leyland, FNP, for evaluation of asthma, allergic rhinitis, and nasal polyposis on Dupixent.    Her last environmental allergy testing was on 07/15/2021 and was positive to grass pollen, mold, and cat.  Discussed the use of AI scribe software for clinical note transcription with the patient, who gave verbal consent to proceed.  History of Present Illness      Drug Allergies:  Allergies  Allergen Reactions   Lisinopril Cough    Physical Exam: There were no vitals taken for this visit.   Physical Exam  Diagnostics:    Assessment and Plan: No diagnosis found.  No orders of the defined types were placed in this encounter.   There are no Patient Instructions on file for this visit.  No follow-ups on file.    Thank you for the opportunity to care for this patient.  Please do not hesitate to contact me with questions.  Thermon Leyland, FNP [facility]

## 2024-01-25 ENCOUNTER — Other Ambulatory Visit: Payer: Self-pay

## 2024-01-25 ENCOUNTER — Ambulatory Visit: Admitting: Family Medicine

## 2024-01-25 ENCOUNTER — Encounter: Payer: Self-pay | Admitting: Family Medicine

## 2024-01-25 VITALS — BP 136/86 | HR 83 | Temp 98.7°F | Resp 16 | Ht 61.0 in | Wt 197.9 lb

## 2024-01-25 DIAGNOSIS — J3089 Other allergic rhinitis: Secondary | ICD-10-CM

## 2024-01-25 DIAGNOSIS — J4520 Mild intermittent asthma, uncomplicated: Secondary | ICD-10-CM

## 2024-01-25 DIAGNOSIS — J339 Nasal polyp, unspecified: Secondary | ICD-10-CM

## 2024-01-25 DIAGNOSIS — J302 Other seasonal allergic rhinitis: Secondary | ICD-10-CM

## 2024-01-25 MED ORDER — BUDESONIDE-FORMOTEROL FUMARATE 160-4.5 MCG/ACT IN AERO: 2.0000 | INHALATION_SPRAY | Freq: Two times a day (BID) | RESPIRATORY_TRACT | 1 refills | Status: AC

## 2024-01-31 ENCOUNTER — Ambulatory Visit
Admission: RE | Admit: 2024-01-31 | Discharge: 2024-01-31 | Disposition: A | Discharge status: Home / Self Care | Payer: BC Managed Care – PPO | Source: Ambulatory Visit | Attending: Family Medicine

## 2024-01-31 ENCOUNTER — Ambulatory Visit
Admission: RE | Admit: 2024-01-31 | Discharge: 2024-01-31 | Disposition: A | Discharge status: Home / Self Care | Source: Ambulatory Visit | Attending: Family Medicine

## 2024-01-31 ENCOUNTER — Other Ambulatory Visit: Payer: Self-pay | Admitting: Family Medicine

## 2024-01-31 DIAGNOSIS — E65 Localized adiposity: Secondary | ICD-10-CM

## 2024-01-31 DIAGNOSIS — N6332 Unspecified lump in axillary tail of the left breast: Secondary | ICD-10-CM | POA: Diagnosis not present

## 2024-02-01 ENCOUNTER — Encounter: Payer: Self-pay | Admitting: Family Medicine

## 2024-02-20 DIAGNOSIS — N939 Abnormal uterine and vaginal bleeding, unspecified: Secondary | ICD-10-CM | POA: Diagnosis not present

## 2024-02-21 ENCOUNTER — Telehealth: Payer: Self-pay

## 2024-02-21 NOTE — Telephone Encounter (Signed)
 Patient calls nurse line requesting letter from PCP for surgical clearance for hysterectomy.   Patient reports that she had cardiology evaluation and no concerns were identified.   OBGYN- Dr. Alain Howard, is requesting letter from PCP prior to scheduling procedure.   Will forward request to PCP.   Elsie Halo, RN

## 2024-02-22 NOTE — Telephone Encounter (Signed)
 Called patient. Discussed. Agreeable to 415 PM visit. Front team-can you please message through MyChart to schedule? Would like Monday 415 visit if possible--if not available, please work with her to find a time/date.   Needs UACR and repeat lipid panel at visit. Please also go through routine pre-op questions.  Otho Blitz, MD  Family Medicine Teaching Service

## 2024-02-22 NOTE — Telephone Encounter (Signed)
 Patient will need a visit--can be with any physician. We have additional pre-op questions that were not asked at Cardiology. Also needs UACR repeated.   Please schedule with any physician at her convenience.  Otho Blitz, MD  Family Medicine Teaching Service

## 2024-02-22 NOTE — Telephone Encounter (Signed)
 Pt informed but wasn't happy that she has to make an appt. She wants dr brown to call her. She is declining an appt at this time. Pa Tennant Maynard Spears, CMA

## 2024-02-26 ENCOUNTER — Ambulatory Visit: Admitting: Family Medicine

## 2024-03-11 NOTE — Progress Notes (Signed)
    SUBJECTIVE:   CHIEF COMPLAINT / HPI:   Hypertension: - Medications: Amlodipine  10 mg daily, losartan -HCTZ 50-12.5 daily - Compliance: Yes - took this AM - Checking BP at home: 130/70s - Denies any SOB, CP, vision changes, LE edema, medication SEs, or symptoms of hypotension - Diet: Doing more oatmeal and more fruits and vegs   Surgical Pre-operative Evaluation:  Procedure: Total hysterectomy Procedural risk: Intermediate  Anesthesia: General  Surgical History:  The patient denies any complication with anesthesia, bleeding, or post-operative confusion, nausea, or vomiting in prior surgical interventions. The patient denies any history of spinal surgeries. H/o C/S with BTL in 2000.  Family History: The patient denies any family history of complications with anesthesia. Mom has VTE but it was provoked.  Social History: No tobacco use or use.  Screening for OSA: Sleep study confirmed OSA, wears CPAP  Medication Adjustments: - Generally hold ACEI/ARB, and diuretics on day of surgery - Generally continute steroids, amlodipine , BB, clonidine  (risk of withdrawal associated with harm)  Risk Stratification Tool: [place] Gillis Calculator: 0% risk of MI or cardiac event, intraoperatively or up to 30 days post-op  Functional Capacity:  > 4 METS  PERTINENT  PMH / PSH: HTN, asthma, OSA, allergic rhinitis, AUB  OBJECTIVE:   BP (!) 145/96   Pulse 74   Ht 5\' 1"  (1.549 m)   Wt 197 lb 8 oz (89.6 kg)   LMP 03/03/2024   SpO2 100%   BMI 37.32 kg/m    General: NAD, pleasant, able to participate in exam Cardiac: RRR, no murmurs. Respiratory: CTAB, normal effort, No wheezes, rales or rhonchi Abdomen: Bowel sounds present, nontender, nondistended Extremities: no edema or cyanosis. Skin: warm and dry, no rashes noted Neuro: alert, no obvious focal deficits Psych: Normal affect and mood  ASSESSMENT/PLAN:   Assessment & Plan Abnormal uterine bleeding Planning for hysterectomy on  04/11/2024 completed preop evaluation patient is low risk for an intermediate risk surgery.  Given documentation to provide to OB/GYN. Essential hypertension 145/76 upon repeat but home readings have been well-controlled.  Will not adjust regimen today, follow-up with PCP for further evaluation. Proteinuria, unspecified type Moderately increased in 11/2023, repeat ACR Hyperlipidemia, unspecified hyperlipidemia type LDL 130, PREVENT score 4.69% 10-year risk.  Seen by cardiology who recommended against statin initiation.  Patient would like to continue with lifestyle management, repeat LDL today. Prediabetes A1c 5.8, improved from 6 in 11/2023.  Congratulated on continued diet and exercise changes.    Dr. Jonne Netters, DO [facility]

## 2024-03-12 ENCOUNTER — Ambulatory Visit: Admitting: Family Medicine

## 2024-03-12 VITALS — BP 145/96 | HR 74 | Ht 61.0 in | Wt 197.5 lb

## 2024-03-12 DIAGNOSIS — R809 Proteinuria, unspecified: Secondary | ICD-10-CM

## 2024-03-12 DIAGNOSIS — E785 Hyperlipidemia, unspecified: Secondary | ICD-10-CM | POA: Diagnosis not present

## 2024-03-12 DIAGNOSIS — N939 Abnormal uterine and vaginal bleeding, unspecified: Secondary | ICD-10-CM | POA: Diagnosis not present

## 2024-03-12 DIAGNOSIS — R7303 Prediabetes: Secondary | ICD-10-CM | POA: Diagnosis not present

## 2024-03-12 DIAGNOSIS — I10 Essential (primary) hypertension: Secondary | ICD-10-CM

## 2024-03-12 LAB — POCT GLYCOSYLATED HEMOGLOBIN (HGB A1C): HbA1c, POC (prediabetic range): 5.8 % (ref 5.7–6.4)

## 2024-03-12 NOTE — Patient Instructions (Signed)
 It was wonderful to see you today! Thank you for choosing [facility].   Please bring ALL of your medications with you to every visit.   Today we talked about:  You are low risk for surgery to have your uterus removed.  As discussed please make sure you move around after surgery to prevent formation of blood clots.  Will recheck some labs today and I will follow-up with those results but continue to do your lifestyle management with dietary changes. As we discussed please do not take your losartan  combo blood pressure pill the morning of your surgery. You can continue to take the Amlodipine  and other medications the day of your surgery.  Please follow up in 3 months with PCP   We are checking some labs today. If they are abnormal, I will call you. If they are normal, I will send you a MyChart message (if it is active) or a letter in the mail. If you do not hear about your labs in the next 2 weeks, please call the office.  Call the clinic at [phone] if your symptoms worsen or you have any concerns.  Please be sure to schedule follow up at the front desk before you leave today.   Jonne Netters, DO Family Medicine

## 2024-03-12 NOTE — Assessment & Plan Note (Signed)
 145/76 upon repeat but home readings have been well-controlled.  Will not adjust regimen today, follow-up with PCP for further evaluation.

## 2024-03-12 NOTE — Assessment & Plan Note (Signed)
 Planning for hysterectomy on 04/11/2024 completed preop evaluation patient is low risk for an intermediate risk surgery.  Given documentation to provide to OB/GYN.

## 2024-03-13 ENCOUNTER — Ambulatory Visit: Payer: Self-pay | Admitting: Family Medicine

## 2024-03-13 LAB — LDL CHOLESTEROL, DIRECT: LDL Direct: 95 mg/dL (ref 0–99)

## 2024-03-14 LAB — MICROALBUMIN / CREATININE URINE RATIO
Creatinine, Urine: 81.2 mg/dL
Microalb/Creat Ratio: 153 mg/g{creat} — ABNORMAL HIGH (ref 0–29)
Microalbumin, Urine: 124.3 ug/mL

## 2024-04-03 ENCOUNTER — Encounter (HOSPITAL_COMMUNITY): Payer: Self-pay | Admitting: Obstetrics and Gynecology

## 2024-04-08 ENCOUNTER — Encounter (HOSPITAL_COMMUNITY): Payer: Self-pay

## 2024-04-08 ENCOUNTER — Encounter (HOSPITAL_COMMUNITY): Payer: Self-pay | Admitting: Obstetrics and Gynecology

## 2024-04-08 NOTE — Progress Notes (Signed)
 Spoke w/ via phone for pre-op interview--- pt Lab needs dos----   urine preg      Lab results------ lab appt 04-10-2024 @ 1300 getting CBC/ BMP/ T&sd Current EKG in epic/ chart COVID test -----patient states asymptomatic no test needed Arrive at ------- 0530 on 04-11-2024 NPO after MN w/ exception sips of water w/ meds Pre-Surgery Ensure or G2: n/a  Med rec completed Medications to take morning of surgery ----- norvasc /  symbicort  inhaler Diabetic medication ----- n/a  GLP1 agonist last dose: n/a GLP1 instructions:  Patient instructed no nail polish to be worn day of surgery Patient instructed to bring photo id and insurance card day of surgery Patient aware to have Driver (ride ) / caregiver    for 24 hours after surgery - husband, Jillian Vasquez Patient Special Instructions ----- will pick up bag w/ soap and written instructions at lab appt Asked to bring rescue inhaler and cpap/ mask/ tubing dos Pre-Op special Instructions ----- n/a  Patient verbalized understanding of instructions that were given at this phone interview. Patient denies chest pain, sob, fever, cough at the interview.    Anesthesia Review:  HTN;  mild intermittent asthma (last used rescue inhaler >1 yr);  severe OSA (uses cpap nightly);  pre-diabetes   PCP: Dr C. Bevin [facility]  Jillian Sic NP (lov 01-25-2024  Chest x-ray :  & CTA  04-15-2022 EKG : 01-23-2024 Echo : no Stress test: no Cardiac Cath : no  Activity level:   denies sob w/ any acitivty Sleep Study/ CPAP :  yes 12-25-2020  Blood Thinner/ Instructions /Last Dose:  no ASA / Instructions/ Last Dose : no

## 2024-04-08 NOTE — Pre-Procedure Instructions (Signed)
 Surgical Instructions   Your procedure is scheduled on :  Thursday,  04-11-2024. Report to [facility] Main Entrance A at 5:30  A.M., then check in with the Admitting office. Any questions or running late day of surgery: call [phone]  Questions prior to your surgery date: call [phone], Monday-Friday, 8am-4pm. If you experience any cold or flu symptoms such as cough, fever, chills, shortness of breath, etc. between now and your scheduled surgery, please notify surgeon office.    Remember:  Do not eat after any food and do not drink any liquids midnight the night before your surgery.  This includes no water,  candy,  gum,  and  mints.    Take these medicines the morning of surgery with A SIPS OF WATER :   Amlodipine  (norvasc ) Budesonide -formotersol (symbicort ) inhaler  May take these medicines IF NEEDED: Albuterol  (ventolin ) inhaler  ~~Please bring your albuterol  inhaler with you day of surgery   One week prior to surgery, STOP taking any Aspirin  (unless otherwise instructed by your surgeon) Aleve, Naproxen, Ibuprofen , Motrin , Advil , Goody's, BC's, all herbal medications, fish oil, and non-prescription vitamins.                     Do NOT Smoke (Tobacco/Vaping) and Do Not drink alcohol for 24 hours prior to your procedure.  ~~ Please bring your CPAP/ Mask/ Tubing with you day of surgery possible anesthesia my have nurse use during recovery also if you stay overnight you will be ask to use.    You will be asked to remove any contacts, glasses, piercing's, hearing aid's, dentures/partials prior to surgery. Please bring cases for these items if needed.    Patients discharged the day of surgery will not be allowed to drive home, and someone needs to stay with them for 24 hours.  SURGICAL WAITING ROOM VISITATION Patients may have no more than 2 support people in the waiting area - these visitors may rotate.   Pre-op nurse will coordinate an appropriate time for 1 ADULT  support person, who may not rotate, to accompany patient in pre-op.  Children under the age of 39 must have an adult with them who is not the patient and must remain in the main waiting area with an adult.  If the patient needs to stay at the hospital during part of their recovery, the visitor guidelines for inpatient rooms apply.  Please refer to the [facility] website for the visitor guidelines for any additional information.   If you received a COVID test during your pre-op visit  it is requested that you wear a mask when out in public, stay away from anyone that may not be feeling well and notify your surgeon if you develop symptoms. If you have been in contact with anyone that has tested positive in the last 10 days please notify you surgeon.      Pre-operative CHG Bathing Instructions   You can play a key role in reducing the risk of infection after surgery. Your skin needs to be as free of germs as possible. You can reduce the number of germs on your skin by washing with CHG (chlorhexidine  gluconate) soap before surgery. CHG is an antiseptic soap that kills germs and continues to kill germs even after washing.   DO NOT use if you have an allergy to chlorhexidine /CHG or antibacterial soaps. If your skin becomes reddened or irritated, stop using the CHG and notify Pre-op day of surgery.  ~~Please get dial soap or  other antibacterial soap (no scent) and shower following the instructions below.             TAKE A SHOWER THE NIGHT BEFORE SURGERY AND THE DAY OF SURGERY    Please keep in mind the following:  DO NOT shave, including legs and underarms, 48 hours prior to surgery.   You may shave your face before/day of surgery.  Place clean sheets on your bed the night before surgery Use a clean washcloth (not used since being washed) for each shower. DO NOT sleep with pet's night before surgery.  CHG Shower Instructions:  Wash your face and private area with normal soap. If you choose  to wash your hair, wash first with your normal shampoo.  After you use shampoo/soap, rinse your hair and body thoroughly to remove shampoo/soap residue.  Turn the water OFF and apply half the bottle of CHG soap to a CLEAN washcloth.  Apply CHG soap ONLY FROM YOUR NECK DOWN TO YOUR TOES (washing for 3-5 minutes)  DO NOT use CHG soap on face, private areas, open wounds, or sores.  Pay special attention to the area where your surgery is being performed.  If you are having back surgery, having someone wash your back for you may be helpful. Wait 2 minutes after CHG soap is applied, then you may rinse off the CHG soap.  Pat dry with a clean towel  Put on clean pajamas    Additional instructions for the day of surgery: DO NOT APPLY any lotions,  powder,  oils,  deodorants (may use underarm deodorant) , cologne/  perfumes  or makeup Do not wear jewelry / piercing's/ metal/  permanent jewelry must be removed prior to arrival day of surgery.  (No plastic piercing) Do not wear nail polish, gel polish, artificial nails, or any other type of covering on natural finger nails (toe nails are okay) Do not bring valuables to the hospital. [facility] is not responsible for valuables/personal belongings. Put on clean/comfortable clothes.  Please brush your teeth.  Ask your nurse before applying any prescription medications to the skin.

## 2024-04-10 ENCOUNTER — Encounter (HOSPITAL_COMMUNITY)
Admission: RE | Admit: 2024-04-10 | Discharge: 2024-04-10 | Disposition: A | Discharge status: Home / Self Care | Source: Ambulatory Visit | Attending: Obstetrics and Gynecology | Admitting: Obstetrics and Gynecology

## 2024-04-10 DIAGNOSIS — Z01812 Encounter for preprocedural laboratory examination: Secondary | ICD-10-CM | POA: Diagnosis not present

## 2024-04-10 DIAGNOSIS — Z01818 Encounter for other preprocedural examination: Secondary | ICD-10-CM

## 2024-04-10 LAB — CBC
HCT: 31.6 % — ABNORMAL LOW (ref 36.0–46.0)
Hemoglobin: 9.5 g/dL — ABNORMAL LOW (ref 12.0–15.0)
MCH: 23.3 pg — ABNORMAL LOW (ref 26.0–34.0)
MCHC: 30.1 g/dL (ref 30.0–36.0)
MCV: 77.6 fL — ABNORMAL LOW (ref 80.0–100.0)
Platelets: 353 10*3/uL (ref 150–400)
RBC: 4.07 MIL/uL (ref 3.87–5.11)
RDW: 15.9 % — ABNORMAL HIGH (ref 11.5–15.5)
WBC: 8.1 10*3/uL (ref 4.0–10.5)
nRBC: 0 % (ref 0.0–0.2)

## 2024-04-10 LAB — TYPE AND SCREEN
ABO/RH(D): A POS
Antibody Screen: NEGATIVE

## 2024-04-10 LAB — BASIC METABOLIC PANEL WITH GFR
Anion gap: 9 (ref 5–15)
BUN: 11 mg/dL (ref 6–20)
CO2: 25 mmol/L (ref 22–32)
Calcium: 9 mg/dL (ref 8.9–10.3)
Chloride: 103 mmol/L (ref 98–111)
Creatinine, Ser: 1.05 mg/dL — ABNORMAL HIGH (ref 0.44–1.00)
GFR, Estimated: >60 mL/min (ref >60)
Glucose, Bld: 101 mg/dL — ABNORMAL HIGH (ref 70–99)
Potassium: 3.3 mmol/L — ABNORMAL LOW (ref 3.5–5.1)
Sodium: 137 mmol/L (ref 135–145)

## 2024-04-10 NOTE — H&P (Signed)
 Jillian Vasquez is an 48 y.o. female Presenting for scheduled surgery  Pertinent Gynecological History: Menses: AUB-HMB-IMB Bleeding: dysfunctional uterine bleeding Contraception: condoms DES exposure: denies Blood transfusions: none Sexually transmitted diseases: no past history Previous GYN Procedures: none  Last mammogram: normal Date: 01/23/24 Last pap: normal Date: 12/07/23 OB History: G2, P1103 (csx then VBAC/csx for TIUP)   Menstrual History: Menarche age: 39 Patient's last menstrual period was 03/24/2024 (exact date).    Past Medical History:  Diagnosis Date   Abnormal uterine bleeding (AUB)    Environmental allergies    Family history of aortic dissection    mother-- age 36   (pt had cardiology evaluation by dr p. [place] office note in epic 01-23-2024)   Hyperlipidemia    Hypertension    Mild intermittent asthma    followed by allergy/ asthma center--- Marinus Sic NP   Nasal polyposis    OA (ocular albinism) (HCC)    OSA on CPAP    (04-08-2024  pt stated uses cpap nightly)  followed by dr c. young--- sleep study in epic 12-25-2020  severe OSA   Pre-diabetes    Proteinuria, unspecified type    Seasonal and perennial allergic rhinitis    Uterine leiomyoma    Wears glasses     Past Surgical History:  Procedure Laterality Date   BARTHOLIN CYST MARSUPIALIZATION N/A 08/29/2014   Procedure: BARTHOLIN CYST MARSUPIALIZATION;  Surgeon: Julianne Octave, MD;  Location: [facility];  Service: Gynecology;  Laterality: N/A;   BREAST LUMPECTOMY WITH RADIOACTIVE SEED LOCALIZATION Left 11/17/2021   Procedure: LEFT BREAST LUMPECTOMY WITH RADIOACTIVE SEED LOCALIZATION;  Surgeon: Sim Dryer, MD;  Location: [facility];  Service: General;  Laterality: Left;   CESAREAN SECTION WITH BILATERAL TUBAL LIGATION  2000   COLONOSCOPY WITH PROPOFOL   04/23/2021   dr Willy Harvest   ETHMOIDECTOMY Bilateral 01/03/2019   Procedure: ETHMOIDECTOMY;  Surgeon: Prescott Brodie,  MD;  Location: [facility];  Service: ENT;  Laterality: Bilateral;   LIVER BIOPSY     MAXILLARY ANTROSTOMY Bilateral 01/03/2019   Procedure: MAXILLARY ANTROSTOMY WITH REMOVAL OF TISSUE;  Surgeon: Prescott Brodie, MD;  Location: [facility];  Service: ENT;  Laterality: Bilateral;   NASAL SINUS SURGERY Bilateral 01/03/2019   Procedure: ENDOSCOPIC SINUS SURGERY WITH SHENOIDOTOMY;  Surgeon: Prescott Brodie, MD;  Location: [facility];  Service: ENT;  Laterality: Bilateral;   SINUS ENDO WITH FUSION Bilateral 01/03/2019   Procedure: SINUS ENDOSCOPY WITH FUSION NAVIGATION;  Surgeon: Prescott Brodie, MD;  Location: [facility];  Service: ENT;  Laterality: Bilateral;   TURBINATE REDUCTION Bilateral 01/03/2019   Procedure: TURBINATE REDUCTION;  Surgeon: Prescott Brodie, MD;  Location: [facility];  Service: ENT;  Laterality: Bilateral;    Family History  Problem Relation Age of Onset   Hypertension Mother    Valvular heart disease Mother    Aortic dissection Mother    Heart failure Father    Hypertension Father    Hypertension Maternal Aunt    Breast cancer Maternal Aunt        64s   Hypertension Maternal Uncle    Breast cancer Maternal Grandmother        20s   Breast cancer Cousin        34s   Colon cancer Neg Hx    Esophageal cancer Neg Hx    Stomach cancer Neg Hx     Social History:  reports that  she has never smoked. She has never been exposed to tobacco smoke. She has never used smokeless tobacco. She reports current alcohol use. She reports that she does not use drugs.  Allergies:  Allergies  Allergen Reactions   Lisinopril  Cough    No medications prior to admission.    Review of Systems  Constitutional:  Negative for chills and fever.  Respiratory:  Negative for shortness of breath.   Cardiovascular:  Negative for chest pain, palpitations and leg swelling.  Gastrointestinal:   Negative for abdominal pain, nausea and vomiting.  Genitourinary:  Positive for pelvic pain and vaginal bleeding.  Neurological:  Negative for dizziness, weakness and headaches.  Psychiatric/Behavioral:  Negative for suicidal ideas.     Height 5' 1 (1.549 m), weight 89.8 kg, last menstrual period 03/24/2024. Physical Exam GEN: NAD CV: CTAB, RRR Abd: Pfannenstiel, soft, NTTP GU deferred MSK WNL Psych/Neuro appropriate  Results for orders placed or performed during the hospital encounter of 04/10/24 (from the past 24 hours)  CBC     Status: Abnormal   Collection Time: 04/10/24  1:10 PM  Result Value Ref Range   WBC 8.1 4.0 - 10.5 K/uL   RBC 4.07 3.87 - 5.11 MIL/uL   Hemoglobin 9.5 (L) 12.0 - 15.0 g/dL   HCT 82.9 (L) 56.2 - 13.0 %   MCV 77.6 (L) 80.0 - 100.0 fL   MCH 23.3 (L) 26.0 - 34.0 pg   MCHC 30.1 30.0 - 36.0 g/dL   RDW 86.5 (H) 78.4 - 69.6 %   Platelets 353 150 - 400 K/uL   nRBC 0.0 0.0 - 0.2 %  Type and screen [facility]     Status: None   Collection Time: 04/10/24  1:10 PM  Result Value Ref Range   ABO/RH(D) A POS    Antibody Screen NEG    Sample Expiration 04/24/2024,2359    Extend sample reason      NO TRANSFUSIONS OR PREGNANCY IN THE PAST 3 MONTHS Performed at [facility], [address] [place], [place] [address]   Basic metabolic panel per protocol     Status: Abnormal   Collection Time: 04/10/24  1:10 PM  Result Value Ref Range   Sodium 137 135 - 145 mmol/L   Potassium 3.3 (L) 3.5 - 5.1 mmol/L   Chloride 103 98 - 111 mmol/L   CO2 25 22 - 32 mmol/L   Glucose, Bld 101 (H) 70 - 99 mg/dL   BUN 11 6 - 20 mg/dL   Creatinine, Ser 4.13 (H) 0.44 - 1.00 mg/dL   Calcium 9.0 8.9 - 24.4 mg/dL   GFR, Estimated >01 >02 mL/min   Anion gap 9 5 - 15    No results found. TVUS 3/31: Uterus 9.3x6.3x5.7cm, EMS 2.97mm 1) rt/mid post mid - SM 1.8x1.9x1.7cm 2? mid post fundal IM 1.3x1.1x1.5cm 3) lf ant fundal SS 3.6x3.4x3.7cm possible endometiral  polyp 1/7c1/1cm, simple rt ov cyst 02/20/24 EMBX: MILDLY DISORDERED WEAKLY PROLIFERATIVE PATTERN ENDOMETRIUM NO EVIDENCE OF ENDOMETRIAL INTRAEPITHELIAL NEOPLASIA (ATYPICAL ENDOMETRIAL HYPERPLASIA) OR MALIGNANCY  Assessment/Plan: This is a 48yo [id number] on condos presenting for scheduled TLH/BS/cysto with perineal cyst, possible mini-lap incision. PMHx s/f CHTN and asthma. Received clearance for specialists for surgery. On preoperative exams, minimal decensus. H/o VBAC of baby A in twin pregnancy of 5lb120z. Risks of TLH include infection of the uterus, pelvic organs, or skin, inadvertent injury to internal organs, such as bowel or bladder. If there is major injury, extensive surgery may be  required. If injury is minor, it may be treated with relative ease. Discussed possibility of excessive blood loss and transfusion. Patient aware that no future fertility will remain after procedure. Patient accepts the possibility of blood transfusion, if necessary. Bowel and/or bladder injury may require prolonged inpatient stay and possible colostomy, Foley catheter, etc, as deemed fit by other surgeon. Patient understands and agrees to move forward with surgery.  Martin Slay Donatella Walski 04/10/2024, 4:42 PM

## 2024-04-11 ENCOUNTER — Ambulatory Visit (HOSPITAL_COMMUNITY)
Admission: RE | Admit: 2024-04-11 | Discharge: 2024-04-12 | Disposition: A | Discharge status: Home / Self Care | Attending: Obstetrics and Gynecology | Admitting: Obstetrics and Gynecology

## 2024-04-11 ENCOUNTER — Other Ambulatory Visit: Payer: Self-pay

## 2024-04-11 ENCOUNTER — Ambulatory Visit (HOSPITAL_COMMUNITY)

## 2024-04-11 ENCOUNTER — Encounter (HOSPITAL_COMMUNITY)
Admission: RE | Disposition: A | Discharge status: Home / Self Care | Payer: Self-pay | Source: Home / Self Care | Attending: Obstetrics and Gynecology

## 2024-04-11 ENCOUNTER — Encounter (HOSPITAL_COMMUNITY): Payer: Self-pay | Admitting: Obstetrics and Gynecology

## 2024-04-11 DIAGNOSIS — G4733 Obstructive sleep apnea (adult) (pediatric): Secondary | ICD-10-CM | POA: Insufficient documentation

## 2024-04-11 DIAGNOSIS — Z01818 Encounter for other preprocedural examination: Secondary | ICD-10-CM

## 2024-04-11 DIAGNOSIS — K660 Peritoneal adhesions (postprocedural) (postinfection): Secondary | ICD-10-CM | POA: Insufficient documentation

## 2024-04-11 DIAGNOSIS — J4520 Mild intermittent asthma, uncomplicated: Secondary | ICD-10-CM | POA: Diagnosis not present

## 2024-04-11 DIAGNOSIS — Z7951 Long term (current) use of inhaled steroids: Secondary | ICD-10-CM | POA: Diagnosis not present

## 2024-04-11 DIAGNOSIS — N858 Other specified noninflammatory disorders of uterus: Secondary | ICD-10-CM | POA: Diagnosis not present

## 2024-04-11 DIAGNOSIS — N8003 Adenomyosis of the uterus: Secondary | ICD-10-CM | POA: Insufficient documentation

## 2024-04-11 DIAGNOSIS — D259 Leiomyoma of uterus, unspecified: Secondary | ICD-10-CM | POA: Diagnosis not present

## 2024-04-11 DIAGNOSIS — N809 Endometriosis, unspecified: Secondary | ICD-10-CM | POA: Insufficient documentation

## 2024-04-11 DIAGNOSIS — I10 Essential (primary) hypertension: Secondary | ICD-10-CM | POA: Insufficient documentation

## 2024-04-11 DIAGNOSIS — D251 Intramural leiomyoma of uterus: Secondary | ICD-10-CM | POA: Insufficient documentation

## 2024-04-11 DIAGNOSIS — N898 Other specified noninflammatory disorders of vagina: Secondary | ICD-10-CM | POA: Diagnosis not present

## 2024-04-11 DIAGNOSIS — E785 Hyperlipidemia, unspecified: Secondary | ICD-10-CM | POA: Diagnosis not present

## 2024-04-11 DIAGNOSIS — N939 Abnormal uterine and vaginal bleeding, unspecified: Secondary | ICD-10-CM | POA: Diagnosis not present

## 2024-04-11 HISTORY — DX: Mild intermittent asthma, uncomplicated: J45.20

## 2024-04-11 HISTORY — PX: CYSTOSCOPY: SHX5120

## 2024-04-11 HISTORY — DX: Obstructive sleep apnea (adult) (pediatric): G47.33

## 2024-04-11 HISTORY — DX: Other seasonal allergic rhinitis: J30.2

## 2024-04-11 HISTORY — DX: Other allergy status, other than to drugs and biological substances: Z91.09

## 2024-04-11 HISTORY — DX: Family history of ischemic heart disease and other diseases of the circulatory system: Z82.49

## 2024-04-11 HISTORY — DX: Ocular albinism, unspecified: E70.319

## 2024-04-11 HISTORY — DX: Prediabetes: R73.03

## 2024-04-11 HISTORY — DX: Hyperlipidemia, unspecified: E78.5

## 2024-04-11 HISTORY — PX: TOTAL LAPAROSCOPIC HYSTERECTOMY WITH SALPINGECTOMY: SHX6742

## 2024-04-11 HISTORY — DX: Presence of spectacles and contact lenses: Z97.3

## 2024-04-11 HISTORY — DX: Abnormal uterine and vaginal bleeding, unspecified: N93.9

## 2024-04-11 HISTORY — PX: URETHRAL CYST REMOVAL: SHX5128

## 2024-04-11 HISTORY — DX: Nasal polyp, unspecified: J33.9

## 2024-04-11 HISTORY — DX: Proteinuria, unspecified: R80.9

## 2024-04-11 HISTORY — DX: Other allergic rhinitis: J30.89

## 2024-04-11 HISTORY — DX: Leiomyoma of uterus, unspecified: D25.9

## 2024-04-11 LAB — ABO/RH: ABO/RH(D): A POS

## 2024-04-11 LAB — POCT PREGNANCY, URINE: Preg Test, Ur: NEGATIVE

## 2024-04-11 SURGERY — HYSTERECTOMY, TOTAL, LAPAROSCOPIC, WITH SALPINGECTOMY
Anesthesia: General | Site: Vagina

## 2024-04-11 MED ORDER — FENTANYL CITRATE (PF) 100 MCG/2ML IJ SOLN
INTRAMUSCULAR | Status: DC | PRN
  Administered 2024-04-11 (×2): 50 ug via INTRAVENOUS

## 2024-04-11 MED ORDER — ACETAMINOPHEN 500 MG PO TABS
ORAL_TABLET | ORAL | Status: AC
  Filled 2024-04-11: qty 2

## 2024-04-11 MED ORDER — FLUORESCEIN SODIUM 10 % IV SOLN
INTRAVENOUS | Status: AC
  Filled 2024-04-11: qty 5

## 2024-04-11 MED ORDER — PHENYLEPHRINE 80 MCG/ML (10ML) SYRINGE FOR IV PUSH (FOR BLOOD PRESSURE SUPPORT)
PREFILLED_SYRINGE | INTRAVENOUS | Status: DC | PRN
  Administered 2024-04-11: 40 ug via INTRAVENOUS
  Administered 2024-04-11: 80 ug via INTRAVENOUS
  Administered 2024-04-11 (×2): 40 ug via INTRAVENOUS

## 2024-04-11 MED ORDER — PROPOFOL 10 MG/ML IV BOLUS
INTRAVENOUS | Status: AC
  Filled 2024-04-11: qty 20

## 2024-04-11 MED ORDER — ACETAMINOPHEN 500 MG PO TABS
1000.0000 mg | ORAL_TABLET | ORAL | Status: AC
  Administered 2024-04-11: 1000 mg via ORAL

## 2024-04-11 MED ORDER — ONDANSETRON HCL 4 MG/2ML IJ SOLN
INTRAMUSCULAR | Status: DC | PRN
  Administered 2024-04-11: 4 mg via INTRAVENOUS

## 2024-04-11 MED ORDER — ROCURONIUM BROMIDE 10 MG/ML (PF) SYRINGE
PREFILLED_SYRINGE | INTRAVENOUS | Status: AC
Start: 2024-04-11 — End: 2024-04-11
  Filled 2024-04-11: qty 10

## 2024-04-11 MED ORDER — LACTATED RINGERS IV SOLN: INTRAVENOUS | Status: DC

## 2024-04-11 MED ORDER — DEXMEDETOMIDINE HCL IN NACL 80 MCG/20ML IV SOLN
INTRAVENOUS | Status: DC | PRN
Start: 2024-04-11 — End: 2024-04-11
  Administered 2024-04-11: 8 ug via INTRAVENOUS

## 2024-04-11 MED ORDER — CEFAZOLIN SODIUM-DEXTROSE 2-4 GM/100ML-% IV SOLN
INTRAVENOUS | Status: AC
  Filled 2024-04-11: qty 100

## 2024-04-11 MED ORDER — CEFAZOLIN SODIUM-DEXTROSE 2-4 GM/100ML-% IV SOLN
2.0000 g | INTRAVENOUS | Status: AC
  Administered 2024-04-11: 2 g via INTRAVENOUS

## 2024-04-11 MED ORDER — MIDAZOLAM HCL 2 MG/2ML IJ SOLN
INTRAMUSCULAR | Status: AC
Start: 2024-04-11 — End: 2024-04-11
  Filled 2024-04-11: qty 2

## 2024-04-11 MED ORDER — POVIDONE-IODINE 10 % EX SWAB: 2.0000 | Freq: Once | CUTANEOUS | Status: AC

## 2024-04-11 MED ORDER — ROCURONIUM BROMIDE 10 MG/ML (PF) SYRINGE
PREFILLED_SYRINGE | INTRAVENOUS | Status: DC | PRN
  Administered 2024-04-11: 10 mg via INTRAVENOUS
  Administered 2024-04-11: 20 mg via INTRAVENOUS
  Administered 2024-04-11: 50 mg via INTRAVENOUS

## 2024-04-11 MED ORDER — ORAL CARE MOUTH RINSE: 15.0000 mL | Freq: Once | OROMUCOSAL | Status: AC

## 2024-04-11 MED ORDER — ONDANSETRON HCL 4 MG PO TABS: 4.0000 mg | ORAL_TABLET | Freq: Four times a day (QID) | ORAL | Status: DC | PRN

## 2024-04-11 MED ORDER — FENTANYL CITRATE (PF) 100 MCG/2ML IJ SOLN
INTRAMUSCULAR | Status: AC
  Filled 2024-04-11: qty 4

## 2024-04-11 MED ORDER — 0.9 % SODIUM CHLORIDE (POUR BTL) OPTIME
TOPICAL | Status: DC | PRN
  Administered 2024-04-11: 1000 mL

## 2024-04-11 MED ORDER — DOCUSATE SODIUM 100 MG PO CAPS
100.0000 mg | ORAL_CAPSULE | Freq: Two times a day (BID) | ORAL | Status: DC
  Administered 2024-04-12: 100 mg via ORAL
  Filled 2024-04-11 (×3): qty 1

## 2024-04-11 MED ORDER — CHLORHEXIDINE GLUCONATE 0.12 % MT SOLN
OROMUCOSAL | Status: AC
  Filled 2024-04-11: qty 15

## 2024-04-11 MED ORDER — OXYCODONE HCL 5 MG PO TABS
5.0000 mg | ORAL_TABLET | ORAL | Status: DC | PRN
  Administered 2024-04-12: 10 mg via ORAL
  Filled 2024-04-11: qty 2
  Filled 2024-04-11: qty 1

## 2024-04-11 MED ORDER — HYDROMORPHONE HCL 1 MG/ML IJ SOLN
INTRAMUSCULAR | Status: DC | PRN
  Administered 2024-04-11: .5 mg via INTRAVENOUS

## 2024-04-11 MED ORDER — SIMETHICONE 80 MG PO CHEW: 80.0000 mg | CHEWABLE_TABLET | Freq: Four times a day (QID) | ORAL | Status: DC | PRN

## 2024-04-11 MED ORDER — LIDOCAINE 2% (20 MG/ML) 5 ML SYRINGE
INTRAMUSCULAR | Status: DC | PRN
  Administered 2024-04-11: 50 mg via INTRAVENOUS
  Administered 2024-04-11: 100 mg via INTRAVENOUS

## 2024-04-11 MED ORDER — OXYCODONE HCL 5 MG PO TABS: 5.0000 mg | ORAL_TABLET | Freq: Four times a day (QID) | ORAL | 0 refills | Status: AC | PRN

## 2024-04-11 MED ORDER — SUGAMMADEX SODIUM 200 MG/2ML IV SOLN
INTRAVENOUS | Status: DC | PRN
Start: 2024-04-11 — End: 2024-04-11
  Administered 2024-04-11: 200 mg via INTRAVENOUS

## 2024-04-11 MED ORDER — IBUPROFEN 600 MG PO TABS
600.0000 mg | ORAL_TABLET | Freq: Four times a day (QID) | ORAL | Status: DC
Start: 2024-04-12 — End: 2024-04-12

## 2024-04-11 MED ORDER — OXYCODONE HCL 5 MG/5ML PO SOLN: 5.0000 mg | Freq: Once | ORAL | Status: DC | PRN

## 2024-04-11 MED ORDER — DEXAMETHASONE SODIUM PHOSPHATE 10 MG/ML IJ SOLN
INTRAMUSCULAR | Status: AC
  Filled 2024-04-11: qty 1

## 2024-04-11 MED ORDER — ONDANSETRON HCL 4 MG/2ML IJ SOLN
INTRAMUSCULAR | Status: AC
  Filled 2024-04-11: qty 2

## 2024-04-11 MED ORDER — CHLORHEXIDINE GLUCONATE 0.12 % MT SOLN: 15.0000 mL | Freq: Once | OROMUCOSAL | Status: AC

## 2024-04-11 MED ORDER — ESMOLOL HCL 100 MG/10ML IV SOLN
INTRAVENOUS | Status: DC | PRN
Start: 2024-04-11 — End: 2024-04-11
  Administered 2024-04-11: 30 mg via INTRAVENOUS

## 2024-04-11 MED ORDER — SODIUM CHLORIDE 0.9 % IR SOLN
Status: DC | PRN
  Administered 2024-04-11: 1000 mL

## 2024-04-11 MED ORDER — TRANEXAMIC ACID-NACL 1000-0.7 MG/100ML-% IV SOLN
1000.0000 mg | Freq: Once | INTRAVENOUS | Status: AC
  Administered 2024-04-11: 1000 mg via INTRAVENOUS
  Filled 2024-04-11: qty 100

## 2024-04-11 MED ORDER — DEXAMETHASONE SODIUM PHOSPHATE 10 MG/ML IJ SOLN
INTRAMUSCULAR | Status: DC | PRN
  Administered 2024-04-11: 10 mg via INTRAVENOUS

## 2024-04-11 MED ORDER — BUPIVACAINE HCL (PF) 0.25 % IJ SOLN
INTRAMUSCULAR | Status: DC | PRN
  Administered 2024-04-11: 10 mL

## 2024-04-11 MED ORDER — ACETAMINOPHEN 500 MG PO TABS
1000.0000 mg | ORAL_TABLET | Freq: Four times a day (QID) | ORAL | Status: DC
  Administered 2024-04-12: 1000 mg via ORAL
  Filled 2024-04-11 (×3): qty 2

## 2024-04-11 MED ORDER — KETOROLAC TROMETHAMINE 30 MG/ML IJ SOLN
30.0000 mg | Freq: Four times a day (QID) | INTRAMUSCULAR | Status: DC
  Administered 2024-04-12 (×2): 30 mg via INTRAVENOUS
  Filled 2024-04-11 (×3): qty 1

## 2024-04-11 MED ORDER — POLYETHYLENE GLYCOL 3350 17 G PO PACK: 17.0000 g | PACK | Freq: Every day | ORAL | Status: DC | PRN

## 2024-04-11 MED ORDER — SODIUM CHLORIDE (PF) 0.9 % IJ SOLN
INTRAMUSCULAR | Status: AC
  Filled 2024-04-11: qty 10

## 2024-04-11 MED ORDER — PHENYLEPHRINE 80 MCG/ML (10ML) SYRINGE FOR IV PUSH (FOR BLOOD PRESSURE SUPPORT)
PREFILLED_SYRINGE | INTRAVENOUS | Status: AC
Start: 2024-04-11 — End: 2024-04-11
  Filled 2024-04-11: qty 10

## 2024-04-11 MED ORDER — LIDOCAINE 2% (20 MG/ML) 5 ML SYRINGE
INTRAMUSCULAR | Status: AC
  Filled 2024-04-11: qty 5

## 2024-04-11 MED ORDER — DROPERIDOL 2.5 MG/ML IJ SOLN: 0.6250 mg | Freq: Once | INTRAMUSCULAR | Status: DC | PRN

## 2024-04-11 MED ORDER — DEXMEDETOMIDINE HCL IN NACL 80 MCG/20ML IV SOLN
INTRAVENOUS | Status: AC
  Filled 2024-04-11: qty 20

## 2024-04-11 MED ORDER — IBUPROFEN 800 MG PO TABS: 800.0000 mg | ORAL_TABLET | Freq: Three times a day (TID) | ORAL | 1 refills | Status: AC | PRN

## 2024-04-11 MED ORDER — SOD CITRATE-CITRIC ACID 500-334 MG/5ML PO SOLN
30.0000 mL | ORAL | Status: DC
Start: 2024-04-11 — End: 2024-04-11

## 2024-04-11 MED ORDER — OXYCODONE HCL 5 MG PO TABS: 5.0000 mg | ORAL_TABLET | Freq: Once | ORAL | Status: DC | PRN

## 2024-04-11 MED ORDER — BUPIVACAINE HCL (PF) 0.25 % IJ SOLN
INTRAMUSCULAR | Status: AC
  Filled 2024-04-11: qty 30

## 2024-04-11 MED ORDER — KETOROLAC TROMETHAMINE 30 MG/ML IJ SOLN
INTRAMUSCULAR | Status: DC | PRN
  Administered 2024-04-11: 30 mg via INTRAVENOUS

## 2024-04-11 MED ORDER — PROPOFOL 10 MG/ML IV BOLUS
INTRAVENOUS | Status: DC | PRN
  Administered 2024-04-11: 200 mg via INTRAVENOUS

## 2024-04-11 MED ORDER — ONDANSETRON HCL 4 MG/2ML IJ SOLN: 4.0000 mg | Freq: Four times a day (QID) | INTRAMUSCULAR | Status: DC | PRN

## 2024-04-11 MED ORDER — FLUORESCEIN SODIUM 10 % IV SOLN
INTRAVENOUS | Status: DC | PRN
  Administered 2024-04-11: 25 mg via INTRAVENOUS

## 2024-04-11 MED ORDER — HYDROMORPHONE HCL 1 MG/ML IJ SOLN
INTRAMUSCULAR | Status: AC
  Filled 2024-04-11: qty 0.5

## 2024-04-11 MED ORDER — MIDAZOLAM HCL 2 MG/2ML IJ SOLN
INTRAMUSCULAR | Status: DC | PRN
  Administered 2024-04-11: 2 mg via INTRAVENOUS

## 2024-04-11 MED ORDER — FENTANYL CITRATE (PF) 100 MCG/2ML IJ SOLN: 25.0000 ug | INTRAMUSCULAR | Status: DC | PRN

## 2024-04-11 SURGICAL SUPPLY — 45 items
BAG URINE DRAIN 2000ML AR STRL (UROLOGICAL SUPPLIES) IMPLANT
CATH ROBINSON RED A/P 16FR (CATHETERS) ×3 IMPLANT
COVER MAYO STAND STRL (DRAPES) ×3 IMPLANT
DERMABOND ADVANCED .7 DNX12 (GAUZE/BANDAGES/DRESSINGS) ×3 IMPLANT
DEVICE SUTURE ENDOST 10MM (ENDOMECHANICALS) IMPLANT
DRAPE SURG IRRIG POUCH 19X23 (DRAPES) ×3 IMPLANT
DURAPREP 26ML APPLICATOR (WOUND CARE) ×3 IMPLANT
ENDOSTITCH 0 SINGLE 48 (SUTURE) IMPLANT
FILTER SMOKE EVAC LAPAROSHD (FILTER) ×3 IMPLANT
GLOVE BIO SURGEON STRL SZ 6 (GLOVE) ×3 IMPLANT
GLOVE BIO SURGEON STRL SZ 6.5 (GLOVE) ×3 IMPLANT
GLOVE BIOGEL PI IND STRL 6.5 (GLOVE) ×6 IMPLANT
GLOVE SURG UNDER POLY LF SZ7 (GLOVE) ×6 IMPLANT
GOWN STRL REUS W/ TWL LRG LVL3 (GOWN DISPOSABLE) ×9 IMPLANT
HIBICLENS CHG 4% 4OZ BTL (MISCELLANEOUS) ×3 IMPLANT
IRRIGATION SUCT STRKRFLW 2 WTP (MISCELLANEOUS) ×3 IMPLANT
KIT PINK PAD W/HEAD ARM REST (MISCELLANEOUS) ×3 IMPLANT
KIT TURNOVER KIT B (KITS) ×3 IMPLANT
LHOOK LAP DISP 36CM (ELECTROSURGICAL) IMPLANT
LIGASURE VESSEL 5MM BLUNT TIP (ELECTROSURGICAL) ×3 IMPLANT
MANIPULATOR VCARE LG CRV RETR (MISCELLANEOUS) IMPLANT
MANIPULATOR VCARE SML CRV RETR (MISCELLANEOUS) IMPLANT
MANIPULATOR VCARE STD CRV RETR (MISCELLANEOUS) IMPLANT
NS IRRIG 1000ML POUR BTL (IV SOLUTION) ×3 IMPLANT
OCCLUDER COLPOPNEUMO (BALLOONS) IMPLANT
PACK LAPAROSCOPY BASIN (CUSTOM PROCEDURE TRAY) ×3 IMPLANT
PACK VAGINAL MINOR WOMEN LF (CUSTOM PROCEDURE TRAY) ×3 IMPLANT
PAD OB MATERNITY 11 LF (PERSONAL CARE ITEMS) ×3 IMPLANT
PENCIL BUTTON HOLSTER BLD 10FT (ELECTRODE) IMPLANT
SCISSORS LAP 5X35 DISP (ENDOMECHANICALS) IMPLANT
SET CYSTO W/LG BORE CLAMP LF (SET/KITS/TRAYS/PACK) ×3 IMPLANT
SET TRI-LUMEN FLTR TB AIRSEAL (TUBING) ×3 IMPLANT
SHEARS HARMONIC ACE PLUS 36CM (ENDOMECHANICALS) IMPLANT
SLEEVE Z-THREAD 5X100MM (TROCAR) IMPLANT
SURGILUBE 2OZ TUBE FLIPTOP (MISCELLANEOUS) ×3 IMPLANT
SUT ENDO VLOC 180-0-8IN (SUTURE) IMPLANT
SUT VIC AB 2-0 CT1 (SUTURE) IMPLANT
SUT VIC AB 3-0 PS2 18XBRD (SUTURE) ×3 IMPLANT
SUT VICRYL 0 UR6 27IN ABS (SUTURE) ×3 IMPLANT
SYSTEM CARTER THOMASON II (TROCAR) ×3 IMPLANT
TOWEL GREEN STERILE FF (TOWEL DISPOSABLE) ×6 IMPLANT
TRAY FOLEY W/BAG SLVR 14FR (SET/KITS/TRAYS/PACK) ×3 IMPLANT
TROCAR 11X100 Z THREAD (TROCAR) ×3 IMPLANT
TROCAR PORT AIRSEAL 5X120 (TROCAR) ×3 IMPLANT
WARMER LAPAROSCOPE (MISCELLANEOUS) ×3 IMPLANT

## 2024-04-11 NOTE — Interval H&P Note (Signed)
 History and Physical Interval Note:  04/11/2024 7:21 AM  Jillian Vasquez  has presented today for surgery, with the diagnosis of abnormal uterine bleeding.  The various methods of treatment have been discussed with the patient and family. After consideration of risks, benefits and other options for treatment, the patient has consented to  Procedure(s) with comments: HYSTERECTOMY, TOTAL, LAPAROSCOPIC, WITH SALPINGECTOMY (Bilateral) CYSTOSCOPY (N/A) EXCISION, CYST, PERIURETHRAL (N/A) - removal of perineal cyst as a surgical intervention.  The patient's history has been reviewed, patient examined, no change in status, stable for surgery.  I have reviewed the patient's chart and labs.  Questions were answered to the patient's satisfaction.     Arjuna Doeden M Afra Tricarico

## 2024-04-11 NOTE — Plan of Care (Signed)
  Problem: Pain Managment: Goal: General experience of comfort will improve and/or be controlled Outcome: Progressing   Problem: Safety: Goal: Ability to remain free from injury will improve Outcome: Progressing

## 2024-04-11 NOTE — Anesthesia Preprocedure Evaluation (Signed)
 Anesthesia Evaluation  Patient identified by MRN, date of birth, ID band Patient awake    Reviewed: Allergy & Precautions, NPO status , Patient's Chart, lab work & pertinent test results  Airway Mallampati: II  TM Distance: >3 FB Neck ROM: Full    Dental  (+) Dental Advisory Given   Pulmonary asthma , sleep apnea    breath sounds clear to auscultation       Cardiovascular hypertension, Pt. on medications  Rhythm:Regular Rate:Normal     Neuro/Psych negative neurological ROS     GI/Hepatic negative GI ROS, Neg liver ROS,,,  Endo/Other  negative endocrine ROS    Renal/GU negative Renal ROS     Musculoskeletal   Abdominal   Peds  Hematology  (+) Blood dyscrasia, anemia   Anesthesia Other Findings   Reproductive/Obstetrics                             Anesthesia Physical Anesthesia Plan  ASA: 2  Anesthesia Plan: General   Post-op Pain Management: Tylenol  PO (pre-op)* and Ketamine IV*   Induction: Intravenous  PONV Risk Score and Plan: 4 or greater and Midazolam , Dexamethasone , Scopolamine  patch - Pre-op and Ondansetron   Airway Management Planned: Oral ETT  Additional Equipment:   Intra-op Plan:   Post-operative Plan: Extubation in OR  Informed Consent: I have reviewed the patients History and Physical, chart, labs and discussed the procedure including the risks, benefits and alternatives for the proposed anesthesia with the patient or authorized representative who has indicated his/her understanding and acceptance.     Dental advisory given  Plan Discussed with: CRNA  Anesthesia Plan Comments: (2IV's, GETA, T&S)       Anesthesia Quick Evaluation

## 2024-04-11 NOTE — Plan of Care (Signed)
  Problem: Clinical Measurements: Goal: Respiratory complications will improve Outcome: Progressing   Problem: Activity: Goal: Risk for activity intolerance will decrease Outcome: Progressing   Problem: Nutrition: Goal: Adequate nutrition will be maintained Outcome: Progressing   Problem: Elimination: Goal: Will not experience complications related to urinary retention Outcome: Progressing   Problem: Pain Managment: Goal: General experience of comfort will improve and/or be controlled Outcome: Progressing

## 2024-04-11 NOTE — Progress Notes (Signed)
 POD #0  Subjective:  No acute events since surgery.  Pt denies problems with ambulating, voiding or po intake.  She denies nausea or vomiting.  Pain is well controlled.  She has not had flatus. She has not had bowel movement. Denies VB  Objective: Blood pressure 124/83, pulse 86, temperature 97.7 F (36.5 C), temperature source Oral, resp. rate 17, height 5' 1 (1.549 m), weight 88.5 kg, last menstrual period 03/24/2024, SpO2 100%.  Physical Exam:  General: alert, cooperative and no distress Lochia:normal flow Chest: CTAB Heart: RRR no m/r/g Abdomen: +BS, soft, nontender. +tympany/distention (mild) epigastrically. Lap sites x3 CDI Extremities: neg edema, neg calf TTP BL, neg Homans BL  Recent Labs    04/10/24 1310  HGB 9.5*  HCT 31.6*    Assessment/Plan:  ASSESSMENT: Jillian Vasquez is a 48 y.o. [phone] s/p TLH/BS/cysto/LOA plus vaginal cyst removal for AUB-HMB-IMB and symptomatic vaginal cyst. PMHx s/f CHTN and asthma.   -Continue PO pain meds -Encourage ambulation and incentive spirometry -Anticipate DC home POD1 -Continue home BP and asthma meds   LOS: 0 days

## 2024-04-11 NOTE — Op Note (Signed)
 Surgeon: Ethridge Herder, MD Assistant: Margaretmary Shaver, MD Preoperative Diagnosis: AUB-HMB-IMB, symptomatic vaginal mass Postoperative Diagnosis: Same as above, uterine fibroids endometriosis, vaginal cyst  Procedure: Total laparoscopic hysterectomy with bilateral salpingectomy; cystoscopy, lysis of adhesions, vaginal cyst removal Anesthesia: GETA IVF: 1300cc LR EBL: 50cc UOP: 300cc clear urine Complications: none Specimen: cervix, uterus, bilateral fallopian tubes and vaginal cyst to pathology Findings: External genital overall WNL. At 4 o clock on introitus,1x1cm fleshy tissue continuous with left vaginal wall noted. No palpable mass or fluctuance.  Laparoscopic view reveals evidence of prior BTL. Adhesions noted at vesicouterine fold from prior Csx x2, moderate. BL ovaries appear WNL.   Indications. Ms Jillian Vasquez is a 48yo G2P 1103 with AUB-HMB-IM who has failed medical management and desires surgical management in the form of hysterectomy.  TVUS showed Uterus 9.3x6.3x5.7cm, EMS 2.48mm 1) rt/mid post mid - SM 1.8x1.9x1.7cm 2? mid post fundal IM 1.3x1.1x1.5cm 3) lf ant fundal SS 3.6x3.4x3.7cm possible endometiral polyp 1/7c1/1cm, simple rt ov cyst while endometrial biopsy showed: MILDLY DISORDERED WEAKLY PROLIFERATIVE PATTERN ENDOMETRIUM NO EVIDENCE OF ENDOMETRIAL INTRAEPITHELIAL NEOPLASIA (ATYPICAL ENDOMETRIAL HYPERPLASIA) OR MALIGNANCY   Operative Procedure: The patient was taken to the operating room where a time out was performed to confirm patient and correct procedure. The patient was given 2g Ancef  in accordance with ACOG guidelines based on weight. General anesthesia was established. The patient was then positioned on the operating table in the dorsal lithotomy position with the legs supported using stirrups. All pressure points were padded and a Bair hugger was placed to maintain control of core body temperature.    The patient was then prepped and draped in the usual sterile fashion.  A Foley catheter was inserted in normal sterile fashion. An operative speculum was placed into the vagina. A tenaculum was placed on anterior cervical lip. The uterus sounded to 8.5cm and a V-care uterine manipulator was placed in normal fashion with testing of bubble prior to placement and ensuring cup was placed circumferentially around cervix.    Attention was then turned the abdomen with 0.25% marcaine  plain was injected subdermally infraumbilically. A 5mm vertical incision was made infraumbilically. A 5mm trochar and sleeve were inserted through the incision using direct visualization with laparoscope. Once intraabdominal entry was confirmed. Pneumoperitoneum was established using Airseal. Subsequently, two other small incision were made in left and right lower quadrants, both 2cm above and 2cm medial to corresponding ASIS's. LLQ site 10mm, RLQ site 5mm. Airseal port introduced in RLQ, 10mm trochar introduced in LLQ.    Attention was then turned to the right uterine fundus after laparoscopic survey showed findings as above. Evidence of prior bilateral tubal ligation was noted. Remnants of left fallopian tube were transected through mesosalpinx using Ligasure and passed off to pathology. The left broad ligament was then grasped, coagulated and cut using Ligasuse. Process was continued through mesosalpinx and utero-ovarian ligament. After using similar pattern to dissect through broad ligament, the Ligasure was used to coagulate and cut the left uterine artery. The process was then repeated on right aspect including removal of right fallopian tube prior to dissection of broad ligament in order to aid in visualization. Upon completion, uterus was thoroughly blanched and good hemostasis was noted. Bilateral cardinal ligaments were then ligated using Ligasure allowing for adequate elevation using manipulator to expose area for colpotomy.    The colpotomy incision was made using L-hook bipolar cautery and  carried out circumferentially, taking care to amputate cervix from vagina without shortening the vaginal cuff. The uterus was  then retracted into and out of the the vagina using the V-care and a vaginal occluder was placed within the vagina.    After suction-irrigation revealed excellent vaginal cuff hemostasis, 0-vicryl Endostitch was used to closed the vaginal cuff in running non-locked fashion. Good hemostasis noted upon completion. No gross evidence of injury to bladder or bowel noted. Carter-Thompson fascial closure device used to close LLQ 10mm port site after trochar was removed using 0-vicryl suture.   Remaining trochars were removed without issue and pneumoperitoneum was evacuated. Skin was closed using 4-0 Monocryl and Dermabond overlying.   At this time attention turned towards cystoscopy at which time IV fluoroscein was administered by anesthesia.  Foley catheter removed, routine cystoscopy was carried out. Bilateral efflux noted from ureters.  No suture evident within the bladder.  Bubble sign positive. Foley atheter replaced.   Examination of introitus revealed findings as above. Hemostats x2 used to clamp base of cystic tissue which was then cut and suture-ligated using 2-0 vicryl. Excess tag trimmed via Bovie. Good hemostasis, 0.25% Marcaine  administered (2cc)   The patient was transferred to recovery room in stable condition. All needle, sponge ad instrument counts were noted to be correct. x2 at end of procedure.

## 2024-04-11 NOTE — Transfer of Care (Signed)
 Immediate Anesthesia Transfer of Care Note  Patient: Billy Bue  Procedure(s) Performed: TOTAL LAPAROSCOPIC HYSTERECTOMY WITH BILATERAL SALPINGECTOMY (Bilateral: Abdomen) CYSTOSCOPY EXCISION VAGINAL CYST (Vagina )  Patient Location: PACU  Anesthesia Type:General  Level of Consciousness: drowsy  Airway & Oxygen Therapy: Patient Spontanous Breathing and Patient connected to nasal cannula oxygen  Post-op Assessment: Report given to RN, Post -op Vital signs reviewed and stable, and Patient moving all extremities  Post vital signs: Reviewed and stable  Last Vitals:  Vitals Value Taken Time  BP 111/71   Temp 97.7   Pulse 75   Resp 15   SpO2 98     Last Pain:  Vitals:   04/11/24 0608  TempSrc:   PainSc: 0-No pain      Patients Stated Pain Goal: 5 (04/11/24 1610)  Complications: No notable events documented.

## 2024-04-11 NOTE — Anesthesia Procedure Notes (Signed)
 Procedure Name: Intubation Date/Time: 04/11/2024 7:38 AM  Performed by: Trenton Frock, CRNAPre-anesthesia Checklist: Patient identified, Emergency Drugs available, Suction available and Patient being monitored Patient Re-evaluated:Patient Re-evaluated prior to induction Oxygen Delivery Method: Circle system utilized Preoxygenation: Pre-oxygenation with 100% oxygen Induction Type: IV induction Ventilation: Mask ventilation without difficulty Laryngoscope Size: Mac and 3 Grade View: Grade I Tube type: Oral Tube size: 6.5 mm Number of attempts: 1 Airway Equipment and Method: Stylet, Oral airway and Bite block Placement Confirmation: ETT inserted through vocal cords under direct vision, positive ETCO2 and breath sounds checked- equal and bilateral Secured at: 21 cm Tube secured with: Tape Dental Injury: Teeth and Oropharynx as per pre-operative assessment

## 2024-04-12 ENCOUNTER — Encounter (HOSPITAL_COMMUNITY): Payer: Self-pay | Admitting: Obstetrics and Gynecology

## 2024-04-12 DIAGNOSIS — D251 Intramural leiomyoma of uterus: Secondary | ICD-10-CM | POA: Diagnosis not present

## 2024-04-12 DIAGNOSIS — N8003 Adenomyosis of the uterus: Secondary | ICD-10-CM | POA: Diagnosis not present

## 2024-04-12 DIAGNOSIS — J4520 Mild intermittent asthma, uncomplicated: Secondary | ICD-10-CM | POA: Diagnosis not present

## 2024-04-12 DIAGNOSIS — N898 Other specified noninflammatory disorders of vagina: Secondary | ICD-10-CM | POA: Diagnosis not present

## 2024-04-12 DIAGNOSIS — G4733 Obstructive sleep apnea (adult) (pediatric): Secondary | ICD-10-CM | POA: Diagnosis not present

## 2024-04-12 DIAGNOSIS — I10 Essential (primary) hypertension: Secondary | ICD-10-CM | POA: Diagnosis not present

## 2024-04-12 DIAGNOSIS — Z7951 Long term (current) use of inhaled steroids: Secondary | ICD-10-CM | POA: Diagnosis not present

## 2024-04-12 DIAGNOSIS — N939 Abnormal uterine and vaginal bleeding, unspecified: Secondary | ICD-10-CM | POA: Diagnosis not present

## 2024-04-12 DIAGNOSIS — N809 Endometriosis, unspecified: Secondary | ICD-10-CM | POA: Diagnosis not present

## 2024-04-12 DIAGNOSIS — E785 Hyperlipidemia, unspecified: Secondary | ICD-10-CM | POA: Diagnosis not present

## 2024-04-12 DIAGNOSIS — K660 Peritoneal adhesions (postprocedural) (postinfection): Secondary | ICD-10-CM | POA: Diagnosis not present

## 2024-04-12 LAB — CBC
HCT: 25.6 % — ABNORMAL LOW (ref 36.0–46.0)
Hemoglobin: 7.8 g/dL — ABNORMAL LOW (ref 12.0–15.0)
MCH: 23.2 pg — ABNORMAL LOW (ref 26.0–34.0)
MCHC: 30.5 g/dL (ref 30.0–36.0)
MCV: 76.2 fL — ABNORMAL LOW (ref 80.0–100.0)
Platelets: 304 10*3/uL (ref 150–400)
RBC: 3.36 MIL/uL — ABNORMAL LOW (ref 3.87–5.11)
RDW: 15.9 % — ABNORMAL HIGH (ref 11.5–15.5)
WBC: 14.5 10*3/uL — ABNORMAL HIGH (ref 4.0–10.5)
nRBC: 0.1 % (ref 0.0–0.2)

## 2024-04-12 LAB — SURGICAL PATHOLOGY

## 2024-04-12 NOTE — Progress Notes (Signed)
 POD #1  Subjective:  No acute events since surgery.  Pt denies problems with ambulating, voiding or po intake.  She denies nausea or vomiting.  Pain is well controlled.  She has had flatus. She has not had bowel movement. Denies VB  Objective: Blood pressure 129/77, pulse 76, temperature 98 F (36.7 C), temperature source Oral, resp. rate 16, height 5' 1 (1.549 m), weight 88.5 kg, last menstrual period 03/24/2024, SpO2 100%.  Physical Exam:  General: alert, cooperative and no distress Lochia:normal flow Chest: CTAB Heart: RRR no m/r/g Abdomen: +BS, soft, nontender. Improved distention form yesterday epigastrically. Lap sites x3 CDI Extremities: neg edema, neg calf TTP BL, neg Homans BL  Recent Labs    04/10/24 1310 04/12/24 0625  HGB 9.5* 7.8*  HCT 31.6* 25.6*    Assessment/Plan:  ASSESSMENT: Jillian Vasquez is a 48 y.o. [phone] s/p TLH/BS/cysto/LOA plus vaginal cyst removal for AUB-HMB-IMB and symptomatic vaginal cyst. PMHx s/f CHTN and asthma.   -Continue PO pain meds -Encourage ambulation and incentive spirometry -Anticipate DC home today -Continue home BP and asthma meds   LOS: 0 days

## 2024-04-12 NOTE — Anesthesia Postprocedure Evaluation (Signed)
 Anesthesia Post Note  Patient: Jillian Vasquez  Procedure(s) Performed: TOTAL LAPAROSCOPIC HYSTERECTOMY WITH BILATERAL SALPINGECTOMY (Bilateral: Abdomen) CYSTOSCOPY EXCISION VAGINAL CYST (Vagina )     Patient location during evaluation: PACU Anesthesia Type: General Level of consciousness: awake and alert Pain management: pain level controlled Vital Signs Assessment: post-procedure vital signs reviewed and stable Respiratory status: spontaneous breathing, nonlabored ventilation, respiratory function stable and patient connected to nasal cannula oxygen Cardiovascular status: blood pressure returned to baseline and stable Postop Assessment: no apparent nausea or vomiting Anesthetic complications: no   No notable events documented.  Last Vitals:  Vitals:   04/12/24 0415 04/12/24 0743  BP: 128/83 129/77  Pulse: 87 76  Resp: 18 16  Temp: 37.4 C 36.7 C  SpO2: 100% 100%    Last Pain:  Vitals:   04/12/24 0743  TempSrc: Oral  PainSc:                  Jazell Rosenau E

## 2024-04-12 NOTE — Plan of Care (Signed)

## 2024-04-12 NOTE — Discharge Summary (Signed)
 Physician Discharge Summary  Patient ID: Jillian Vasquez MRN: [id number] DOB/AGE: October 07, 1976 48 y.o.  Admit date: 04/11/2024 Discharge date: 04/12/2024  Admission Diagnoses:  Discharge Diagnoses:  Principal Problem:   Abnormal uterine bleeding (AUB)   Discharged Condition: good  Hospital Course: Admitted for scheduled TLH/BS/cysto with lysis of adhesions and vaginal cyst removal for AUB-HMB-IMB and symptomatic cyst. Uncomplicated procedure, please see operative note for full details. By POD#1, ambulating, tolerating PO, voiding, pain controlled on PO meds. Discharged home in stable fashion with routine precautions Discharge Exam: Blood pressure 129/77, pulse 76, temperature 98 F (36.7 C), temperature source Oral, resp. rate 16, height 5' 1 (1.549 m), weight 88.5 kg, last menstrual period 03/24/2024, SpO2 100%. General: alert, cooperative and no distress Lochia:normal flow Chest: CTAB Heart: RRR no m/r/g Abdomen: +BS, soft, nontender. Improved distention form yesterday epigastrically. Lap sites x3 CDI Extremities: neg edema, neg calf TTP BL, neg Homans BL Disposition: Discharge disposition: 01-Home or Self Care       Discharge Instructions     Call MD for:  difficulty breathing, headache or visual disturbances   Complete by: As directed    Call MD for:  hives   Complete by: As directed    Call MD for:  persistant dizziness or light-headedness   Complete by: As directed    Call MD for:  persistant nausea and vomiting   Complete by: As directed    Call MD for:  redness, tenderness, or signs of infection (pain, swelling, redness, odor or green/yellow discharge around incision site)   Complete by: As directed    Call MD for:  severe uncontrolled pain   Complete by: As directed    Call MD for:  temperature >100.4   Complete by: As directed    Diet - low sodium heart healthy   Complete by: As directed    Increase activity slowly   Complete by: As directed     Lifting restrictions   Complete by: As directed    No >15lbs   Sexual Activity Restrictions   Complete by: As directed    None for 8 weeks      Allergies as of 04/12/2024       Reactions   Zestril  [lisinopril ] Cough        Medication List     TAKE these medications    albuterol  108 (90 Base) MCG/ACT inhaler Commonly known as: VENTOLIN  HFA Inhale 1-2 puffs into the lungs every 6 (six) hours as needed for wheezing or shortness of breath.   amLODipine  10 MG tablet Commonly known as: NORVASC  Take 1 tablet (10 mg total) by mouth daily.   budesonide -formoterol  160-4.5 MCG/ACT inhaler Commonly known as: Symbicort  Inhale 2 puffs into the lungs 2 (two) times daily. What changed: additional instructions   Dupixent  300 MG/2ML prefilled syringe Generic drug: dupilumab  INJECT 300 MG UNDER THE SKIN EVERY 2 WEEKS (MAINTENANCE DOSE) What changed: See the new instructions.   ibuprofen  800 MG tablet Commonly known as: ADVIL  Take 1 tablet (800 mg total) by mouth every 8 (eight) hours as needed.   losartan -hydrochlorothiazide  50-12.5 MG tablet Commonly known as: Hyzaar Take 1 tablet by mouth daily.   oxyCODONE  5 MG immediate release tablet Commonly known as: Oxy IR/ROXICODONE  Take 1 tablet (5 mg total) by mouth every 6 (six) hours as needed for severe pain (pain score 7-10).        Follow-up Information     Shir Bergman, Martin Slay, MD. Go to.   Specialty: Obstetrics  and Gynecology Why: postop appts at 2 and 8 wks Contact information: [address] [place] [address] [phone]                 Signed: Martin Slay [facility] 04/12/2024, 8:22 AM

## 2024-05-10 DIAGNOSIS — G4733 Obstructive sleep apnea (adult) (pediatric): Secondary | ICD-10-CM | POA: Diagnosis not present

## 2024-05-14 ENCOUNTER — Encounter: Payer: Self-pay | Admitting: Family Medicine

## 2024-05-14 ENCOUNTER — Ambulatory Visit: Admitting: Family Medicine

## 2024-05-14 VITALS — BP 135/86 | HR 73 | Ht 61.0 in | Wt 197.8 lb

## 2024-05-14 DIAGNOSIS — F4320 Adjustment disorder, unspecified: Secondary | ICD-10-CM

## 2024-05-14 DIAGNOSIS — G44209 Tension-type headache, unspecified, not intractable: Secondary | ICD-10-CM

## 2024-05-14 DIAGNOSIS — I10 Essential (primary) hypertension: Secondary | ICD-10-CM

## 2024-05-14 DIAGNOSIS — Z23 Encounter for immunization: Secondary | ICD-10-CM

## 2024-05-14 MED ORDER — SHINGRIX 50 MCG/0.5ML IM SUSR: INTRAMUSCULAR | 1 refills | Status: AC

## 2024-05-14 NOTE — Patient Instructions (Addendum)
 It was wonderful to see you today.  Please bring ALL of your medications with you to every visit.   Today we talked about:  Hypertension - Your blood pressure looked good today! Please note down your daily blood pressure on the log below and have it ready to show Dr. Delores at your follow up.   Headaches - I think these are tension headaches related to stress vs when you might fall asleep without your CPAP.  You can take tylenol  for these headaches, but I also want you to work on doing at least 2 minutes of guided meditation daily. You can find these on spotify or youtube.   Otherwise please track on a calendar every day you do at least 10 minutes of exercise. This can be weights, treadmill, stretching.   Please follow up in 1 month with Dr. Delores   Thank you for choosing [facility].   Please call [phone] with any questions about today's appointment.  Areta Saliva, MD  Family Medicine

## 2024-05-14 NOTE — Assessment & Plan Note (Signed)
 BP well controlled at this time.  - Track home blood pressure in log  - Continue hyzaar and amlodipine 

## 2024-05-14 NOTE — Progress Notes (Signed)
    SUBJECTIVE:   CHIEF COMPLAINT / HPI:   Hypertension  Patient was concerned her BP was high at her last visit and says that she thinks it was high after her recent gyn surgery.  Patient has been adherent with her hyzaar and amlodipine . Denies any adverse effects with medication.  Says that she wants to exercise more to control her blood pressure and prediabetes. She has a home gym and has very supportive daughters. However, she has had decreased motivation recently. Her mood has been worse as she is coming up on the one year anniversary of her sister's death.   Headaches  Patient reports mild nagging headaches for the last 2-3 weeks. She says that she is woken up around 3-4 AM with a headache at the base of her neck and as a band along her forehead. The headache does not last into the day. This happens a couple times a week. She will sometimes take a tylenol  when she has symptom onset to relieve the pain. Has noticed that this happens sometimes when she falls asleep in her movie room and was not wearing her CPAP. Otherwise regularly wears her CPAP when she sleeps in her bedroom overnight. She has also been a lot more stressed and has noticed increased tension in her neck and shoulders.  Denies vision changes, balance issues, weakness.  Denies allergies.   Grief/Anxiety  Patient has had increased grief and racing thoughts as she nears the death anniversary of her sister. She says her daughters have been very supportive and she has been seeing a Camera operator. She endorses racing thoughts. Her sister died of colon cancer and sometimes patient feels guilty she did not push more. Says that when she does exercise it does help her mood.   PERTINENT  PMH / PSH: HTN, Prediabetes, OSA   OBJECTIVE:   BP 135/86   Pulse 73   Ht 5' 1 (1.549 m)   Wt 197 lb 12.8 oz (89.7 kg)   SpO2 99%   BMI 37.37 kg/m   General: well appearing, in NAD HEENT: no tenderness over sinuses or over TMJ CV: well  perfused  Resp: normal work of breathing on room air  Neuro: PERRLA, EOMI, CN 2-12 intact, sensation intact throughout, strength BUE 5/5, strength BLE 5/5, normal gait  MSK: no midline cervical tenderness, trapezius muscles hypertrophied and tender bilaterally.  Full range of motion of neck intact  Psyc: tearful while discussing death of her sister, otherwise able to engage in conversation and very pleasant   ASSESSMENT/PLAN:   Assessment & Plan Essential hypertension BP well controlled at this time.  - Track home blood pressure in log  - Continue hyzaar and amlodipine   Tension headache Headaches most likely tension type from occasionally forgetting her CPAP and recent increase in stress. No red flag symptoms and HEENT and neuro exam unremarkable.  - Encouraged CPAP use for naps  - Counseled regarding some relaxation techniques and guided medication.  - Tylenol  as needed  - Follow up in one month  Grief reaction Patient experiencing normal grief reaction. She has good support through her daughters and her grief counselor.  - Discussed speaking with her niece as she feels guilty not calling her  - Discussed guided meditation and exercise at least 10 minutes a  day      Areta Saliva, MD [facility]

## 2024-06-10 DIAGNOSIS — G4733 Obstructive sleep apnea (adult) (pediatric): Secondary | ICD-10-CM | POA: Diagnosis not present

## 2024-06-14 NOTE — Progress Notes (Signed)
 [facility] Telemedicine Visit  Patient consented to have virtual visit and was identified by name and date of birth. Method of visit: Telephone  Encounter participants: Patient: Rameen Gohlke - located at place of work  Provider: Suzann CHRISTELLA Daring - located at office   Chief Complaint: lifestyle changes  HPI:  Hanifa Antonetti Mattea Seger is a 81 with HTN presenting for follow up.   Grief Anniversary of sister's death is upcoming. She is working through and has been adjustment.   Hysterectomy  Healing well. No bleeding. Off all narcotics   HTN BP has been better. Has had no more headaches. No chest pain or dyspnea. BP values at home at goal.    ROS: per HPI  Pertinent PMHx: HTN, adenomyosis s/p hysterectomy   Exam:  Today's Vitals   06/17/24 0834  BP: 130/80   There is no height or weight on file to calculate BMI. Speaking in full sentences   Assessment/Plan:  Assessment & Plan Essential hypertension At goal at home Symptoms improved Using CPAP  Mood change Slowly improving Has support, no changes today  History of hysterectomy CBC at follow up Discontinue iron  supplement  Discussed upcoming HCM items including flu shot She will return this fall for in person visit   Time spent during visit with patient: 6 minutes

## 2024-06-17 ENCOUNTER — Encounter: Payer: Self-pay | Admitting: Family Medicine

## 2024-06-17 ENCOUNTER — Telehealth (INDEPENDENT_AMBULATORY_CARE_PROVIDER_SITE_OTHER): Admitting: Family Medicine

## 2024-06-17 VITALS — BP 130/80

## 2024-06-17 DIAGNOSIS — Z90710 Acquired absence of both cervix and uterus: Secondary | ICD-10-CM | POA: Diagnosis not present

## 2024-06-17 DIAGNOSIS — I10 Essential (primary) hypertension: Secondary | ICD-10-CM | POA: Diagnosis not present

## 2024-06-17 DIAGNOSIS — R4586 Emotional lability: Secondary | ICD-10-CM | POA: Diagnosis not present

## 2024-06-17 NOTE — Assessment & Plan Note (Signed)
 At goal at home Symptoms improved Using CPAP

## 2024-07-11 DIAGNOSIS — G4733 Obstructive sleep apnea (adult) (pediatric): Secondary | ICD-10-CM | POA: Diagnosis not present

## 2024-07-17 ENCOUNTER — Other Ambulatory Visit: Payer: Self-pay

## 2024-07-17 MED ORDER — AMLODIPINE BESYLATE 10 MG PO TABS: 10.0000 mg | ORAL_TABLET | Freq: Every day | ORAL | 3 refills | Status: AC

## 2024-08-07 ENCOUNTER — Emergency Department (HOSPITAL_BASED_OUTPATIENT_CLINIC_OR_DEPARTMENT_OTHER)
Admission: EM | Admit: 2024-08-07 | Discharge: 2024-08-07 | Disposition: A | Discharge status: Home / Self Care | Attending: Emergency Medicine | Admitting: Emergency Medicine

## 2024-08-07 ENCOUNTER — Encounter (HOSPITAL_BASED_OUTPATIENT_CLINIC_OR_DEPARTMENT_OTHER): Payer: Self-pay

## 2024-08-07 ENCOUNTER — Emergency Department (HOSPITAL_BASED_OUTPATIENT_CLINIC_OR_DEPARTMENT_OTHER): Admitting: Radiology

## 2024-08-07 ENCOUNTER — Other Ambulatory Visit: Payer: Self-pay

## 2024-08-07 DIAGNOSIS — R42 Dizziness and giddiness: Secondary | ICD-10-CM

## 2024-08-07 DIAGNOSIS — Z79899 Other long term (current) drug therapy: Secondary | ICD-10-CM | POA: Diagnosis not present

## 2024-08-07 DIAGNOSIS — R55 Syncope and collapse: Secondary | ICD-10-CM | POA: Diagnosis not present

## 2024-08-07 DIAGNOSIS — I10 Essential (primary) hypertension: Secondary | ICD-10-CM | POA: Insufficient documentation

## 2024-08-07 DIAGNOSIS — R202 Paresthesia of skin: Secondary | ICD-10-CM | POA: Diagnosis not present

## 2024-08-07 DIAGNOSIS — R110 Nausea: Secondary | ICD-10-CM | POA: Diagnosis not present

## 2024-08-07 DIAGNOSIS — Z8673 Personal history of transient ischemic attack (TIA), and cerebral infarction without residual deficits: Secondary | ICD-10-CM | POA: Insufficient documentation

## 2024-08-07 LAB — BASIC METABOLIC PANEL WITH GFR
Anion gap: 11 (ref 5–15)
BUN: 17 mg/dL (ref 6–20)
CO2: 26 mmol/L (ref 22–32)
Calcium: 9.6 mg/dL (ref 8.9–10.3)
Chloride: 101 mmol/L (ref 98–111)
Creatinine, Ser: 1.24 mg/dL — ABNORMAL HIGH (ref 0.44–1.00)
GFR, Estimated: 54 mL/min — ABNORMAL LOW (ref >60)
Glucose, Bld: 120 mg/dL — ABNORMAL HIGH (ref 70–99)
Potassium: 3.6 mmol/L (ref 3.5–5.1)
Sodium: 138 mmol/L (ref 135–145)

## 2024-08-07 LAB — TROPONIN T, HIGH SENSITIVITY: Troponin T High Sensitivity: <15 ng/L (ref 0–19)

## 2024-08-07 LAB — CBC
HCT: 37.7 % (ref 36.0–46.0)
Hemoglobin: 11.5 g/dL — ABNORMAL LOW (ref 12.0–15.0)
MCH: 22.2 pg — ABNORMAL LOW (ref 26.0–34.0)
MCHC: 30.5 g/dL (ref 30.0–36.0)
MCV: 72.6 fL — ABNORMAL LOW (ref 80.0–100.0)
Platelets: 251 K/uL (ref 150–400)
RBC: 5.19 MIL/uL — ABNORMAL HIGH (ref 3.87–5.11)
RDW: 20 % — ABNORMAL HIGH (ref 11.5–15.5)
WBC: 5.2 K/uL (ref 4.0–10.5)
nRBC: 0 % (ref 0.0–0.2)

## 2024-08-07 LAB — MAGNESIUM: Magnesium: 2 mg/dL (ref 1.7–2.4)

## 2024-08-07 MED ORDER — LACTATED RINGERS IV BOLUS
1000.0000 mL | Freq: Once | INTRAVENOUS | Status: AC
  Administered 2024-08-07: 1000 mL via INTRAVENOUS

## 2024-08-07 NOTE — Discharge Instructions (Addendum)
 You were seen for feeling lightheaded and numbness and tingling in the emergency department.  It is likely from a vasovagal event.  At home, please take some time to rest and rehydrate.    Check your MyChart online for the results of any tests that had not resulted by the time you left the emergency department.   Follow-up with your primary doctor in 2-3 days regarding your visit.    Return immediately to the emergency department if you experience any of the following: Fainting, worsening arm pain or numbness, chest pain, or any other concerning symptoms.    Thank you for visiting our Emergency Department. It was a pleasure taking care of you today.    Vasovagal Event A vasovagal episode or vasovagal syncope is the most common form of reflex syncope. Reflex syncope describes any form of syncopal episode caused by a failure in the autoregulation of blood pressure, and ultimately, a drop in cerebral perfusion pressure resulting in a transient loss of consciousness. The mechanisms responsible for this are complex and can both depression of cardiac output as well as decreased vascular tone. Other types of reflex syncope include carotid sinus syncope and situational syncope, the latter of which may occur, for instance, in conjunction with a cough or micturition. Vasovagal syncope may be triggered by pain or emotional upset, although frequently a specific trigger cannot be identified.

## 2024-08-07 NOTE — ED Provider Notes (Signed)
 [facility] EMERGENCY DEPARTMENT AT [facility] Provider Note   CSN: [id number] Arrival date & time: 08/07/24  1016     Patient presents with: Tingling   Jillian Vasquez is a 48 y.o. female.  {Add pertinent medical, surgical, social history, OB history to HPI:1311} 48 year old female who presents to the emergency department with lightheadedness, nausea, flushing and tingling in her left arm.  Reports that she was at work when she started experiencing the symptoms.  Started feeling very weak.  Felt as though she was going to pass out and felt very warm.  Had some nausea associated with it but drank some water and it resolved.  Says that she has a pins and needle sensation in her left arm as well.  No headache.  No chest pain.  No shortness of breath.       Prior to Admission medications   Medication Sig Start Date End Date Taking? Authorizing Provider  albuterol  (VENTOLIN  HFA) 108 (90 Base) MCG/ACT inhaler Inhale 1-2 puffs into the lungs every 6 (six) hours as needed for wheezing or shortness of breath.    [provider]  amLODipine  (NORVASC ) 10 MG tablet Take 1 tablet (10 mg total) by mouth daily. 07/17/24   Delores Suzann HERO, MD  budesonide -formoterol  (SYMBICORT ) 160-4.5 MCG/ACT inhaler Inhale 2 puffs into the lungs 2 (two) times daily. 01/25/24   Ambs, Arlean HERO, FNP  DUPIXENT  300 MG/2ML prefilled syringe INJECT 300 MG UNDER THE SKIN EVERY 2 WEEKS (MAINTENANCE DOSE) 09/11/23   Iva Marty Saltness, MD  ibuprofen  (ADVIL ) 800 MG tablet Take 1 tablet (800 mg total) by mouth every 8 (eight) hours as needed. 04/11/24   Shivaji, Lavonia HERO, MD  losartan -hydrochlorothiazide  (HYZAAR) 50-12.5 MG tablet Take 1 tablet by mouth daily. 01/22/24   Delores Suzann HERO, MD  oxyCODONE  (OXY IR/ROXICODONE ) 5 MG immediate release tablet Take 1 tablet (5 mg total) by mouth every 6 (six) hours as needed for severe pain (pain score 7-10). 04/11/24   Shivaji, Lavonia HERO, MD  Zoster Vaccine  Adjuvanted (SHINGRIX ) injection Administer Shingrix  vaccination now and repeat in two months 05/14/24   Nicholas Bar, MD    Allergies: Zestril  [lisinopril ]    Review of Systems  Updated Vital Signs BP (!) 156/93   Pulse 64   Temp 98.2 F (36.8 C) (Oral)   Resp 17   Ht 5' 1 (1.549 m)   Wt 89.8 kg   LMP 03/24/2024 (Exact Date) Comment: DOS UPREG NEGATIVE  SpO2 99%   BMI 37.41 kg/m   Physical Exam Vitals and nursing note reviewed.  Constitutional:      General: She is not in acute distress.    Appearance: She is well-developed.  HENT:     Head: Normocephalic and atraumatic.     Right Ear: External ear normal.     Left Ear: External ear normal.     Nose: Nose normal.  Eyes:     Extraocular Movements: Extraocular movements intact.     Conjunctiva/sclera: Conjunctivae normal.     Pupils: Pupils are equal, round, and reactive to light.  Cardiovascular:     Rate and Rhythm: Normal rate and regular rhythm.     Heart sounds: No murmur heard.    Comments: Radial pulses 2+ bilaterally Pulmonary:     Effort: Pulmonary effort is normal. No respiratory distress.     Breath sounds: Normal breath sounds.  Musculoskeletal:     Cervical back: Normal range of motion and neck supple.  Right lower leg: No edema.     Left lower leg: No edema.  Skin:    General: Skin is warm and dry.  Neurological:     General: No focal deficit present.     Mental Status: She is alert and oriented to person, place, and time. Mental status is at baseline.     Cranial Nerves: No cranial nerve deficit.     Sensory: No sensory deficit.     Motor: No weakness.  Psychiatric:        Mood and Affect: Mood normal.     (all labs ordered are listed, but only abnormal results are displayed) Labs Reviewed  BASIC METABOLIC PANEL WITH GFR  CBC  MAGNESIUM  TROPONIN T, HIGH SENSITIVITY    EKG: None  Radiology: No results found.  {Document cardiac monitor, telemetry assessment procedure when  appropriate:32947} Procedures   Medications Ordered in the ED  lactated ringers  bolus 1,000 mL (has no administration in time range)      {Click here for ABCD2, HEART and other calculators REFRESH Note before signing:1}                              Medical Decision Making Amount and/or Complexity of Data Reviewed Labs: ordered. Radiology: ordered.   ***  {Document critical care time when appropriate  Document review of labs and clinical decision tools ie CHADS2VASC2, etc  Document your independent review of radiology images and any outside records  Document your discussion with family members, caretakers and with consultants  Document social determinants of health affecting pt's care  Document your decision making why or why not admission, treatments were needed:32947:::1}   Final diagnoses:  None    ED Discharge Orders     None

## 2024-08-07 NOTE — ED Triage Notes (Signed)
 Was at work and started feeling hot and nauseated. She then started tingling in left arm and the tingling has continued. Denies chest pain, SOB, vomiting. Nausea resolved after drinking water.

## 2024-09-30 ENCOUNTER — Other Ambulatory Visit: Payer: Self-pay | Admitting: *Deleted

## 2024-09-30 MED ORDER — DUPIXENT 300 MG/2ML ~~LOC~~ SOSY: 300.0000 mg | PREFILLED_SYRINGE | SUBCUTANEOUS | 11 refills | Status: AC

## 2024-09-30 NOTE — Telephone Encounter (Signed)
 Patient called and request Dupixent  to clinic and I advised her to call [facility] and reorder same to clinic

## 2024-10-31 ENCOUNTER — Ambulatory Visit: Admitting: *Deleted

## 2024-10-31 DIAGNOSIS — J4540 Moderate persistent asthma, uncomplicated: Secondary | ICD-10-CM | POA: Diagnosis not present

## 2024-10-31 MED ORDER — DUPILUMAB 300 MG/2ML ~~LOC~~ SOSY
300.0000 mg | PREFILLED_SYRINGE | SUBCUTANEOUS | Status: AC
  Administered 2024-10-31 – 2024-11-14 (×2): 300 mg via SUBCUTANEOUS

## 2024-10-31 NOTE — Progress Notes (Signed)
 Patient has been receiving her Dupixent  injections every 2 weeks at home administered by her husband. Now that he is on the road he is not able to administer them and the patient prefers to have someone else give them. Patient brought in her medication today and was given a 300mg  dose of Dppixent in the [facility]. Patient left without waiting per protocol since she has been receiving these injections at home.

## 2024-11-14 ENCOUNTER — Ambulatory Visit

## 2024-11-14 DIAGNOSIS — J4540 Moderate persistent asthma, uncomplicated: Secondary | ICD-10-CM | POA: Diagnosis not present

## 2024-12-03 ENCOUNTER — Ambulatory Visit
